# Patient Record
Sex: Female | Born: 1940 | Race: White | Hispanic: No | Marital: Married | Smoking: Never smoker
Health system: Southern US, Community
[De-identification: ages and names within clinical notes are randomized; demographics above are authoritative.]

## PROBLEM LIST (undated history)

## (undated) DIAGNOSIS — E785 Hyperlipidemia, unspecified: Secondary | ICD-10-CM

## (undated) DIAGNOSIS — I10 Essential (primary) hypertension: Secondary | ICD-10-CM

## (undated) DIAGNOSIS — C801 Malignant (primary) neoplasm, unspecified: Secondary | ICD-10-CM

## (undated) DIAGNOSIS — M549 Dorsalgia, unspecified: Secondary | ICD-10-CM

## (undated) DIAGNOSIS — I639 Cerebral infarction, unspecified: Secondary | ICD-10-CM

## (undated) DIAGNOSIS — G4700 Insomnia, unspecified: Secondary | ICD-10-CM

## (undated) DIAGNOSIS — K649 Unspecified hemorrhoids: Secondary | ICD-10-CM

## (undated) DIAGNOSIS — E119 Type 2 diabetes mellitus without complications: Secondary | ICD-10-CM

## (undated) DIAGNOSIS — Z9181 History of falling: Secondary | ICD-10-CM

## (undated) HISTORY — DX: Malignant (primary) neoplasm, unspecified: C80.1

## (undated) HISTORY — DX: Insomnia, unspecified: G47.00

## (undated) HISTORY — DX: Dorsalgia, unspecified: M54.9

## (undated) HISTORY — PX: BLADDER SUSPENSION: SHX72

## (undated) HISTORY — PX: ABDOMINAL HYSTERECTOMY: SHX81

## (undated) HISTORY — DX: Unspecified hemorrhoids: K64.9

## (undated) HISTORY — DX: Hyperlipidemia, unspecified: E78.5

## (undated) HISTORY — DX: History of falling: Z91.81

---

## 1995-05-20 ENCOUNTER — Encounter: Payer: Self-pay | Admitting: Family Medicine

## 1997-07-21 ENCOUNTER — Ambulatory Visit: Admission: RE | Admit: 1997-07-21 | Discharge: 1997-07-21 | Payer: Self-pay | Admitting: Family Medicine

## 2001-11-12 ENCOUNTER — Encounter: Payer: Self-pay | Admitting: Emergency Medicine

## 2001-11-12 ENCOUNTER — Emergency Department (HOSPITAL_COMMUNITY): Admission: EM | Admit: 2001-11-12 | Discharge: 2001-11-13 | Payer: Self-pay | Admitting: Emergency Medicine

## 2002-01-02 ENCOUNTER — Emergency Department (HOSPITAL_COMMUNITY): Admission: EM | Admit: 2002-01-02 | Discharge: 2002-01-03 | Payer: Self-pay | Admitting: Emergency Medicine

## 2002-01-03 ENCOUNTER — Encounter: Payer: Self-pay | Admitting: Emergency Medicine

## 2002-01-04 ENCOUNTER — Emergency Department (HOSPITAL_COMMUNITY): Admission: EM | Admit: 2002-01-04 | Discharge: 2002-01-04 | Payer: Self-pay | Admitting: Emergency Medicine

## 2004-01-24 ENCOUNTER — Ambulatory Visit: Payer: Self-pay | Admitting: Family Medicine

## 2004-02-15 ENCOUNTER — Ambulatory Visit: Payer: Self-pay | Admitting: Family Medicine

## 2004-04-22 ENCOUNTER — Emergency Department (HOSPITAL_COMMUNITY): Admission: EM | Admit: 2004-04-22 | Discharge: 2004-04-22 | Payer: Self-pay | Admitting: Family Medicine

## 2004-04-25 ENCOUNTER — Ambulatory Visit: Payer: Self-pay | Admitting: Internal Medicine

## 2004-06-14 ENCOUNTER — Ambulatory Visit: Payer: Self-pay | Admitting: Family Medicine

## 2004-07-13 ENCOUNTER — Ambulatory Visit: Payer: Self-pay | Admitting: Family Medicine

## 2004-10-11 ENCOUNTER — Ambulatory Visit: Payer: Self-pay | Admitting: Family Medicine

## 2005-11-05 ENCOUNTER — Ambulatory Visit: Payer: Self-pay | Admitting: Family Medicine

## 2005-11-05 LAB — CONVERTED CEMR LAB: Hgb A1c MFr Bld: 9.5 %

## 2006-03-18 ENCOUNTER — Ambulatory Visit: Payer: Self-pay | Admitting: Family Medicine

## 2006-06-17 ENCOUNTER — Ambulatory Visit: Payer: Self-pay | Admitting: Family Medicine

## 2006-06-17 DIAGNOSIS — T887XXA Unspecified adverse effect of drug or medicament, initial encounter: Secondary | ICD-10-CM | POA: Insufficient documentation

## 2006-06-17 DIAGNOSIS — E119 Type 2 diabetes mellitus without complications: Secondary | ICD-10-CM | POA: Insufficient documentation

## 2006-06-19 LAB — CONVERTED CEMR LAB: ALT: 19 units/L (ref 0–40)

## 2006-06-27 ENCOUNTER — Telehealth (INDEPENDENT_AMBULATORY_CARE_PROVIDER_SITE_OTHER): Payer: Self-pay | Admitting: *Deleted

## 2006-11-13 ENCOUNTER — Telehealth: Payer: Self-pay | Admitting: Internal Medicine

## 2006-11-17 ENCOUNTER — Encounter: Payer: Self-pay | Admitting: Family Medicine

## 2006-11-17 DIAGNOSIS — Z8679 Personal history of other diseases of the circulatory system: Secondary | ICD-10-CM | POA: Insufficient documentation

## 2006-11-17 DIAGNOSIS — I10 Essential (primary) hypertension: Secondary | ICD-10-CM | POA: Insufficient documentation

## 2006-11-17 DIAGNOSIS — E785 Hyperlipidemia, unspecified: Secondary | ICD-10-CM | POA: Insufficient documentation

## 2006-11-17 DIAGNOSIS — M549 Dorsalgia, unspecified: Secondary | ICD-10-CM | POA: Insufficient documentation

## 2006-11-25 ENCOUNTER — Ambulatory Visit: Payer: Self-pay | Admitting: Family Medicine

## 2006-11-26 LAB — CONVERTED CEMR LAB
ALT: 24 units/L (ref 0–35)
BUN: 13 mg/dL (ref 6–23)
Basophils Absolute: 0 10*3/uL (ref 0.0–0.1)
Calcium: 9.9 mg/dL (ref 8.4–10.5)
Eosinophils Absolute: 0.1 10*3/uL (ref 0.0–0.6)
Eosinophils Relative: 2.1 % (ref 0.0–5.0)
GFR calc Af Amer: 129 mL/min
GFR calc non Af Amer: 107 mL/min
Hgb A1c MFr Bld: 9.8 % — ABNORMAL HIGH (ref 4.6–6.0)
MCHC: 33.8 g/dL (ref 30.0–36.0)
MCV: 80.1 fL (ref 78.0–100.0)
Platelets: 169 10*3/uL (ref 150–400)
Potassium: 4.4 meq/L (ref 3.5–5.1)
RBC: 4.78 M/uL (ref 3.87–5.11)
Total CHOL/HDL Ratio: 5.2
Triglycerides: 148 mg/dL (ref 0–149)
WBC: 5.3 10*3/uL (ref 4.5–10.5)

## 2006-12-02 ENCOUNTER — Encounter: Admission: RE | Admit: 2006-12-02 | Discharge: 2006-12-02 | Payer: Self-pay | Admitting: Family Medicine

## 2006-12-08 ENCOUNTER — Encounter: Payer: Self-pay | Admitting: Family Medicine

## 2006-12-11 ENCOUNTER — Telehealth (INDEPENDENT_AMBULATORY_CARE_PROVIDER_SITE_OTHER): Payer: Self-pay | Admitting: *Deleted

## 2006-12-17 ENCOUNTER — Ambulatory Visit (HOSPITAL_COMMUNITY): Admission: RE | Admit: 2006-12-17 | Discharge: 2006-12-17 | Payer: Self-pay | Admitting: Family Medicine

## 2006-12-23 ENCOUNTER — Encounter: Admission: RE | Admit: 2006-12-23 | Discharge: 2006-12-23 | Payer: Self-pay | Admitting: Family Medicine

## 2006-12-26 ENCOUNTER — Encounter (INDEPENDENT_AMBULATORY_CARE_PROVIDER_SITE_OTHER): Payer: Self-pay | Admitting: *Deleted

## 2007-01-06 ENCOUNTER — Encounter: Payer: Self-pay | Admitting: Family Medicine

## 2007-01-27 ENCOUNTER — Encounter: Admission: RE | Admit: 2007-01-27 | Discharge: 2007-03-03 | Payer: Self-pay | Admitting: Family Medicine

## 2007-01-27 ENCOUNTER — Encounter: Payer: Self-pay | Admitting: Family Medicine

## 2007-02-19 DIAGNOSIS — I639 Cerebral infarction, unspecified: Secondary | ICD-10-CM

## 2007-02-19 HISTORY — PX: HIP ARTHROPLASTY: SHX981

## 2007-02-19 HISTORY — DX: Cerebral infarction, unspecified: I63.9

## 2007-03-03 ENCOUNTER — Encounter: Payer: Self-pay | Admitting: Family Medicine

## 2007-05-18 ENCOUNTER — Encounter: Payer: Self-pay | Admitting: Family Medicine

## 2007-05-28 ENCOUNTER — Telehealth: Payer: Self-pay | Admitting: Family Medicine

## 2007-06-08 ENCOUNTER — Encounter: Payer: Self-pay | Admitting: Family Medicine

## 2007-06-09 ENCOUNTER — Encounter: Admission: RE | Admit: 2007-06-09 | Discharge: 2007-06-09 | Payer: Self-pay | Admitting: Family Medicine

## 2007-06-23 ENCOUNTER — Encounter: Payer: Self-pay | Admitting: Family Medicine

## 2007-07-07 ENCOUNTER — Telehealth: Payer: Self-pay | Admitting: Family Medicine

## 2007-10-29 ENCOUNTER — Inpatient Hospital Stay (HOSPITAL_COMMUNITY): Admission: EM | Admit: 2007-10-29 | Discharge: 2007-11-02 | Payer: Self-pay | Admitting: Emergency Medicine

## 2007-10-30 ENCOUNTER — Telehealth: Payer: Self-pay | Admitting: Family Medicine

## 2007-11-02 ENCOUNTER — Encounter: Payer: Self-pay | Admitting: Family Medicine

## 2007-11-16 ENCOUNTER — Encounter: Payer: Self-pay | Admitting: Family Medicine

## 2007-12-16 ENCOUNTER — Encounter: Payer: Self-pay | Admitting: Family Medicine

## 2008-01-07 ENCOUNTER — Encounter: Admission: RE | Admit: 2008-01-07 | Discharge: 2008-02-18 | Payer: Self-pay | Admitting: Family Medicine

## 2008-02-23 ENCOUNTER — Encounter: Admission: RE | Admit: 2008-02-23 | Discharge: 2008-03-03 | Payer: Self-pay | Admitting: Family Medicine

## 2008-05-06 ENCOUNTER — Ambulatory Visit: Payer: Self-pay | Admitting: Family Medicine

## 2008-05-06 DIAGNOSIS — S72009A Fracture of unspecified part of neck of unspecified femur, initial encounter for closed fracture: Secondary | ICD-10-CM | POA: Insufficient documentation

## 2008-05-11 LAB — CONVERTED CEMR LAB
ALT: 14 units/L (ref 0–35)
AST: 14 units/L (ref 0–37)
Basophils Relative: 0 % (ref 0–1)
CO2: 23 meq/L (ref 19–32)
Cholesterol: 207 mg/dL — ABNORMAL HIGH (ref 0–200)
Creatinine, Ser: 0.61 mg/dL (ref 0.40–1.20)
Eosinophils Absolute: 0.1 10*3/uL (ref 0.0–0.7)
Glucose, Bld: 176 mg/dL — ABNORMAL HIGH (ref 70–99)
HDL: 34 mg/dL — ABNORMAL LOW (ref >39)
Hemoglobin: 12.4 g/dL (ref 12.0–15.0)
Hgb A1c MFr Bld: 8 % — ABNORMAL HIGH (ref 4.6–6.1)
MCHC: 31.3 g/dL (ref 30.0–36.0)
MCV: 79.5 fL (ref 78.0–100.0)
Monocytes Absolute: 0.5 10*3/uL (ref 0.1–1.0)
Monocytes Relative: 12 % (ref 3–12)
Potassium: 3.9 meq/L (ref 3.5–5.3)
RBC: 4.98 M/uL (ref 3.87–5.11)
Total CHOL/HDL Ratio: 6.1
VLDL: 33 mg/dL (ref 0–40)

## 2008-05-25 ENCOUNTER — Encounter: Payer: Self-pay | Admitting: Family Medicine

## 2008-05-26 ENCOUNTER — Telehealth: Payer: Self-pay | Admitting: Family Medicine

## 2008-06-16 ENCOUNTER — Ambulatory Visit: Payer: Self-pay | Admitting: Internal Medicine

## 2008-06-16 ENCOUNTER — Observation Stay (HOSPITAL_COMMUNITY): Admission: EM | Admit: 2008-06-16 | Discharge: 2008-06-17 | Payer: Self-pay | Admitting: Emergency Medicine

## 2008-06-16 ENCOUNTER — Encounter (INDEPENDENT_AMBULATORY_CARE_PROVIDER_SITE_OTHER): Payer: Self-pay | Admitting: Internal Medicine

## 2008-06-16 ENCOUNTER — Encounter: Payer: Self-pay | Admitting: Family Medicine

## 2008-06-16 ENCOUNTER — Ambulatory Visit: Payer: Self-pay | Admitting: Vascular Surgery

## 2008-06-22 ENCOUNTER — Telehealth (INDEPENDENT_AMBULATORY_CARE_PROVIDER_SITE_OTHER): Payer: Self-pay | Admitting: *Deleted

## 2008-06-22 ENCOUNTER — Telehealth: Payer: Self-pay | Admitting: Family Medicine

## 2008-06-23 ENCOUNTER — Encounter: Payer: Self-pay | Admitting: Family Medicine

## 2008-06-24 ENCOUNTER — Ambulatory Visit: Payer: Self-pay | Admitting: Family Medicine

## 2008-06-24 ENCOUNTER — Telehealth: Payer: Self-pay | Admitting: Family Medicine

## 2008-06-24 DIAGNOSIS — F411 Generalized anxiety disorder: Secondary | ICD-10-CM | POA: Insufficient documentation

## 2008-07-19 ENCOUNTER — Encounter: Payer: Self-pay | Admitting: Family Medicine

## 2008-08-02 ENCOUNTER — Ambulatory Visit: Payer: Self-pay | Admitting: Family Medicine

## 2008-09-13 ENCOUNTER — Ambulatory Visit: Payer: Self-pay | Admitting: Family Medicine

## 2008-09-15 LAB — CONVERTED CEMR LAB
BUN: 11 mg/dL (ref 6–23)
Chloride: 104 meq/L (ref 96–112)
Cholesterol: 128 mg/dL (ref 0–200)
Glucose, Bld: 114 mg/dL — ABNORMAL HIGH (ref 70–99)
Hgb A1c MFr Bld: 7.8 % — ABNORMAL HIGH (ref 4.6–6.1)
Potassium: 4.4 meq/L (ref 3.5–5.3)
Triglycerides: 87 mg/dL (ref <150)

## 2008-09-29 ENCOUNTER — Encounter: Payer: Self-pay | Admitting: Family Medicine

## 2008-10-10 ENCOUNTER — Encounter (INDEPENDENT_AMBULATORY_CARE_PROVIDER_SITE_OTHER): Payer: Self-pay | Admitting: *Deleted

## 2008-11-07 ENCOUNTER — Encounter: Payer: Self-pay | Admitting: Family Medicine

## 2008-11-15 ENCOUNTER — Ambulatory Visit: Payer: Self-pay | Admitting: Family Medicine

## 2009-01-26 ENCOUNTER — Ambulatory Visit: Payer: Self-pay | Admitting: Family Medicine

## 2009-01-27 LAB — CONVERTED CEMR LAB
Albumin: 3.7 g/dL (ref 3.5–5.2)
BUN: 11 mg/dL (ref 6–23)
CO2: 31 meq/L (ref 19–32)
Calcium: 9.2 mg/dL (ref 8.4–10.5)
Chloride: 101 meq/L (ref 96–112)
Cholesterol: 150 mg/dL (ref 0–200)
GFR calc non Af Amer: 106.07 mL/min (ref >60)
HDL: 37 mg/dL — ABNORMAL LOW (ref >39.00)
Triglycerides: 129 mg/dL (ref 0.0–149.0)
VLDL: 25.8 mg/dL (ref 0.0–40.0)

## 2009-02-01 ENCOUNTER — Ambulatory Visit: Payer: Self-pay | Admitting: Family Medicine

## 2009-04-10 ENCOUNTER — Telehealth: Payer: Self-pay | Admitting: Family Medicine

## 2009-04-11 ENCOUNTER — Telehealth: Payer: Self-pay | Admitting: Family Medicine

## 2009-05-03 ENCOUNTER — Ambulatory Visit: Payer: Self-pay | Admitting: Family Medicine

## 2009-05-04 LAB — CONVERTED CEMR LAB
Albumin: 3.7 g/dL (ref 3.5–5.2)
Calcium: 9 mg/dL (ref 8.4–10.5)
Chloride: 105 meq/L (ref 96–112)
Phosphorus: 4.3 mg/dL (ref 2.3–4.6)
Potassium: 4.2 meq/L (ref 3.5–5.1)

## 2009-05-16 ENCOUNTER — Ambulatory Visit: Payer: Self-pay | Admitting: Family Medicine

## 2009-06-21 ENCOUNTER — Telehealth: Payer: Self-pay | Admitting: Family Medicine

## 2009-06-26 ENCOUNTER — Telehealth: Payer: Self-pay | Admitting: Family Medicine

## 2009-07-06 ENCOUNTER — Telehealth: Payer: Self-pay | Admitting: Family Medicine

## 2009-08-15 ENCOUNTER — Ambulatory Visit: Payer: Self-pay | Admitting: Family Medicine

## 2009-08-15 LAB — CONVERTED CEMR LAB
BUN: 14 mg/dL (ref 6–23)
CO2: 32 meq/L (ref 19–32)
Chloride: 105 meq/L (ref 96–112)
Cholesterol: 166 mg/dL (ref 0–200)
GFR calc non Af Amer: 130.7 mL/min (ref >60)
Hgb A1c MFr Bld: 8.2 % — ABNORMAL HIGH (ref 4.6–6.5)
LDL Cholesterol: 105 mg/dL — ABNORMAL HIGH (ref 0–99)
Triglycerides: 132 mg/dL (ref 0.0–149.0)

## 2009-09-11 ENCOUNTER — Ambulatory Visit: Payer: Self-pay | Admitting: Family Medicine

## 2009-10-12 ENCOUNTER — Ambulatory Visit: Payer: Self-pay | Admitting: Family Medicine

## 2009-11-01 ENCOUNTER — Ambulatory Visit: Payer: Self-pay | Admitting: Family Medicine

## 2009-11-01 DIAGNOSIS — R4589 Other symptoms and signs involving emotional state: Secondary | ICD-10-CM

## 2009-11-16 ENCOUNTER — Encounter: Payer: Self-pay | Admitting: Family Medicine

## 2009-11-20 ENCOUNTER — Encounter: Payer: Self-pay | Admitting: Family Medicine

## 2010-03-11 ENCOUNTER — Encounter: Payer: Self-pay | Admitting: Family Medicine

## 2010-03-22 NOTE — Assessment & Plan Note (Signed)
Summary: BP CHECK & COLLECT GLUCOSE RECORD FRM PATIENT / LFW  Nurse Visit   Vital Signs:  Patient profile:   70 year old female Temp:     98.4 degrees F oral Pulse rate:   64 / minute Pulse rhythm:   regular BP sitting:   150 / 82  (right arm) Cuff size:   regular  Vitals Entered By: Linde Gillis CMA Duncan Dull) (October 12, 2009 2:47 PM)  CC: Blood pressure check  Patient is on Cozaar 100mg .  She says she has no complaints of SOB, dizziness, lightheadedness, etc.  She says that she is under a lot of stress at home, her daughter and two grandchildren moved in with her two years ago and it is stressful on her financially.  She brought in a blood glucose log as well which I will put in your IN box.  She says that she needs a urinalysis done via her insurance company.  She says that they cover certain test and would like this done, advised patient that Dr. Milinda Antis would have to order urinalysis before we could do that.  Her phone number is [phone] and she would like to hear back from Dr. Royden Purl nurse tomorrow.  Linde Gillis CMA Duncan Dull)  October 12, 2009 2:52 PM      Allergies: 1)  ! * Fish Oil 2)  ! * Glucotrol 3)  Sulfa 4)  Ibuprofen 5)  Ace Inhibitors 6)  * Avandia 7)  Asa  Orders Added: 1)  Est. Patient Level I [82956] Prescriptions: NORVASC 5 MG TABS (AMLODIPINE BESYLATE) 1 by mouth once daily  #30 x 11   Entered and Authorized by:   Judith Part MD   Signed by:   Lewanda Rife LPN on 21/30/8657   Method used:   Telephoned to ...       [id number]       [address]       [place], [place]  [address]       Ph: [phone] or [phone]       Fax: [phone]   RxID:   [id number]    bp is high - no improvement from visit-- I want to go ahead and add med to bring it down will try norvasc 5 mg (px written on EMR for call in )  then f/u 2 weeks later --will check bp and review sugars and also check whatever urine test her ins wants (? ua or  urine microalbumin?) looks like the majority of high sugars are after she eats if any side eff or problems please let me know   Patient notified as instructed by telephone. Medication phoned [facility] as instructed. Pt scheduled appt with Dr Milinda Antis 11/01/09 at 3:15pm.Rena Isley LPN  October 17, 2009 8:24 AM

## 2010-03-22 NOTE — Assessment & Plan Note (Signed)
Summary: FOLLOW UP ON BP/RI   Vital Signs:  Patient profile:   70 year old female Height:      60 inches Weight:      166.50 pounds BMI:     32.63 Temp:     98.4 degrees F oral Pulse rate:   68 / minute Pulse rhythm:   regular BP sitting:   146 / 84  (left arm) Cuff size:   regular  Vitals Entered By: Lewanda Rife LPN (November 01, 2009 3:13 PM) CC: f/u BP   History of Present Illness: here for f/u of HTN and DM  is feeling good overall   bp stays consistently up - today 146/82, at nurse visit 150/82 and high at last visit   is very stressed-- somewhat  daughter is going through a divorce and they are living with her  things are going to get better     cozaar 100 currently (non tol ace )  needs urine test for insurance  sugars in am good 120s-130 s for the most part  pms higher 140s-180s and much more labile  is doing well with diet -- is very close to diabetic diet -- and eating small amounts  no sweets and watching carbs  exercising 3 days per week   was going to check on coverage of byetta and lantus   is intol of avandia and sulfonurea   diet - is still good/ sticking to DM plan  has not been able to afford eye exam   is still loosing weight - and proud of that - and is exercising at least several times per week      Allergies: 1)  ! * Fish Oil 2)  ! 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 MI or stroke Mother: deceased- DM, dementia Siblings: 5 brothers, 1 with HTN. 5 sisters, 1 has had MI  Social History: Last updated: 11/15/2008 Marital Status: Married Children: 5 Occupation: home non smoker no alcohol regular exercise- including water aerobics   Risk Factors: Smoking Status: quit (11-27-06)  Review of Systems General:  Complains of fatigue; denies loss of appetite and malaise. Eyes:  Denies blurring and eye irritation. CV:  Denies chest pain or discomfort, lightheadness, palpitations, and swelling of feet. Resp:  Denies cough and shortness of breath. GI:  Denies abdominal pain, indigestion, and nausea. GU:  Denies urinary frequency. MS:  Denies muscle aches and cramps. Derm:  Denies lesion(s), poor wound healing, and rash. Neuro:  Denies numbness and tingling. Psych:  Complains of anxiety and depression. Endo:  Denies cold intolerance, excessive thirst, excessive urination, and heat intolerance. Heme:  Denies abnormal bruising and bleeding.  Physical Exam  General:  overweight but generally well appearing  Head:  normocephalic, atraumatic, and no abnormalities observed.   Eyes:  vision grossly intact, pupils equal, pupils round, and pupils reactive to light.  no conjunctival pallor, injection or icterus  Mouth:  pharynx pink and moist.   Neck:  supple with full rom and no masses or thyromegally, no JVD or carotid bruit  Chest Wall:  No deformities, masses, or tenderness noted. Lungs:  Normal respiratory effort, chest expands symmetrically. Lungs are clear to auscultation, no crackles or wheezes. Heart:  Normal rate and regular rhythm. S1 and S2 normal without gallop, murmur, click, rub or other extra sounds. Abdomen:  Bowel sounds positive,abdomen soft and non-tender without masses, organomegaly or hernias noted. no renal bruits  Msk:  No deformity or scoliosis noted of thoracic or lumbar spine.  no  acute joint changes  Skin:  Intact without suspicious lesions or rashes Cervical Nodes:  No lymphadenopathy noted Inguinal Nodes:  No significant adenopathy Psych:  normal affect, talkative and pleasant  is stressed but talkative   Diabetes Management Exam:    Foot Exam (with socks and/or shoes not present):       Sensory-Pinprick/Light touch:          Left medial foot (L-4): normal          Left dorsal foot (L-5): normal          Left lateral foot (S-1): normal          Right medial foot (L-4): normal          Right dorsal foot (L-5): normal          Right lateral foot (S-1): normal       Sensory-Monofilament:          Left foot: normal          Right foot: normal       Inspection:          Left foot: normal          Right foot: normal       Nails:          Left foot: normal          Right foot: normal   Impression & Recommendations:  Problem # 1:  HYPERTENSION (ICD-401.9) Assessment Deteriorated  this is still not optimal  will add spironolactone with caution to watch labs (f/u in a month )  disc increase in urination with this  hope it may help sugar a bit too asked pt to inc exercise to 5 d per week Her updated medication list for this problem includes:    Cozaar 100 Mg Tabs (Losartan potassium) ..... One by mouth once daily    Norvasc 5 Mg Tabs (Amlodipine besylate) .Marland Kitchen... 1 by mouth once daily    Spironolactone 25 Mg Tabs (Spironolactone) .Marland Kitchen... 1 by mouth once daily in am  BP today: 146/84 Prior BP: 150/82 (10/12/2009)  Labs Reviewed: K+: 4.2 (08/15/2009) Creat: : 0.5 (08/15/2009)   Chol: 166 (08/15/2009)   HDL: 34.40 (08/15/2009)   LDL: 105 (08/15/2009)   TG: 132.0 (08/15/2009)  Orders: Prescription Created Electronically [phone])  Problem # 2:  DIABETES MELLITUS, TYPE II (ICD-250.00) Assessment: Unchanged pm sugars are high  rev diet - is ok  need to inc exercise to 5 d per week  will consider addn of byetta or lantus --pt will check with ins  f/u 1 mo    keep checking sugar two times a day  poss aldactone will also dec sugar a bit  Her updated medication list for this problem includes:    Cozaar 100 Mg Tabs (Losartan potassium) ..... One by mouth once daily    Metformin Hcl 1000 Mg Tabs (Metformin hcl) .Marland KitchenMarland KitchenMarland KitchenMarland Kitchen  1 by mouth in am , 1/2 by mouth at lunch, and 1 by mouth in pm    Aspirin 81 Mg Tabs (Aspirin) .Marland Kitchen... Take 1 tablet by mouth once a day  Problem # 3:  HYPERLIPIDEMIA (ICD-272.4) Assessment: Unchanged  urged low sat fat diet  lab at f/u on statin (the one she can afford) Her updated medication list for this problem includes:    Pravachol 40 Mg Tabs (Pravastatin sodium) .Marland Kitchen... 1 by mouth once daily  Labs Reviewed: SGOT: 17 (08/15/2009)   SGPT: 12 (08/15/2009)   HDL:34.40 (08/15/2009), 37.00 (01/26/2009)  LDL:105 (08/15/2009), 87 (16/11/9602)  Chol:166 (08/15/2009), 150 (01/26/2009)  Trig:132.0 (08/15/2009), 129.0 (01/26/2009)  Problem # 4:  STRESS REACTION, ACUTE, WITH EMOTIONAL DISTURBANCE (ICD-308.0) Assessment: New no doubt this raises sugar too  good coping skills  overall handling well - and stress should remit (family problems) soon  Complete Medication List: 1)  Cozaar 100 Mg Tabs (Losartan potassium) .... One by mouth once daily 2)  Metformin Hcl 1000 Mg Tabs (Metformin hcl) .Marland Kitchen.. 1 by mouth in am , 1/2 by mouth at lunch, and 1 by mouth in pm 3)  Plavix 75 Mg Tabs (Clopidogrel bisulfate) .Marland Kitchen.. 1 by mouth once daily 4)  Aspirin 81 Mg Tabs (Aspirin) .... Take 1 tablet by mouth once a day 5)  Alprazolam 0.5 Mg Tabs (Alprazolam) .Marland Kitchen.. 1 by mouth up to once daily as needed severe anxiety/panic attack 6)  Pravachol 40 Mg Tabs (Pravastatin sodium) .Marland Kitchen.. 1 by mouth once daily 7)  Multivitamins Tabs (Multiple vitamin) .... Take 1 tablet by mouth once a day 8)  Norvasc 5 Mg Tabs (Amlodipine besylate) .Marland Kitchen.. 1 by mouth once daily 9)  Spironolactone 25 Mg Tabs (Spironolactone) .Marland Kitchen.. 1 by mouth once daily in am  Patient Instructions: 1)   start spironolactone/ (aldactone) in the am every day 2)  this will bring down blood pressure and make you urinate more  3)  this also may help sugar a little bit 4)  keep checking sugar two times a day ( am fasting , and then take 2  hours after lunch or dinner)  5)  call your insurance to check on price of byetta and lantus insulin and then call me back to tell me which one is affordible  6)  in the meantime send me some sugar readings in 7-14 days  7)  follow up with me in 1 month to check blood pressure on the new medicine  8)  increase exercise to 5 days per week if you can  Prescriptions: SPIRONOLACTONE 25 MG TABS (SPIRONOLACTONE) 1 by mouth once daily in am  #30 x 11   Entered and Authorized by:   Judith Part MD   Signed by:   Judith Part MD on 11/01/2009   Method used:   [facility]ronically to        [facility] [address]       [address]       [place], [place]  [address]       Ph: [phone] or [phone]       Fax: [phone]   RxID:   [id number]   Current Allergies (reviewed today): ! * FISH OIL ! * GLUCOTROL SULFA IBUPROFEN ACE INHIBITORS * AVANDIA ASA

## 2010-03-22 NOTE — Progress Notes (Signed)
Summary: refill requests for cozaar, metformin  Phone Note Refill Request Message from:  Fax from Pharmacy  Refills Requested: Medication #1:  COZAAR 100 MG  TABS one by mouth once daily  Medication #2:  METFORMIN HCL 1000 MG  TABS 1 by mouth in am Faxed form from [facility] is on your shelf.  Initial call taken by: Lowella Petties CMA,  Jul 06, 2009 9:40 AM  Follow-up for Phone Call        did I just do these on 5/9?  Follow-up by: Judith Part MD,  Jul 06, 2009 10:36 AM  Additional Follow-up for Phone Call Additional follow up Details #1::        i refaxed on 07/03/09. I called [facility] at [phone] and spoke with Victorino Dike. She said pt's insurance only covered 30 days at a time. She  would adjust rx and take care of for the pt.Lewanda Rife LPN  Jul 06, 2009 11:56 AM

## 2010-03-22 NOTE — Progress Notes (Signed)
Summary: metformin, cozaar  Phone Note Refill Request Message from:  Fax from Pharmacy  Refills Requested: Medication #1:  METFORMIN HCL 1000 MG  TABS 1 by mouth in am  Medication #2:  COZAAR 100 MG  TABS one by mouth once daily Faxed forms from [facility] are on your shelf.  Initial call taken by: Lowella Petties CMA,  Jun 26, 2009 8:45 AM  Follow-up for Phone Call        form done and in nurse in box  Follow-up by: Judith Part MD,  Jun 26, 2009 9:03 AM  Additional Follow-up for Phone Call Additional follow up Details #1::        Completed forms faxed to [phone] as instructed. At Rena's desk if needed later.Lewanda Rife LPN  Jun 27, 863 10:48 AM     New/Updated Medications: COZAAR 100 MG  TABS (LOSARTAN POTASSIUM) one by mouth once daily METFORMIN HCL 1000 MG  TABS (METFORMIN HCL) 1 by mouth in am , 1/2 by mouth at lunch, and 1 by mouth in pm Prescriptions: COZAAR 100 MG  TABS (LOSARTAN POTASSIUM) one by mouth once daily  #90 x 3   Entered and Authorized by:   Judith Part MD   Signed by:   Lewanda Rife LPN on 78/46/9629   Method used:   Historical   RxID:   [id number] METFORMIN HCL 1000 MG  TABS (METFORMIN HCL) 1 by mouth in am , 1/2 by mouth at lunch, and 1 by mouth in pm  #3 months x 3   Entered and Authorized by:   Judith Part MD   Signed by:   Lewanda Rife LPN on 36/64/4034   Method used:   Historical   RxID:   [id number]   Appended Document: metformin, cozaar refaxed again today to [facility].

## 2010-03-22 NOTE — Assessment & Plan Note (Signed)
Summary: 3 MONTH FOLLOW UP/RBH R/S FROM 3/22   Vital Signs:  Patient profile:   70 year old female Height:      60 inches Weight:      178.50 pounds BMI:     34.99 Temp:     98.3 degrees F oral Pulse rate:   72 / minute Pulse rhythm:   regular BP sitting:   130 / 76  (left arm) Cuff size:   regular  Vitals Entered By: Lewanda Rife LPN (May 16, 2009 8:27 AM)  Serial Vital Signs/Assessments:  Time      Position  BP       Pulse  Resp  Temp     By                     130/70                         Judith Part MD  CC: three month f/u   History of Present Illness: here for f/u of DM and HTN and lipids   is feeling good - no problems other than constipation   wt is down 2 lb bmi 34  lipids stable with last LDL 87- almost to goal/ statin and diet   DM AIC is 8.2 about the same did add back 1/2 metformin at lunch glucotrol causes hypoglycemia at times- stopped  sugar am-- fasting usually are good - 89-160  sugar pm -- 2 h pp - usually lower 120-125  higher in ams at this point   has been eating honey nut cheerios -- did not know that they had sugar when she bought them is watching sugar intake and salt  sticks with wheat breads -- no white products  exercise- walks 1 mi per day -- on video , plans to get outside when able   diet-gets too hungry if she cuts portions   in past edema with avandia   she resists starting more med at this point  will cut portions and also stop the sugar cereal       Allergies: 1)  ! * Fish Oil 2)  ! 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 MI or stroke Mother: deceased- DM, dementia Siblings: 5 brothers, 1 with HTN. 5 sisters, 1 has had MI  Social History: Last updated: 11/15/2008 Marital Status: Married Children: 5 Occupation: home non smoker no alcohol regular exercise- including water aerobics   Risk Factors: Smoking Status: quit (12/09/06)  Review of Systems General:  Complains of fatigue; denies chills, fever, loss of appetite, and malaise. Eyes:  Denies blurring and eye irritation. CV:  Denies chest pain or discomfort, near fainting, palpitations, and shortness of breath with exertion. Resp:  Denies cough and wheezing. GI:  Denies abdominal pain, bloody stools, change in bowel habits, indigestion, and nausea. GU:  Denies dysuria and urinary frequency. MS:  Denies joint redness, joint swelling, muscle aches, and cramps. Derm:  Denies itching, lesion(s), poor wound healing, and rash. Neuro:  Denies numbness and tingling. Psych:  mood is ok . Endo:  Denies cold intolerance, excessive thirst, excessive urination, and heat intolerance. Heme:  Denies abnormal bruising and bleeding.  Physical Exam  General:  overweight but generally well appearing  Head:  normocephalic, atraumatic, and no abnormalities observed.   Eyes:  vision grossly intact, pupils equal, pupils round, and pupils reactive to light.   Mouth:  pharynx pink and moist.   Neck:  supple with full rom and no masses or thyromegally, no JVD or carotid bruit  Lungs:  Normal respiratory effort, chest expands symmetrically. Lungs are clear to auscultation, no crackles or wheezes. Heart:  Normal rate and regular rhythm. S1 and S2 normal without gallop, murmur, click, rub or other extra sounds. Abdomen:  no renal bruits   Msk:  No deformity or scoliosis noted of thoracic or lumbar spine.  no acute joint changes  Pulses:  R and L carotid,radial,femoral,dorsalis pedis and posterior tibial pulses are full and equal bilaterally Extremities:  No clubbing, cyanosis, edema, or deformity noted with normal full range of motion of all joints.   Neurologic:  sensation intact to light touch, gait normal, and DTRs symmetrical and normal.   Skin:  ingrown nail 1st on L foot- no inflammation- cut appropriately Cervical Nodes:  No lymphadenopathy noted Psych:  normal affect, talkative and pleasant   Diabetes Management Exam:    Foot Exam (with socks and/or shoes not present):       Sensory-Pinprick/Light touch:          Left medial foot (L-4): normal          Left dorsal foot (L-5): normal          Left lateral foot (S-1): normal          Right medial foot (L-4): normal          Right dorsal foot (L-5): normal          Right lateral foot (S-1): normal       Sensory-Monofilament:          Left foot: normal          Right foot: normal       Inspection:          Left foot: normal          Right foot: normal       Nails:          Left foot: ingrown          Right foot: normal   Impression & Recommendations:  Problem # 1:  HYPERTENSION (ICD-401.9) Assessment Unchanged  bp imp on second check today continue cozaar and exercise  good work with sodium avoidance Her updated medication list for this problem includes:    Cozaar 100 Mg Tabs (Losartan potassium) ..... One by mouth once daily  BP today: 130/76 Prior BP: 122/78 (02/01/2009)  Labs Reviewed: K+: 4.2 (05/03/2009) Creat: : 0.6 (05/03/2009)   Chol: 150 (01/26/2009)   HDL: 37.00 (01/26/2009)   LDL: 87 (01/26/2009)   TG: 129.0 (01/26/2009)  Problem # 2:  DIABETES MELLITUS, TYPE II (ICD-250.00) Assessment: Unchanged  DM is not well controlled  some wt loss - more by pt's scale non tol glucotrol  on metformin needs to cut portions and cut out sugar  cereal and monitor sugar more freq  pt refuses addn med now- I  feel we may need to try byetta or lantus in the future  she will work on diet/wt loss and get new scale check glucose two times a day  f/u after lab 3 mo  The following medications were removed from the medication list:    Glucotrol Xl 5 Mg Tb24 (Glipizide) .Marland Kitchen... 1 by mouth once  every morning Her updated medication list for this problem includes:    Cozaar 100 Mg Tabs (Losartan potassium) ..... One by mouth once daily    Metformin Hcl 1000 Mg Tabs (Metformin hcl) .Marland Kitchen... 1 by mouth in am , 1/2 by mouth at lunch, and 1 by mouth in pm    Aspirin 81 Mg Tabs (Aspirin) .Marland Kitchen... Take 1 tablet by mouth once a day  Labs Reviewed: Creat: 0.6 (05/03/2009)     Last Eye Exam: diabetic retinopathy (05/25/2008) Reviewed HgBA1c results: 8.2 (05/03/2009)  8.1 (01/26/2009)  Problem # 3:  HYPERLIPIDEMIA (ICD-272.4) Assessment: Unchanged  overall has been fairly controlled - almost to goal disc low sat fat diet  adv to continue statin  lab and f/u 3 mo  Her updated medication list for this problem includes:    Pravachol 40 Mg Tabs (Pravastatin sodium) .Marland Kitchen... 1 by mouth once daily  Labs Reviewed: SGOT: 16 (01/26/2009)   SGPT: 15 (01/26/2009)   HDL:37.00 (01/26/2009), 33 (09/13/2008)  LDL:87 (01/26/2009), 78 (09/13/2008)  Chol:150 (01/26/2009), 128 (09/13/2008)  Trig:129.0 (01/26/2009), 87 (09/13/2008)  Complete Medication List: 1)  Cozaar 100 Mg Tabs (Losartan potassium) .... One by mouth once daily 2)  Metformin Hcl 1000 Mg Tabs (Metformin hcl) .Marland Kitchen.. 1 by mouth in am , 1/2 by mouth at lunch, and 1 by mouth in pm 3)  Mvi  .Marland Kitchen.. 1 by mouth qd 4)  Plavix 75 Mg Tabs (Clopidogrel bisulfate) .Marland Kitchen.. 1 by mouth once daily 5)  Aspirin 81 Mg Tabs (Aspirin) .... Take 1 tablet by mouth once a day 6)  Alprazolam 0.5 Mg Tabs (Alprazolam) .Marland Kitchen.. 1 by mouth up to once daily as needed severe anxiety/panic attack 7)  Pravachol 40 Mg Tabs (Pravastatin sodium)  .Marland Kitchen.. 1 by mouth once daily 8)  Multivitamins Tabs (Multiple vitamin) .... Take 1 tablet by mouth once a day  Patient Instructions: 1)  keep reviewing books for diabetic diet 2)  cut portions by 1/4  3)  keep up exercise -and increase by 10-15 min per day when ready  4)  stop the sugar cereal 5)  only have bedtime snack if sugar in am is 80s or below 6)  check sugar every am fasting  7)  check sugar 2 hours after mid day or evening meal 8)  keep a log and bring it to your next visit  9)  no change in medicines 10)  check fasting labs in 3 months lipid/ast/alt Donnetta Hail /renal 250.0 and then follow up   Current Allergies (reviewed today): ! * FISH OIL ! * GLUCOTROL SULFA IBUPROFEN ACE INHIBITORS * AVANDIA ASA

## 2010-03-22 NOTE — Progress Notes (Signed)
Summary: simvistatin   Phone Note Call from Patient Call back at Home Phone [phone]   Caller: Patient Call For: Judith Part MD Summary of Call: Patient has been taking her Simvistatin for a while now and she says that her hair is starting to fall out and would like something different to replace it. [facility] [place]  Initial call taken by: Melody Comas,  April 10, 2009 1:37 PM  Follow-up for Phone Call        have her check with her insurance to see which "statin" cholesterol meds are preferred so I know what she can afford - thanks   Additional Follow-up for Phone Call Additional follow up Details #1::        Patient notified as instructed by telephone. Lewanda Rife LPN  April 10, 2009 5:06 PM

## 2010-03-22 NOTE — Miscellaneous (Signed)
Summary: CONTROLLED SUBSTANCES CONTRACT  CONTROLLED SUBSTANCES CONTRACT   Imported By: Wilder Glade 09/13/2009 16:40:06  _____________________________________________________________________  External Attachment:    Type:   Image     Comment:   External Document

## 2010-03-22 NOTE — Progress Notes (Signed)
Summary: regarding cholesterol meds  Phone Note Call from Patient Call back at Home Phone [phone]   Caller: Patient Call For: Judith Part MD Summary of Call: Pt had been taking simvastatin but stopped it because of hair loss.  She spoke with her insurance company and they gave her the names of their preferred meds- pravastatin, lovastatin, gemfibrozil.  She would like a written 90 day script of whichever one that she can send to mail order.  Please advise. Initial call taken by: Lowella Petties CMA,  April 11, 2009 12:49 PM  Follow-up for Phone Call        lets try pravastatin printed in put in nurse in box for pickup let me know if not imp Follow-up by: Judith Part MD,  April 11, 2009 1:48 PM  Additional Follow-up for Phone Call Additional follow up Details #1::        Patient notified as instructed when she came by to get rx.  Pt picked up rx at front desk.Lewanda Rife LPN  April 11, 2009 2:45 PM     New/Updated Medications: PRAVACHOL 40 MG TABS (PRAVASTATIN SODIUM) 1 by mouth once daily Prescriptions: PRAVACHOL 40 MG TABS (PRAVASTATIN SODIUM) 1 by mouth once daily  #90 x 3   Entered and Authorized by:   Judith Part MD   Signed by:   Judith Part MD on 04/11/2009   Method used:   Print then Give to Patient   RxID:   [id number]

## 2010-03-22 NOTE — Assessment & Plan Note (Signed)
Summary: FOLLOW UP / LFW   Vital Signs:  Patient profile:   70 year old female Height:      60 inches Weight:      171.50 pounds BMI:     33.61 Temp:     98.1 degrees F oral Pulse rate:   68 / minute Pulse rhythm:   regular BP sitting:   154 / 84  (left arm) Cuff size:   regular  Vitals Entered By: Lewanda Rife LPN (September 11, 2009 2:10 PM) CC: follow-up visit   History of Present Illness: here for f/u of lipids and DM and HTN   has been doing good  went for a week vacation and family reunion    lipids trig 132/ HDL 34 and LDL 105 (this LDL is up from 87) ? from vacation eating may have been eating more cholesterol than she thought   AIC is 8.2- just the same as last time her readings are good -- ams 120s-130s , and not checking 2 hours after a meal  occ checks at night -- occ 150  opthy-- has not made appt for that -- will try to soon/ has not had the money on metformin  intol glucotrol - due to hypoglycemia  intol avandia due to swelling   bp is up today 154/84 rushing to get here -- and thinks that is why it is high did take her med   wt is down 7 more lbs     Allergies: 1)  ! * Fish Oil 2)  ! 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 MI or stroke Mother: deceased- DM, dementia Siblings: 5 brothers, 1 with HTN. 5 sisters, 1 has had MI  Social  History: Last updated: 11/15/2008 Marital Status: Married Children: 5 Occupation: home non smoker no alcohol regular exercise- including water aerobics   Risk Factors: Smoking Status: quit (12/01/06)  Review of Systems General:  Denies fatigue, fever, loss of appetite, and malaise. Eyes:  Denies blurring and eye irritation. CV:  Denies chest pain or discomfort, palpitations, shortness of breath with exertion, and swelling of feet. Resp:  Denies cough, shortness of breath, and wheezing. GI:  Denies indigestion, nausea, and vomiting. GU:  Denies urinary frequency. MS:  Denies muscle aches and cramps. Derm:  Denies itching, lesion(s), poor wound healing, and rash. Neuro:  Denies headaches, numbness, and tingling. Psych:  mood is ook . Endo:  Denies cold intolerance, excessive thirst, excessive urination, and heat intolerance. Heme:  Denies abnormal bruising and bleeding.  Physical Exam  General:  overweight but generally well appearing  Head:  normocephalic, atraumatic, and no abnormalities observed.   Eyes:  vision grossly intact, pupils equal, pupils round, and pupils reactive to light.   Neck:  supple with full rom and no masses or thyromegally, no JVD or carotid bruit  Lungs:  Normal respiratory effort, chest expands symmetrically. Lungs are clear to auscultation, no crackles or wheezes.  Heart:  Normal rate and regular rhythm. S1 and S2 normal without gallop, murmur, click, rub or other extra sounds. Abdomen:  Bowel sounds positive,abdomen soft and non-tender without masses, organomegaly or hernias noted. no renal bruits  Msk:  No deformity or scoliosis noted of thoracic or lumbar spine.  no acute joint changes  Pulses:  R and L carotid,radial,femoral,dorsalis pedis and posterior tibial pulses are full and equal bilaterally Extremities:  No clubbing, cyanosis, edema, or deformity noted with normal full range of motion of all joints.   Neurologic:  sensation intact to light  touch, gait normal, and DTRs symmetrical and normal.   Skin:  Intact without suspicious lesions or rashes Cervical Nodes:  No lymphadenopathy noted Inguinal Nodes:  No significant adenopathy Psych:  normal affect, talkative and pleasant    Diabetes Management Exam:    Foot Exam (with socks and/or shoes not present):       Sensory-Pinprick/Light touch:          Left medial foot (L-4): normal          Left dorsal foot (L-5): normal          Left lateral foot (S-1): normal          Right medial foot (L-4): normal          Right dorsal foot (L-5): normal          Right lateral foot (S-1): normal       Sensory-Monofilament:          Left foot: normal          Right foot: normal       Inspection:          Left foot: normal          Right foot: normal       Nails:          Left foot: normal          Right foot: normal   Impression & Recommendations:  Problem # 1:  ANXIETY, MILD (ICD-300.00) Assessment Unchanged refilled alprazolam for rare panic attacks- uses sparingly and warned of sedation Her updated medication list for this problem includes:    Alprazolam 0.5 Mg Tabs (Alprazolam) .Marland Kitchen... 1 by mouth up to once daily as needed severe anxiety/panic attack  Problem # 2:  HYPERTENSION (ICD-401.9) Assessment: Deteriorated bp up today - unusual  pt thinks is from stressful day and rushing to get here  will sched nurse visit in 1 mo to re check that - and then make plan Her updated medication list for this problem includes:    Cozaar 100 Mg Tabs (Losartan potassium) ..... One by mouth once daily  Problem # 3:  HYPERLIPIDEMIA (ICD-272.4) Assessment: Deteriorated  chol up a bit  rev low chol diet in detail  given handout from [facility]  plan lab and f/u after nurse visit upcoming  Her updated medication list for this problem includes:    Pravachol 40 Mg Tabs (Pravastatin sodium) .Marland Kitchen... 1 by mouth once daily  Labs Reviewed: SGOT: 17 (08/15/2009)   SGPT: 12 (08/15/2009)   HDL:34.40  (08/15/2009), 37.00 (01/26/2009)  LDL:105 (08/15/2009), 87 (91/47/8295)  Chol:166 (08/15/2009), 150 (01/26/2009)  Trig:132.0 (08/15/2009), 129.0 (01/26/2009)  Problem # 4:  DIABETES MELLITUS, TYPE II (ICD-250.00) Assessment: Unchanged  this is not imp good fasting sugars- but suspect PP must be high adv pt to check glucose on good diet two times a day for 1 mo and give to nurse in 1 mo  for my review disc starting byetta or lantus -- pt will check on price of these unfortunately intol of sulfonurea and avandia  pt again will get eye exam when she can afford the copay Her updated medication list for this problem includes:    Cozaar 100 Mg Tabs (Losartan potassium) ..... One by mouth once daily    Metformin Hcl 1000 Mg Tabs (Metformin hcl) .Marland Kitchen... 1 by mouth in am , 1/2 by mouth at lunch, and 1 by mouth in pm    Aspirin 81 Mg Tabs (Aspirin) .Marland Kitchen... Take 1 tablet by mouth once a day  Labs Reviewed: Creat: 0.5 (08/15/2009)     Last Eye Exam: diabetic retinopathy (05/25/2008) Reviewed HgBA1c results: 8.2 (08/15/2009)  8.2 (05/03/2009)  Complete Medication List: 1)  Cozaar 100 Mg Tabs (Losartan potassium) .... One by mouth once daily 2)  Metformin Hcl 1000 Mg Tabs (Metformin hcl) .Marland Kitchen.. 1 by mouth in am , 1/2 by mouth at lunch, and 1 by mouth in pm 3)  Plavix 75 Mg Tabs (Clopidogrel bisulfate) .Marland Kitchen.. 1 by mouth once daily 4)  Aspirin 81 Mg Tabs (Aspirin) .... Take 1 tablet by mouth once a day 5)  Alprazolam 0.5 Mg Tabs (Alprazolam) .Marland Kitchen.. 1 by mouth up to once daily as needed severe anxiety/panic attack 6)  Pravachol 40 Mg Tabs (Pravastatin sodium) .Marland Kitchen.. 1 by mouth once daily 7)  Multivitamins Tabs (Multiple vitamin) .... Take 1 tablet by mouth once a day  Patient Instructions: 1)  you can raise your HDL (good cholesterol) by increasing exercise and eating omega 3 fatty acid supplement like fish oil or flax seed oil over the counter 2)  you can lower LDL (bad cholesterol) by limiting saturated fats  in diet like red meat, fried foods, egg yolks, fatty breakfast meats, high fat dairy products and shellfish  3)  don't forget to schedule your eye exam when you can afford it  4)  check your sugar two times a day -- in am fasting and then 2 hours after a meal (lunch or dinner) 5)  schedule nurse visit in 1 month -- and at that time give her your glucose record so I can review it and recommend what to do- and what kind of follow up to plan  6)  in the meantime check with your insurance about cost of 2 different medicines -- Byetta and lantus insulin  Prescriptions: ALPRAZOLAM 0.5 MG TABS (ALPRAZOLAM) 1 by mouth up to once daily as needed severe anxiety/panic attack  #15 x 1   Entered and Authorized by:   Judith Part MD   Signed by:   Judith Part MD on 09/11/2009   Method used:   Print then Give to Patient   RxID:   [id number]   Current Allergies (reviewed today): ! * FISH OIL ! * GLUCOTROL SULFA IBUPROFEN ACE INHIBITORS * AVANDIA ASA

## 2010-03-22 NOTE — Miscellaneous (Signed)
Summary: Glucose Log Brought by Patient  Glucose Log Brought by Patient   Imported By: Lanelle Bal 10/25/2009 12:25:07  _____________________________________________________________________  External Attachment:    Type:   Image     Comment:   External Document

## 2010-03-22 NOTE — Progress Notes (Signed)
Summary: refill request for pravastatin, plavix  Phone Note Refill Request Message from:  Fax from Pharmacy  Refills Requested: Medication #1:  PRAVACHOL 40 MG TABS 1 by mouth once daily  Medication #2:  PLAVIX 75 MG TABS 1 by mouth once daily Faxed requests from [facility] is on your shelf.  Initial call taken by: Lowella Petties CMA,  Jun 21, 2009 2:32 PM  Follow-up for Phone Call        form done and in nurse in box  Follow-up by: Judith Part MD,  Jun 21, 2009 3:42 PM  Additional Follow-up for Phone Call Additional follow up Details #1::        comopleted forms faxed to [phone].Lewanda Rife LPN  Jun 21, 4780 5:09 PM     New/Updated Medications: PLAVIX 75 MG TABS (CLOPIDOGREL BISULFATE) 1 by mouth once daily Prescriptions: PRAVACHOL 40 MG TABS (PRAVASTATIN SODIUM) 1 by mouth once daily  #90 x 3   Entered and Authorized by:   Judith Part MD   Signed by:   Lewanda Rife LPN on 95/62/1308   Method used:   Historical   RxID:   [id number] PLAVIX 75 MG TABS (CLOPIDOGREL BISULFATE) 1 by mouth once daily  #90 x 3   Entered and Authorized by:   Judith Part MD   Signed by:   Lewanda Rife LPN on 24/40/1027   Method used:   Historical   RxID:   [id number] PRAVACHOL 40 MG TABS (PRAVASTATIN SODIUM) 1 by mouth once daily  #30 x 11   Entered and Authorized by:   Judith Part MD   Signed by:   Lowella Petties CMA on 06/21/2009   Method used:   Historical   RxID:   [id number]

## 2010-03-22 NOTE — Letter (Signed)
Summary: Results Follow up Letter  [place] [facility]  [address] [place], [place] [address]   Phone: [phone]  Fax: [phone]    11/20/2009 MRN: [id number]    Rachel Benton [address] [place], [place]  [address]    Dear Ms. Vogl,  The following are the results of your recent test(s):  Test         Result    Pap Smear:        Normal _____  Not Normal _____ Comments: ______________________________________________________ Cholesterol: LDL(Bad cholesterol):         Your goal is less than:         HDL (Good cholesterol):       Your goal is more than: Comments:  ______________________________________________________ Mammogram:        Normal __X___  Not Normal _____ Comments:Repeat in one year.   ___________________________________________________________________ Hemoccult:        Normal _____  Not normal _______ Comments:    _____________________________________________________________________ Other Tests:    We routinely do not discuss normal results over the telephone.  If you desire a copy of the results, or you have any questions about this information we can discuss them at your next office visit.   Sincerely,    Idamae Schuller Tower,MD  MT/ri

## 2010-05-17 ENCOUNTER — Other Ambulatory Visit: Payer: Self-pay | Admitting: Family Medicine

## 2010-05-18 NOTE — Telephone Encounter (Signed)
Pt needs to call our office for appt.

## 2010-05-25 ENCOUNTER — Other Ambulatory Visit: Payer: Self-pay | Admitting: Family Medicine

## 2010-05-28 ENCOUNTER — Other Ambulatory Visit: Payer: Self-pay | Admitting: Family Medicine

## 2010-05-29 NOTE — Telephone Encounter (Signed)
Pt needs to call for appt. 

## 2010-05-30 LAB — CBC
HCT: 35.2 % — ABNORMAL LOW (ref 36.0–46.0)
Hemoglobin: 11.6 g/dL — ABNORMAL LOW (ref 12.0–15.0)
MCHC: 32.7 g/dL (ref 30.0–36.0)
MCV: 79.5 fL (ref 78.0–100.0)
MCV: 80.1 fL (ref 78.0–100.0)
Platelets: 146 10*3/uL — ABNORMAL LOW (ref 150–400)
Platelets: 215 10*3/uL (ref 150–400)
RBC: 4.42 MIL/uL (ref 3.87–5.11)
RBC: 5.07 MIL/uL (ref 3.87–5.11)
RDW: 16.1 % — ABNORMAL HIGH (ref 11.5–15.5)
WBC: 4.6 10*3/uL (ref 4.0–10.5)
WBC: 6.2 10*3/uL (ref 4.0–10.5)

## 2010-05-30 LAB — COMPREHENSIVE METABOLIC PANEL
ALT: 22 U/L (ref 0–35)
AST: 21 U/L (ref 0–37)
Calcium: 9.2 mg/dL (ref 8.4–10.5)
Creatinine, Ser: 0.56 mg/dL (ref 0.4–1.2)
GFR calc Af Amer: >60 mL/min (ref >60)
Sodium: 137 mEq/L (ref 135–145)
Total Protein: 6.6 g/dL (ref 6.0–8.3)

## 2010-05-30 LAB — CK TOTAL AND CKMB (NOT AT ARMC)
CK, MB: 4.8 ng/mL — ABNORMAL HIGH (ref 0.3–4.0)
Relative Index: INVALID (ref 0.0–2.5)
Total CK: 112 U/L (ref 7–177)
Total CK: 97 U/L (ref 7–177)

## 2010-05-30 LAB — HEPARIN LEVEL (UNFRACTIONATED)
Heparin Unfractionated: 0.24 IU/mL — ABNORMAL LOW (ref 0.30–0.70)
Heparin Unfractionated: 0.47 IU/mL (ref 0.30–0.70)
Heparin Unfractionated: <0.1 IU/mL — ABNORMAL LOW (ref 0.30–0.70)

## 2010-05-30 LAB — DIFFERENTIAL
Lymphocytes Relative: 22 % (ref 12–46)
Lymphs Abs: 1.4 10*3/uL (ref 0.7–4.0)
Monocytes Relative: 9 % (ref 3–12)
Neutro Abs: 4 10*3/uL (ref 1.7–7.7)
Neutrophils Relative %: 65 % (ref 43–77)

## 2010-05-30 LAB — GLUCOSE, CAPILLARY
Glucose-Capillary: 209 mg/dL — ABNORMAL HIGH (ref 70–99)
Glucose-Capillary: 211 mg/dL — ABNORMAL HIGH (ref 70–99)
Glucose-Capillary: 220 mg/dL — ABNORMAL HIGH (ref 70–99)
Glucose-Capillary: 221 mg/dL — ABNORMAL HIGH (ref 70–99)
Glucose-Capillary: 237 mg/dL — ABNORMAL HIGH (ref 70–99)
Glucose-Capillary: 250 mg/dL — ABNORMAL HIGH (ref 70–99)

## 2010-05-30 LAB — RAPID URINE DRUG SCREEN, HOSP PERFORMED
Barbiturates: NOT DETECTED
Benzodiazepines: NOT DETECTED
Cocaine: NOT DETECTED
Opiates: NOT DETECTED

## 2010-05-30 LAB — CARDIAC PANEL(CRET KIN+CKTOT+MB+TROPI)
CK, MB: 2.9 ng/mL (ref 0.3–4.0)
CK, MB: 3.7 ng/mL (ref 0.3–4.0)
Relative Index: INVALID (ref 0.0–2.5)
Total CK: 64 U/L (ref 7–177)
Troponin I: 0.1 ng/mL — ABNORMAL HIGH (ref 0.00–0.06)
Troponin I: 0.11 ng/mL — ABNORMAL HIGH (ref 0.00–0.06)

## 2010-05-30 LAB — URINALYSIS, ROUTINE W REFLEX MICROSCOPIC
Hgb urine dipstick: NEGATIVE
Ketones, ur: NEGATIVE mg/dL
Protein, ur: NEGATIVE mg/dL
Urobilinogen, UA: 0.2 mg/dL (ref 0.0–1.0)

## 2010-05-30 LAB — HOMOCYSTEINE: Homocysteine: 22.9 umol/L — ABNORMAL HIGH (ref 4.0–15.4)

## 2010-05-30 LAB — POCT I-STAT, CHEM 8
Creatinine, Ser: 0.7 mg/dL (ref 0.4–1.2)
Hemoglobin: 14.6 g/dL (ref 12.0–15.0)
Sodium: 138 mEq/L (ref 135–145)
TCO2: 29 mmol/L (ref 0–100)

## 2010-05-30 LAB — URINE CULTURE

## 2010-05-30 LAB — PROTIME-INR
Prothrombin Time: 13.8 seconds (ref 11.6–15.2)
Prothrombin Time: 14.8 seconds (ref 11.6–15.2)

## 2010-05-30 LAB — ETHANOL: Alcohol, Ethyl (B): <5 mg/dL (ref 0–10)

## 2010-05-30 LAB — LIPID PANEL
Cholesterol: 142 mg/dL (ref 0–200)
HDL: 31 mg/dL — ABNORMAL LOW (ref >39)
Total CHOL/HDL Ratio: 4.6 RATIO

## 2010-05-30 LAB — TROPONIN I: Troponin I: 0.17 ng/mL — ABNORMAL HIGH (ref 0.00–0.06)

## 2010-05-30 LAB — APTT
aPTT: 44 seconds — ABNORMAL HIGH (ref 24–37)
aPTT: 50 seconds — ABNORMAL HIGH (ref 24–37)

## 2010-07-03 NOTE — Discharge Summary (Signed)
Rachel Benton, Rachel Benton                 ACCOUNT NO.:  [id number]   MEDICAL RECORD NO.:  [id number]          PATIENT TYPE:  INP   LOCATION:  1401                         FACILITY:  [facility]   PHYSICIAN:  Rosalyn Gess. Norins, MD  DATE OF BIRTH:  [birth year]   DATE OF ADMISSION:  06/15/2008  DATE OF DISCHARGE:  06/17/2008                               DISCHARGE SUMMARY   ADMITTING DIAGNOSES:  1. Transient ischemic attack versus stroke.  2. Diabetes.  3. Hypertension.  4. Hyperlipidemia.   DISCHARGE DIAGNOSES:  1. Transient ischemic attack versus stroke.  2. Diabetes.  3. Hypertension.  4. Hyperlipidemia.   CONSULTANTS:  None.   PROCEDURES:  1. CT of the brain without contrast which showed no acute abnormality.  2. Chest x-ray at admission which showed cardiomegaly but no acute      infiltrates or other abnormalities.  3. MRI of the brain which revealed a sub-centimeter right thalamic      stroke.  4. MRA which showed diffuse atherosclerotic vascular disease but no      occlusive disease.   HISTORY OF THE PRESENT ILLNESS:  The patient is a 70 year old woman with  diabetes, hypertension, hyperlipidemia who at 9 p.m. on the night of  admission developed left facial tingling and numbness followed by left  facial droop, left arm and then left leg weakness.  Her symptoms lasted  for about 2 hours and resolved.  She presented to the emergency  department because of her symptoms and was subsequently admitted.  Please see the admission H and P for past medical history, family  history and social history.   HOSPITAL COURSE:  The patient did have diagnostic studies as noted  above.  She also had a 2D echo, results pending at the time of discharge  dictation.  The patient was seen by PT and OT who felt the patient did  relatively well but would benefit from outpatient physical therapy for  further fall prevention.  No followup OT was recommended.  Through the  hospital course the patient  remained stable.  She had no recurrence of  her symptoms.  She had been fully anticoagulated on heparin.  At this  point with her evaluation being complete, it is felt that she is stable  and ready for discharge home on anticoagulation therapy using Plavix and  aspirin.  Home health will be arranged for PT for fall prevention and  training.   The patient had mildly elevated troponins at the time of admission at  0.14 and 0.17.  She was seen in consultation by [facility] and  [facility] who opined that they felt her rise in and enzymes was related  more to her stroke and not truly a cardiac event.  They did suggest that  the patient be continued on beta blocker, the nitroglycerin as needed.  Echo was ordered.  Carotid Doppler was ordered.  The carotid Dopplers  did reveal 68% occlusion or possibly vessel tortuosity on the right with  no evidence of significant ICA stenosis on the left.  They further  recommended that the patient be  considered for outpatient risk  stratification with either nuclear stress study or cath  The patient had  no chest pain or chest discomfort during her hospital stay and did  remain stable.   Diabetes.  The patient remained stable.  Blood sugars were controlled.  Hemoglobin A1c of 8.2%, indicating poor control.   The patient's evaluation being completed with stroke being identified,  with her being stable for 24+ hours, with her being cleared by PT and  OT, she is at this time ready for discharge home.   DISCHARGE EXAMINATION:  Temperature 97.6, blood pressure 129/69, heart  rate 61, respirations 18, O2 sat is 98%.  GENERAL APPEARANCE:  This is  an obese Caucasian woman sitting in a chair in no acute distress.  HEENT EXAM:  Was unremarkable.  CHEST:  Clear.  CARDIOVASCULAR:  2+ radial pulse.  Her precordium was quiet.  She had a  regular rate and rhythm without murmurs.  ABDOMEN:  Massively obese, soft, no guarding or rebound was noted.  NEUROLOGIC  EXAM:  Patient is awake, alert, she is oriented to person,  place, time and context.  Her speech is clear.  She is able to repeat  rapid riddles with no stumbling.  Cranial nerves II-XII revealed the  patient to have normal facial symmetry and muscle movement.  Extraocular  muscles were intact.  Pupils were equal.  Funduscopic exam deferred.  Motor strength:  The patient is able to move of all her extremities to  command.  She has good grip strength.  She was not stood or walked, but  this was done by physical therapy and she was felt to be stable.  No  further examination conducted.   FINAL LABORATORY:  CBC on the 30th with a white count of 4600 with  hemoglobin 11.6 g, platelet count 146,000.  The patient did have  multiple cardiac panels that were all negative.  Hemoglobin A1c as noted  was 8.2%.  Lipid profile with a cholesterol of 142, triglycerides of 58,  LDL was 99, HDL was 31.  Last comprehensive metabolic panel from June 16, 2008 with a sodium of 137, potassium 3.9, chloride 101, CO2 31, BUN  of 8, creatinine of 0.56, glucose was 256.   DISPOSITION:  The patient will be discharged home with her family.  She  will resume her home medications including glipizide ER 5 mg daily,  metformin 1000 mg t.i.d., Zocor 40 mg daily, multivitamin 1 tablet  daily, losartan 100 mg daily.  The patient will be started on Plavix 75  mg daily and aspirin 81 mg daily as new medications.  Of note,  cardiology has suggested starting the patient on Norvasc, but will defer  to either the patient's primary care doctor, Dr. Milinda Antis, or to cardiology  when they see the patient in followup.   The patient will be scheduled to see Dr. Milinda Antis in approximately 1 week  to 10 days.  The patient will need to call [facility] to schedule a follow-up appointment with her primary  cardiologist.   CONDITION AT TIME OF DISCHARGE DICTATION:  Stable and improved.      Rosalyn Gess Norins, MD   Electronically Signed     MEN/MEDQ  D:  06/17/2008  T:  06/17/2008  Job:  [id number]   cc:   Marne A. Tower, MD  [address] [place], [place] [address]   [facility]

## 2010-07-03 NOTE — Op Note (Signed)
Rachel Benton, TRUSS NO.:  [id number]   MEDICAL RECORD NO.:  [id number]          PATIENT TYPE:  INP   LOCATION:  1621                         FACILITY:  [facility]   PHYSICIAN:  Eulas Post, MD    DATE OF BIRTH:  [birth year]   DATE OF PROCEDURE:  10/29/2007  DATE OF DISCHARGE:                               OPERATIVE REPORT   PREOPERATIVE DIAGNOSIS:  Left intertrochanteric hip fracture.   POSTOPERATIVE DIAGNOSIS:  Left intertrochanteric hip fracture.   OPERATIVE PROCEDURE:  Left hip intramedullary nailing.   ANESTHESIA:  General.   ESTIMATED BLOOD LOSS:  Is 50 mL.   OPERATIVE IMPLANTS:  Synthes 11 x 130 degree titanium cannulated  trochanteric fixation nail, size 340 mm left with a size 105 mm helical  blade.   PREOPERATIVE INDICATIONS:  [name] is a 70 year old woman who  has diabetes and obesity and a mechanical fall and has an  intertrochanteric hip fracture.  She elected to undergo the above named  procedures.  The risks, benefits and alternatives were discussed with  her preoperatively including but not limited to the risks of infection,  bleeding, nerve injury, malunion, nonunion, hardware prominence,  hardware failure, periprosthetic fracture, the need for revision  surgery, lungs, cardiopulmonary complications, blood clots among others  and she is willing to proceed.   OPERATIVE PROCEDURE:  The patient was brought to the operating room and  placed in supine position.  General anesthesia was administered.  One  gram intravenous Ancef was given.  The left lower extremity was reduced  and held in traction.  All prominences and skin was padded.  C-arm was  utilized.  The left leg was prepped and draped in the usual sterile  fashion.  Incision was made proximally to the greater trochanter.  The  fracture had satisfactory reduction and a guide pin was introduced.  The  proximal femur was opened with the reamer and then we placed our  appropriate  length nail after measuring.  Confirmation on C-arm was made  on AP and lateral views.  We then placed our helical blade.  We had some  difficulty getting into the center/center position, as we were placing  on the AP view in order to get to the center of the head we were too  close to the inferior portion of the neck and risked blowout of the  neck.  Therefore we accepted slight superior position on the AP view  with a center/center view on the lateral.  Multiple attempts were made  in order to try and optimize position and ultimately we compromised with  an optimal position in the head without risking blowout of the neck by  the helical blade.  Once all hardware was in position we took our final  x-rays and irrigated the wound copiously and closed the deep tissue with  0 Vicryl  followed by 3-0 followed by Monocryl for the skin and Steri-Strips.  Sterile gauze was applied.  The patient was awakened and returned to the  PACU in stable satisfactory condition.  There were no complications.  The  patient tolerated the procedure well.      Eulas Post, MD  Electronically Signed     JPL/MEDQ  D:  10/29/2007  T:  10/30/2007  Job:  [id number]

## 2010-07-03 NOTE — H&P (Signed)
NAMESHERMAN, LIPUMA                 ACCOUNT NO.:  [id number]   MEDICAL RECORD NO.:  [id number]          PATIENT TYPE:  INP   LOCATION:  1401                         FACILITY:  [facility]   PHYSICIAN:  Michiel Cowboy, MDDATE OF BIRTH:  [birth year]   DATE OF ADMISSION:  06/15/2008  DATE OF DISCHARGE:                              HISTORY & PHYSICAL   PRIMARY CARE PHYSICIAN:  Marne A. Tower, M.D.   CHIEF COMPLAINT:  Left facial and lower extremity as well as upper  extremity weakness.   HISTORY OF PRESENT ILLNESS:  The patient is a 70 year old female with  history of diabetes, hypertension, hyperlipidemia who at 9 p.m.  developed left facial tingling and numbness, a left facial droop, left  arm and then left leg weakness.  This has lasted for two hours and  resolved.  The patient presented to the emergency department __________  for her symptoms.  The patient denies any initial chest pain.  No  shortness of breath, no fevers, no chills, no nausea, no vomiting, no  constipation and no diarrhea.   REVIEW OF SYSTEMS:  Completed and unremarkable except for above-  mentioned symptoms.  Her blood sugars are usually well controlled but  today have been in the upper 200s.  She had been taking her medications.  I do not see any aspirin on her list.  Review of systems negative except  for HPI.   PAST MEDICAL HISTORY:  1. Diabetes.  2. Hypertension.  3. Hyperlipidemia.  4. History of right hip fracture in 2009.  5. There is a question of whether she had a TIA in 2003, but she is      not sure about it.  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   FAMILY HISTORY:  Noncontributory.   SOCIAL HISTORY:  The patient is does not smoke, drink or abuse drugs.  Lives at home with daughter, husband and grandchildren.   ALLERGIES:  SULFA.   MEDICATIONS:  1. Glipizide ER 4 mg daily.  2. Metformin 500 mg three  times a day.  3. Simvastatin 40 mg daily.  4. Losartan 100 mg a day.   PHYSICAL EXAMINATION:  VITAL SIGNS:  Temperature 98.5, blood pressure  173/69, pulse 84, respirations 13, oxygen saturation 97% on room air.  GENERAL:  The patient appears to be in no acute distress.  HEENT:  Head atraumatic.  Mucous membranes moist.  LUNGS:  Clear to auscultation bilaterally.  HEART:  Regular rate and rhythm.  No murmur, rub, or gallops.  ABDOMEN:  Soft, nontender, nondistended.  Slightly obese.  LOWER EXTREMITIES:  Without clubbing, cyanosis or edema.  NEUROLOGIC:  Strength 5/5 in all four extremities.  Cranial nerves II-  XII intact.  Neurology appears intact at this point.  SKIN: Clean and dry, intact.   LABORATORY DATA:  WBC 6.2, hemoglobin 14.6, sodium 130, potassium 3.8,  creatinine 0.7, glucose 211.  Urinalysis negative.  CT scan of her head  showed no acute findings, but there  is old evidence of old right  cerebellar infarct as well as old lacunar infarct.  Chest x-ray showed  cardiomegaly with no acute changes.  EKG shows normal sinus rhythm, with  premature ventricular complexes; otherwise unremarkable, no ischemia or  infarction.   ASSESSMENT/PLAN:  This is a 70 year old female now with possibly  transient ischemic attack.  1. Transient ischemic attack.  The patient has quite extensive risk      factors.  Will admit for further work-up.  Will get an MRI/MRA,      carotid Dopplers, 2-D echo.  May need urology consult.  Consider      starting her on Aggrenox right now or at least starting her on      aspirin.  2. Diabetes.  Will hold metformin and glipizide.  Will do sliding      scale for now as well as Lantus while in house.  This could be      switched back to her home medications when she gets out of here.  3. Hypertension.  Continue Losartan.  4. Prophylaxis.  Protonix, SCDs.  5. Will check fasting lipid panel and hemoglobin and homocystine level      for further risk  stratification.      Michiel Cowboy, MD  Electronically Signed     AVD/MEDQ  D:  06/16/2008  T:  06/16/2008  Job:  [id number]

## 2010-07-03 NOTE — H&P (Signed)
Rachel Benton, CARRANZA NO.:  [id number]   MEDICAL RECORD NO.:  [id number]          PATIENT TYPE:  INP   LOCATION:  1621                         FACILITY:  [facility]   PHYSICIAN:  Eulas Post, MD    DATE OF BIRTH:  1940/08/20   DATE OF ADMISSION:  10/28/2007  DATE OF DISCHARGE:                              HISTORY & PHYSICAL   CHIEF COMPLAINT:  Left hip pain.   HISTORY:  Mrs. Lorella Gomez is a 70 year old woman who had a mechanical  fall down steps and had acute onset left-sided hip pain.  The pain was  located around the groin and the buttock area.  She rates it as moderate  to severe.  She denies other injuries.  She was unable to bear weight.  She was brought to the emergency room by ambulance.  Pain medications  make it better and activity makes it worse.   PAST MEDICAL HISTORY:  Hypertension, diabetes, and hypercholesterolemia.   FAMILY HISTORY:  Father died of a heart attack at age 68.   SOCIAL HISTORY:  Nonsmoker and lives with husband and daughter.   REVIEW OF SYSTEMS:  A 14-point review of systems was performed and was  otherwise negative with the exception of musculoskeletal as above.   PHYSICAL EXAM:  GENERAL:  She is lying in bed in no acute distress and  comfortable.  Alert and oriented.  NECK:  Full range of motion with no radiculopathy.  LYMPHATIC:  No cervical or axillary lymphadenopathy.  OROPHARYNX:  Clear.  EYES:  Extraocular movements intact.  GASTROINTESTINAL:  Abdomen is soft with no hepatosplenomegaly.  CARDIAC:  She has a regular rate and rhythm but slightly tachycardic.  Her lower extremities do not have significant edema.  RESPIRATORY:  She has no increase use of accessory musculature and no  evidence for cyanosis.  PSYCHIATRIC:  Her mood and affect are appropriate.  Her judgment and  insight are intact.  MUSCULOSKELETAL:  Her left lower extremity is shortened and externally  rotated.  She has pain to palpation over the left hip.   EHL and FHL are  firing bilaterally.  NEUROLOGIC:  Sensation is intact throughout both of her lower  extremities.   LABORATORY DATA:  Hemoglobin 12, potassium 3.4, creatinine 0.6 and INR  1.  Chest x-ray does not demonstrate any acute abnormalities.  X-rays of  her pelvis demonstrate an intertrochanteric left hip fracture.   IMPRESSION:  Left intertrochanteric hip fracture with diabetes and  hypercholesterolemia.   PLAN:  Admit, n.p.o., place in 5 pounds of traction, plan for  intramedullary nailing.  Risks, benefits, and alternatives were  discussed at length with her and she is willing to proceed.  We have  also discussed blood transfusions and she is willing if needed.  We will  plan to proceed to the operating room later today.      Eulas Post, MD  Electronically Signed     JPL/MEDQ  D:  10/29/2007  T:  10/29/2007  Job:  [id number]

## 2010-07-03 NOTE — Discharge Summary (Signed)
NAMEZARAY, GATCHEL NO.:  [id number]   MEDICAL RECORD NO.:  [id number]          PATIENT TYPE:  INP   LOCATION:  1621                         FACILITY:  [facility]   PHYSICIAN:  Eulas Post, MD    DATE OF BIRTH:  Jul 02, 1940   DATE OF ADMISSION:  10/28/2007  DATE OF DISCHARGE:  11/02/2007                               DISCHARGE SUMMARY   ADMISSION DIAGNOSES:  1. Left hip intertrochanteric hip fracture.  2. Diabetes mellitus.  3. Hypertension.  4. Hypercholesterolemia.   DISCHARGE DIAGNOSES:  1. Left hip intertrochanteric hip fracture.  2. Diabetes mellitus.  3. Hypertension.  4. Hypercholesterolemia.  5. Hyponatremia.   PRIMARY PROCEDURE:  Left hip trochanteric femoral nailing performed on  October 29, 2007.   DISCHARGE MEDICATIONS:  1. Coumadin for a duration of four weeks from October 29, 2007.  2. Glipizide ER 5 mg p.o. daily.  3. Metformin 1000 mg p.o. q.a.m. and q.p.m. with 500 mg p.o. at noon      and 100 mg p.o. at noon.  4. Simvastatin 40 mg p.o. daily.  5. Cozaar 100 mg p.o. daily.  6. Coumadin 5 mg p.o. daily for a goal INR of 2-3.  7. Vicodin 5/500 1-2 tablets p.o. q.6h. p.r.n. pain.  8. Colace 100 mg p.o. b.i.d.  9. Senokot 2 tablets p.o. nightly p.r.n. constipation.   DISCHARGE INSTRUCTIONS:  She can be weightbearing as tolerated and  should have a dressing change every other day as needed until it is dry.  Once the wounds are dry, she does not need a dressing any longer.  She  should be okay to shower by November 08, 2007.   HOSPITAL COURSE:  Virgia Kelner is a 70 year old woman who presented with  a mechanical fall and had a left intertrochanteric hip fracture.  She  elected to undergo left hip trochanteric femoral nailing.  She tolerated  the procedure well and postoperatively did not have any complications.  Her hemoglobin and hematocrit remained stable.  She did have some mild  postoperative SIADH and hyponatremia, which  resolved.  She never had any  symptoms or illicit vitals changes.  Her wounds were clean, dry, and  intact at the time of discharge.  Her calves were  soft.  She was put on Coumadin for DVT prophylaxis as well as sequential  compression devices.  She was given Ancef for antimicrobial prophylaxis.  She was making good improvement with physical therapy __________ therapy  for ambulation and __________.  Patient benefited maximally from  hospital stay without complications.      Eulas Post, MD  Electronically Signed     JPL/MEDQ  D:  11/02/2007  T:  11/02/2007  Job:  [id number]

## 2010-07-06 NOTE — Procedures (Signed)
PURPOSE OF EVALUATION:  Independent medical evaluation requested by [facility].   CLAIM NUMBER:  [id number].   DATE OF INJURY:  November 12, 2001.   DATE OF BIRTH:  06-10-1940.   MCN NUMBER:  [id number].   SOCIAL SECURITY NUMBER:  [id number].   CARRIER:  [employer].   HISTORY OF PRESENT ILLNESS:  Rachel Benton is a 70 year old adult female  referred to this office by [facility] for an independent  medical evaluation.   The patient reports that she fell at work when she was working for a [employer] company named [employer]. She reports that fall occurred on firm  surface such as concrete, and this occurred November 12, 2001. She reports  that she initially went to [facility], a local primary care office,  and saw some unknown doctor at that time. She reports that she went home but  then had increased symptoms and was taken by her family to the local  emergency room.   On November 12, 2001, the patient was apparently seen at [facility] emergency room complaining of head and neck pain. She reports that  she had slipped on water and fell and hit her head, back, and shoulders on  the floor. Diagnosis was head contusion and neck sprain. Head CT was  negative for acute intracranial abnormality, and cervical film showed  degenerative disk disease at C5-6 and C6-7.   On January 02, 2002, the patient was again seen at [facility]  emergency room complaining of chest pain and headache pain with nausea. A  nonacute EKG was obtained. Nonspecific headache pain and nonspecific chest  pain were the diagnosis along with hyperglycemia. She was given morphine  sulfate injection at that time.   On January 03, 2002, the patient had a head CT without contrast which  reportedly showed a small old right cerebellar infarct with no acute  abnormalities. Chest x-ray also showed no acute abnormality.   On January 04, 2002, medical records indicate the patient was seen at  [facility] emergency room for generalized body pain. The patient  is not remembering that emergency room visit.   On January 05, 2002, she was diagnosed with a urinary tract infection and  prescribed ciprofloxacin.   On February 17, 2002, the patient underwent her first physical therapy  evaluation. She reports that she attended therapy for about five to six  visits with questionable benefit.   Subsequent to this, a MRI scan was done, but I do not have the specific  date. Reportedly that scan was negative.   March 02, 2002, the patient was seen by a local orthopedist, Dr. Sherlean Foot. He  also reported that the MRI scan of her unknown body part was negative. He  recommended follow up in one month's time.   April 26, 2002, the patient was seen by Dr. Murray Hodgkins. At that time, she was  diagnosed with bilateral SI joint arthropathy. She underwent SI injections  for the first time by Dr. Murray Hodgkins May 18, 2002. She reports that she gained  good relief afterwards.   On June 10, 2002, Dr. Murray Hodgkins saw the patient in follow up, and she was 50%  better overall. He questioned whether she was at maximal medical  improvement. He allowed her to work sedentary to light work, up to 15  pounds, with alternate sitting, standing, and walking for comfort. No  prescription medication for headaches was given at that  time.   On Jun 24, 2002, the patient was started on Neurontin.   On Jul 08, 2002, the patient was sent for a functional capacity evaluation.  She was also recommended to see an ear, nose, and throat specialist for  complaints of ringing in her ears.   On Jul 15, 2002, the patient underwent a functional capacity evaluation  which showed she was able to work at sedentary physical demand level. She  scored 1/5 on a positive [employer] behavior. She passed 90% of the validity  criteria.   On July 23, 2002, the patient saw Dr. Murray Hodgkins in  follow up. He reviewed the  functional capacity evaluation with her. He felt she had reached maximal  medical improvement and rated her at a 1% permanent partial impairment for  her pain. He noted that the ear, nose, and throat consult was initially  denied by worker's compensation. She continued on Neurontin and Ultram at  that point.   On March 04, 2003, the patient saw Dr. Murray Hodgkins in follow up. At that time,  she wished to have repeat SI joint injections done.   On March 15, 2003, the patient saw Dr. Ezzard Standing, an ear, nose, and throat  specialist. His diagnosis was post-traumatic headaches and tinnitus. He  suggested a neuro consult.   On March 17, 2003, the patient underwent her second round of bilateral SI  joint injections with Dr. Murray Hodgkins.   On April 04, 2003, the patient saw Dr. Asa Lente, a local  neurologist. He diagnosed chronic post-traumatic headache disorder. He noted  that she was using Tylenol. He suggested referral to Dr. Leonides Cave, a local  neuropsychologist. He also recommended the patient start Topamax 25 mg  q.h.s. and increase to 100 mg q.h.s. as needed.   On Jun 28, 2003, the patient underwent her third bilateral SI joint  injection with Dr. Murray Hodgkins. She reports that she generally gets 10 to 12  weeks of relief after those injections.   On Jul 06, 2003, the patient had her last appointment with Dr. Murray Hodgkins.  Followup with Dr. Murray Hodgkins was on an as needed basis at that point.   Presently, the patient reports few problems after the last injection of her  SI joint. She reports that before the injection she generally has pain of  10/10, but after the injection, her pain is at maximum 4/10. She reports  that prior to the injection she has significant trouble sleeping, either  lying on her right or left side, along with low back and bilateral leg pain. She denies any bowel or bladder incontinence. She reports that she does take  Topamax 25 mg q.h.s. for her  headache pain, and that does give her relief.  She reports that she has not needed to go up on that medication as she  prefers to take as little medication as possible. She still takes Ultram on  a mostly daily basis for her low back pain.   PAST MEDICAL HISTORY:  1. Noninsulin-dependent diabetes mellitus.  2. Prior tubal ligation.  3. Prior hysterectomy.  4. Prior injury at [employer] _______________ when she feel     and hit her left knee and twisted her right hip.  5. History of low back pain per chart, dating to at least May 26, 1995 with     a diagnosis of sacroiliitis after a fall on the stairs December 1996 (the     patient denies any history dating back to the 90s involving back  pain).  6. Obesity.  7. Hypercholesterolemia.  8. Hypertension.   ALLERGIES:  SULFA.   FAMILY HISTORY:  The patient's father is deceased secondary to a MI and  stroke. Her mother is also deceased secondary to a stroke and diabetes  mellitus.   SOCIAL HISTORY:  The patient denies alcohol or tobacco use. She reports that  she is married with five grown children. She last worked the day of her last  fall November 12, 2001. She reports that she would go back to work if  able.   MEDICATIONS:  1. Avandamet 2 mg/1,000 mg q.d.  2. Cozaar 50 mg q.d.  3. Zocor 40 mg q.d.  4. Tramadol 50 mg q.d. p.r.n.  5. Topamax 25 mg q.h.s. p.r.n.   PHYSICAL EXAMINATION:  GENERAL:  Well appearing, well-nourished, adult  female in no acute discomfort int he office today. Blood pressure was  measured at 118/81 with a pulse of 73, respiratory rate 14, and O2  saturation 97% on room air. The patient has normal ability to walk with only  minimal limp of her left leg. She is unable to toe walk or heel walk in the  office today. She does not use any assistive device for ambulation.   Bilateral upper extremity exam showed normal range of motion throughout.  Bulk and tone were normal. Reflexes were 2+ and  symmetrical. Strength was 5-  /5 throughout the bilateral upper extremities with otherwise normal exam.  She was not tender to palpation of the bilateral upper extremities.   Bilateral lower extremity exam showed normal bulk and tone throughout. Right  hip flexion was 4+/5 and left 4/5. Knee extension was 4+/5 bilateral as was  ankle dorsi flexion bilaterally. Sensation was intact to light touch  throughout the bilateral lower extremities. Bulk and tone were normal.  Reflexes were 2+ at the bilateral knees and ankles.   In a supine position, straight leg raise was negative to 45 degrees. Range  of motion was normal bilaterally. In the prone position, she was tender over  the bilateral SI joints to palpation. There was minimal tenderness to the  musculature of the lumbar region other than the SI joints as noted.   IMPRESSION: 1. 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   The remainder of the report will be addressed by the proposed questions from  [facility]. The first question is regard to disability status.  I rate her as a mild impairment in regard to this fall dating from September  of 2003.   In regard to when the injured worker will reach permanent and stationary  status. I feel that she probably is at maximal medical improvement at this  point. I feel that Dr. Murray Hodgkins probably has felt that for several months now.  She does receive periodic injections with him for the SI joint, and those  according to the patient give her relief. It is unknown if those will be  required for indefinite period of time, but they certainly have given her  relief at least the last three times they have been done, and that relief  lasts for several weeks.   The next question is in regard to diagnosis and asked that it be supported  by objective findings. I really have no objective findings that are provided  to me at this point other than her physical  examination. The MRI studies  that I have told were done were not provided to me, nor were the specific  results of those provided. I have been specifically been presented with a  head CT result, but those are less of a problem at this time as her  headaches are mainly controlled by the Topamax medication that she is  taking.   In regard to injury and subsequent treatment along with medical  documentation supporting a causative relationship between the accident  and/or the injury sustained. I have noted that the medical records do  indicate fall and a diagnosis of sacroiliitis dating back to the late 1990s.  The subsequent studies have not show any new findings as best I can  determine with the limited information that have been provided. It seems  reasonably that the fall dating from November 12, 2001 may have aggravated  preexisting sacroiliitis, but I really have minimal information regarding  that and minimal information regarding the specific diagnostic studies done  to date.   Remainder of the questions are actually repeating some of the prior  questions in terms of maximal medical improvement. As stated above, I feel  the patient has reached maximal medical improvement although I think ongoing  treatment with the Ultram on a periodic basis and possibly the SI joint  injections may be necessary for her in the future.   Regarding work ability, she has been show to be able to work at a sedentary  physical demand level according to the functional capacity evaluation in May  of 2004. I certainly would concur with that at this time. Apparently, prior  conversations between the patient and Dr. Murray Hodgkins regarding that functional  capacity evaluation have not been fruitful as the patient reports that she  does not feel that the employer can modify her job. In any event, I feel that she is capable of sedentary physical demand level given that functional  capacity evaluation done  approximately one year ago.   In terms of rating, I would rate her at a 3% impairment rating (three  percent) for her SI joint pain using the American Medical Association Guide  for the evaluation of permanent impairment.   Should there be any further questions not addressed in this note, please  contact me.                                                             Rachel Benton, M.D.   DC/MedQ  D:  07/07/2003 11:24:06  T:  07/07/2003 12:52:59  Job #:  [id number]   cc:   [facility]  [address] [place], [place] [address]

## 2010-08-02 ENCOUNTER — Other Ambulatory Visit: Payer: Self-pay | Admitting: Family Medicine

## 2010-08-02 NOTE — Telephone Encounter (Signed)
Pt needs to call for appt. 

## 2010-08-23 ENCOUNTER — Other Ambulatory Visit: Payer: Self-pay | Admitting: Family Medicine

## 2010-08-31 ENCOUNTER — Other Ambulatory Visit: Payer: Self-pay | Admitting: Family Medicine

## 2010-08-31 NOTE — Telephone Encounter (Signed)
[facility] [address] [place] electronically request refill for Plavix 75mg . #30. Pt needs to call for appt.

## 2010-09-12 ENCOUNTER — Other Ambulatory Visit: Payer: Self-pay | Admitting: Family Medicine

## 2010-09-12 NOTE — Telephone Encounter (Signed)
Decline the metformin and tell them to call [facility]  When we find out how to take her off my list as a pt- please do so

## 2010-09-12 NOTE — Telephone Encounter (Signed)
[facility] in [place] electronically request refill for Metformin 1000mg . I tried to call pt at her only contact # and also the ER # Sharmon Leyden and both #s were disconnected. I called Chestine Spore which is a Energy manager and Jill Side gave me an updated phone # for pt [phone]. I called pt and asked if she would like to make appt to come in and see Dr Milinda Antis since she has not been seen since 11/01/09 as was to follow up in one month. Pt said she was not seeing Dr Milinda Antis any more and had been going to see Dr Reyes Ivan at [place]. I asked pt to let [facility] pharmacy know they need to contact the dr at [facility] for refills and pt said she would let [facility] know. Is it OK to refuse this Metformin refill.Please advise.

## 2010-10-04 ENCOUNTER — Other Ambulatory Visit: Payer: Self-pay | Admitting: Family Medicine

## 2010-10-04 NOTE — Telephone Encounter (Signed)
LMOVM at home number for patient to phone in for an appt with Dr. Milinda Antis.

## 2010-10-24 ENCOUNTER — Other Ambulatory Visit: Payer: Self-pay | Admitting: Family Medicine

## 2010-10-25 NOTE — Telephone Encounter (Signed)
[facility] electronically request refill for Plavix 75 mg. I called pt at [phone] and pt said she is no longer coming to Dr Milinda Antis. Pt said she is seeing Dr Clent Ridges at [place]. Refused refill.

## 2010-11-21 LAB — BASIC METABOLIC PANEL
BUN: 4 — ABNORMAL LOW
BUN: 5 — ABNORMAL LOW
CO2: 27
CO2: 29
Calcium: 7.9 — ABNORMAL LOW
Calcium: 8.1 — ABNORMAL LOW
Chloride: 101
Chloride: 102
Creatinine, Ser: 0.48
Creatinine, Ser: 0.53
GFR calc Af Amer: >60
GFR calc Af Amer: >60
GFR calc Af Amer: >60
GFR calc non Af Amer: >60
GFR calc non Af Amer: >60
Glucose, Bld: 215 — ABNORMAL HIGH
Potassium: 4.2
Sodium: 132 — ABNORMAL LOW

## 2010-11-21 LAB — GLUCOSE, CAPILLARY
Glucose-Capillary: 147 — ABNORMAL HIGH
Glucose-Capillary: 196 — ABNORMAL HIGH
Glucose-Capillary: 202 — ABNORMAL HIGH
Glucose-Capillary: 203 — ABNORMAL HIGH
Glucose-Capillary: 211 — ABNORMAL HIGH
Glucose-Capillary: 270 — ABNORMAL HIGH
Glucose-Capillary: 298 — ABNORMAL HIGH

## 2010-11-21 LAB — COMPREHENSIVE METABOLIC PANEL
Albumin: 3.3 — ABNORMAL LOW
Alkaline Phosphatase: 73
BUN: 8
CO2: 27
Chloride: 103
Creatinine, Ser: 0.63
GFR calc non Af Amer: >60
Glucose, Bld: 243 — ABNORMAL HIGH
Potassium: 3.4 — ABNORMAL LOW
Total Bilirubin: 0.6

## 2010-11-21 LAB — PROTIME-INR
INR: 1.4
INR: 1.9 — ABNORMAL HIGH
INR: 2.3 — ABNORMAL HIGH
Prothrombin Time: 13.7
Prothrombin Time: 18.1 — ABNORMAL HIGH
Prothrombin Time: 22.7 — ABNORMAL HIGH
Prothrombin Time: 26.9 — ABNORMAL HIGH

## 2010-11-21 LAB — CBC
HCT: 33.1 — ABNORMAL LOW
HCT: 37
Hemoglobin: 12.1
MCHC: 32.5
MCHC: 33.1
MCV: 80.3
MCV: 82
MCV: 82.2
Platelets: 161
Platelets: ADEQUATE
RBC: 3.88
RBC: 4.04
RBC: 4.61
RDW: 15.4
WBC: 6.1
WBC: 7.6
WBC: 7.7

## 2010-11-21 LAB — DIFFERENTIAL
Basophils Absolute: 0
Basophils Relative: 0
Lymphocytes Relative: 18
Monocytes Absolute: 0.5
Neutro Abs: 5.7

## 2010-11-21 LAB — APTT: aPTT: 30

## 2010-11-29 ENCOUNTER — Encounter: Payer: Self-pay | Admitting: Family Medicine

## 2011-03-27 ENCOUNTER — Other Ambulatory Visit: Payer: Self-pay | Admitting: Family Medicine

## 2011-12-31 ENCOUNTER — Other Ambulatory Visit: Payer: Self-pay | Admitting: Internal Medicine

## 2011-12-31 DIAGNOSIS — Z1231 Encounter for screening mammogram for malignant neoplasm of breast: Secondary | ICD-10-CM

## 2012-02-07 ENCOUNTER — Ambulatory Visit
Admission: RE | Admit: 2012-02-07 | Discharge: 2012-02-07 | Disposition: A | Discharge status: Home / Self Care | Payer: Medicare Other | Source: Ambulatory Visit | Attending: Internal Medicine | Admitting: Internal Medicine

## 2012-02-07 DIAGNOSIS — Z1231 Encounter for screening mammogram for malignant neoplasm of breast: Secondary | ICD-10-CM

## 2012-06-22 ENCOUNTER — Other Ambulatory Visit: Payer: Self-pay | Admitting: Gastroenterology

## 2012-07-05 DIAGNOSIS — Z9181 History of falling: Secondary | ICD-10-CM

## 2012-07-05 HISTORY — DX: History of falling: Z91.81

## 2012-07-08 ENCOUNTER — Encounter (HOSPITAL_COMMUNITY): Payer: Self-pay | Admitting: Emergency Medicine

## 2012-07-08 ENCOUNTER — Emergency Department (HOSPITAL_COMMUNITY)
Admission: EM | Admit: 2012-07-08 | Discharge: 2012-07-09 | Disposition: A | Discharge status: Home / Self Care | Payer: Medicare Other | Attending: Emergency Medicine | Admitting: Emergency Medicine

## 2012-07-08 ENCOUNTER — Emergency Department (HOSPITAL_COMMUNITY): Payer: Medicare Other

## 2012-07-08 DIAGNOSIS — E119 Type 2 diabetes mellitus without complications: Secondary | ICD-10-CM | POA: Insufficient documentation

## 2012-07-08 DIAGNOSIS — I10 Essential (primary) hypertension: Secondary | ICD-10-CM | POA: Insufficient documentation

## 2012-07-08 DIAGNOSIS — K625 Hemorrhage of anus and rectum: Secondary | ICD-10-CM | POA: Insufficient documentation

## 2012-07-08 DIAGNOSIS — Y929 Unspecified place or not applicable: Secondary | ICD-10-CM | POA: Insufficient documentation

## 2012-07-08 DIAGNOSIS — Z79899 Other long term (current) drug therapy: Secondary | ICD-10-CM | POA: Insufficient documentation

## 2012-07-08 DIAGNOSIS — Y9389 Activity, other specified: Secondary | ICD-10-CM | POA: Insufficient documentation

## 2012-07-08 DIAGNOSIS — C801 Malignant (primary) neoplasm, unspecified: Secondary | ICD-10-CM | POA: Insufficient documentation

## 2012-07-08 DIAGNOSIS — Z8673 Personal history of transient ischemic attack (TIA), and cerebral infarction without residual deficits: Secondary | ICD-10-CM | POA: Insufficient documentation

## 2012-07-08 DIAGNOSIS — IMO0002 Reserved for concepts with insufficient information to code with codable children: Secondary | ICD-10-CM | POA: Insufficient documentation

## 2012-07-08 DIAGNOSIS — Z7982 Long term (current) use of aspirin: Secondary | ICD-10-CM | POA: Insufficient documentation

## 2012-07-08 DIAGNOSIS — W010XXA Fall on same level from slipping, tripping and stumbling without subsequent striking against object, initial encounter: Secondary | ICD-10-CM | POA: Insufficient documentation

## 2012-07-08 DIAGNOSIS — R198 Other specified symptoms and signs involving the digestive system and abdomen: Secondary | ICD-10-CM | POA: Insufficient documentation

## 2012-07-08 DIAGNOSIS — C20 Malignant neoplasm of rectum: Secondary | ICD-10-CM

## 2012-07-08 DIAGNOSIS — Z7902 Long term (current) use of antithrombotics/antiplatelets: Secondary | ICD-10-CM | POA: Insufficient documentation

## 2012-07-08 DIAGNOSIS — C785 Secondary malignant neoplasm of large intestine and rectum: Secondary | ICD-10-CM | POA: Insufficient documentation

## 2012-07-08 DIAGNOSIS — K6289 Other specified diseases of anus and rectum: Secondary | ICD-10-CM

## 2012-07-08 HISTORY — DX: Cerebral infarction, unspecified: I63.9

## 2012-07-08 HISTORY — DX: Essential (primary) hypertension: I10

## 2012-07-08 HISTORY — DX: Type 2 diabetes mellitus without complications: E11.9

## 2012-07-08 LAB — CBC WITH DIFFERENTIAL/PLATELET
Basophils Absolute: 0 10*3/uL (ref 0.0–0.1)
Eosinophils Relative: 0 % (ref 0–5)
HCT: 36.2 % (ref 36.0–46.0)
Hemoglobin: 11.8 g/dL — ABNORMAL LOW (ref 12.0–15.0)
Lymphocytes Relative: 15 % (ref 12–46)
Lymphs Abs: 1.5 10*3/uL (ref 0.7–4.0)
MCV: 65.6 fL — ABNORMAL LOW (ref 78.0–100.0)
Monocytes Absolute: 1 10*3/uL (ref 0.1–1.0)
Neutro Abs: 7.7 10*3/uL (ref 1.7–7.7)
RBC: 5.52 MIL/uL — ABNORMAL HIGH (ref 3.87–5.11)
RDW: 20.2 % — ABNORMAL HIGH (ref 11.5–15.5)
WBC: 10.2 10*3/uL (ref 4.0–10.5)

## 2012-07-08 LAB — GLUCOSE, CAPILLARY

## 2012-07-08 LAB — COMPREHENSIVE METABOLIC PANEL
Albumin: 3.2 g/dL — ABNORMAL LOW (ref 3.5–5.2)
Alkaline Phosphatase: 108 U/L (ref 39–117)
BUN: 14 mg/dL (ref 6–23)
Calcium: 9.1 mg/dL (ref 8.4–10.5)
Potassium: 3.8 mEq/L (ref 3.5–5.1)
Sodium: 132 mEq/L — ABNORMAL LOW (ref 135–145)
Total Protein: 6.7 g/dL (ref 6.0–8.3)

## 2012-07-08 LAB — URINALYSIS, ROUTINE W REFLEX MICROSCOPIC
Glucose, UA: 250 mg/dL — AB
Protein, ur: NEGATIVE mg/dL
Specific Gravity, Urine: 1.023 (ref 1.005–1.030)
pH: 5.5 (ref 5.0–8.0)

## 2012-07-08 LAB — URINE MICROSCOPIC-ADD ON

## 2012-07-08 LAB — PROTIME-INR: Prothrombin Time: 13.5 seconds (ref 11.6–15.2)

## 2012-07-08 MED ORDER — SODIUM CHLORIDE 0.9 % IV BOLUS (SEPSIS)
1000.0000 mL | Freq: Once | INTRAVENOUS | Status: AC
  Administered 2012-07-08: 1000 mL via INTRAVENOUS

## 2012-07-08 MED ORDER — IOHEXOL 300 MG/ML  SOLN
100.0000 mL | Freq: Once | INTRAMUSCULAR | Status: AC | PRN
  Administered 2012-07-08: 100 mL via INTRAVENOUS

## 2012-07-08 MED ORDER — HYDROCODONE-ACETAMINOPHEN 5-325 MG PO TABS: 1.0000 | ORAL_TABLET | ORAL | Status: DC | PRN

## 2012-07-08 MED ORDER — IOHEXOL 300 MG/ML  SOLN
25.0000 mL | INTRAMUSCULAR | Status: AC
  Administered 2012-07-08 (×2): 25 mL via ORAL

## 2012-07-08 MED ORDER — HYDROCODONE-ACETAMINOPHEN 5-325 MG PO TABS: 1.0000 | ORAL_TABLET | Freq: Once | ORAL | Status: DC

## 2012-07-08 NOTE — ED Notes (Signed)
Rectum checked by MD with the Anoscope

## 2012-07-08 NOTE — ED Notes (Signed)
Pt sts fell on Sunday and c/o mid back pain; pt denies LOC but sts hit head on left side and takes plavix; pt sts hemorrhoids painful x several weeks

## 2012-07-08 NOTE — ED Notes (Signed)
Pt is alert and no lab work ordered.  Pt is diabetic and feels like she needs her sugar checked.

## 2012-07-08 NOTE — ED Provider Notes (Signed)
History     CSN: [id number]  Arrival date & time 07/08/12  1307   First MD Initiated Contact with Patient 07/08/12 1845      Chief Complaint  Patient presents with  . Fall  . Hemorrhoids    (Consider location/radiation/quality/duration/timing/severity/associated sxs/prior treatment) HPI Comments: 72 y.o. female w/ htn, diabetes, who presents to the ER w/ the cc of lower back pain, and painful hemorrhoids. She states that she feel Sunday, and since then has had back pain. Mechanical fall, she did not have LOC, and does not have current HA (she states she 'bumped her left knee and then slipped and fell). She states when she feel she feels she "twisted something in her back". She states she has been taking "new pain pills" for the past 2 weeks, and these have caused her to be constipated. She states she went to PCP earlier today, who was concerned and told her to come to the ER. Is not on stool softeners with her pain medicine.   Patient is a 72 y.o. female presenting with general illness. The history is provided by the patient.  Illness Severity:  Moderate Duration:  2 weeks Timing:  Constant Progression:  Worsening Chronicity:  New Associated symptoms: no abdominal pain, no chest pain, no cough, no diarrhea, no fatigue, no fever, no headaches, no rash, no vomiting and no wheezing     Past Medical History  Diagnosis Date  . Stroke   . Hypertension   . Diabetes mellitus without complication     History reviewed. No pertinent past surgical history.  History reviewed. No pertinent family history.  History  Substance Use Topics  . Smoking status: Never Smoker   . Smokeless tobacco: Not on file  . Alcohol Use: No    OB History   Grav Para Term Preterm Abortions TAB SAB Ect Mult Living                  Review of Systems  Constitutional: Negative for fever, chills and fatigue.  HENT: Negative for facial swelling, drooling, neck pain and dental problem.   Eyes: Negative  for pain, discharge and itching.  Respiratory: Negative for cough, choking, wheezing and stridor.   Cardiovascular: Negative for chest pain.  Gastrointestinal: Negative for vomiting, abdominal pain and diarrhea.  Endocrine: Negative for cold intolerance and heat intolerance.  Genitourinary: Negative for vaginal discharge, difficulty urinating and vaginal pain.  Musculoskeletal: Positive for back pain.  Skin: Negative for pallor and rash.  Neurological: Negative for dizziness, light-headedness and headaches.  Psychiatric/Behavioral: Negative for behavioral problems and agitation.    Allergies  Ace inhibitors; Aspirin; Fish oil; Glipizide; Ibuprofen; Rosiglitazone maleate; and Sulfonamide derivatives  Home Medications   Current Outpatient Rx  Name  Route  Sig  Dispense  Refill  . aspirin EC 81 MG tablet   Oral   Take 81 mg by mouth daily.         . clopidogrel (PLAVIX) 75 MG tablet   Oral   Take 75 mg by mouth daily.         . cyclobenzaprine (FLEXERIL) 5 MG tablet   Oral   Take 5 mg by mouth 3 (three) times daily as needed for muscle spasms.         Marland Kitchen losartan (COZAAR) 100 MG tablet   Oral   Take 100 mg by mouth daily.         . metFORMIN (GLUCOPHAGE) 1000 MG tablet      TAKE ONE  TABLET IN THE MORNING, ONE-HALF TABLET AT LUNCH AND ONE TABLET IN THE EVENING (NEED APPOINTMENT)   30 tablet   0     Patient needs appt for further refills. Please ask ...   . pravastatin (PRAVACHOL) 40 MG tablet   Oral   Take 40 mg by mouth daily.           BP 148/81  Pulse 82  Temp(Src) 97.9 F (36.6 C) (Oral)  Resp 18  SpO2 98%  Physical Exam  Constitutional: She is oriented to person, place, and time. She appears well-developed. No distress.  HENT:  Head: Normocephalic and atraumatic.  Eyes: Pupils are equal, round, and reactive to light. Right eye exhibits no discharge. Left eye exhibits no discharge.  Neck: Neck supple. No tracheal deviation present.   Cardiovascular: Normal rate.  Exam reveals no gallop and no friction rub.   Pulmonary/Chest: No stridor. No respiratory distress. She has no wheezes.  Abdominal: Soft. She exhibits no distension. There is no tenderness. There is no rebound.  Genitourinary:  Pt with bright red blood and mass palpated on left side. Anoscopy is done and a irregularly and necrotic appearing mass is appreciated on left side of rectum.   Musculoskeletal: She exhibits no edema and no tenderness.  Pt with bilateral lumbar paraspinal ttp. She does not have C/T/L spine ttp.   Neurological: She is alert and oriented to person, place, and time. No cranial nerve deficit. Coordination normal.  Can ambulate in the room briskly. Can do heel to toe without issues.   Skin: Skin is warm. She is not diaphoretic.    ED Course  Procedures (including critical care time)  Labs Reviewed  GLUCOSE, CAPILLARY - Abnormal; Notable for the following:    Glucose-Capillary 292 (*)    All other components within normal limits  OCCULT BLOOD X 1 CARD TO LAB, STOOL - Abnormal; Notable for the following:    Fecal Occult Bld POSITIVE (*)    All other components within normal limits  URINALYSIS, ROUTINE W REFLEX MICROSCOPIC - Abnormal; Notable for the following:    APPearance CLOUDY (*)    Glucose, UA 250 (*)    Hgb urine dipstick MODERATE (*)    Bilirubin Urine SMALL (*)    Leukocytes, UA SMALL (*)    All other components within normal limits  CBC WITH DIFFERENTIAL - Abnormal; Notable for the following:    RBC 5.52 (*)    Hemoglobin 11.8 (*)    MCV 65.6 (*)    MCH 21.4 (*)    RDW 20.2 (*)    All other components within normal limits  COMPREHENSIVE METABOLIC PANEL - Abnormal; Notable for the following:    Sodium 132 (*)    Chloride 93 (*)    Glucose, Bld 262 (*)    Creatinine, Ser 0.48 (*)    Albumin 3.2 (*)    All other components within normal limits  URINE MICROSCOPIC-ADD ON - Abnormal; Notable for the following:     Bacteria, UA MANY (*)    Casts HYALINE CASTS (*)    All other components within normal limits  URINE CULTURE  PROTIME-INR   Ct Chest W Contrast  07/08/2012   *RADIOLOGY REPORT*  Clinical Data:  Newly diagnosed rectal carcinoma.  Staging.  CT CHEST, ABDOMEN AND PELVIS WITH CONTRAST  Technique:  Multidetector CT imaging of the chest, abdomen and pelvis was performed following the standard protocol during bolus administration of intravenous contrast.  Contrast: OMNIPAQUE IOHEXOL 300  MG/ML  SOLN  Comparison:   None.  CT CHEST  Findings:  No suspicious pulmonary nodules or masses are identified.  No evidence of pulmonary infiltrate or central endobronchial lesion.  No evidence of pleural or pericardial effusion.  No hilar or mediastinal masses are identified.  No adenopathy seen elsewhere within the thorax.  No evidence of chest wall mass or suspicious bone lesions.  IMPRESSION: Negative.  No evidence of metastatic disease or other significant abnormality.  CT ABDOMEN AND PELVIS  Findings:  Irregular concentric mass is seen involving the rectum. Soft tissue stranding as well as lymphadenopathy is seen in the perirectal space and extending superiorly along the sigmoid mesocolon.  Largest perirectal lymph node measures 14 mm on image 90.  This is consistent with metastatic disease.  Prior hysterectomy noted.  Adnexal regions are unremarkable in appearance.  No evidence of retroperitoneal lymphadenopathy.  No liver masses are identified.  Gallbladder is unremarkable.  The pancreas, spleen, adrenal glands, and kidneys are normal appearance.  No evidence of hydronephrosis.  No evidence of inflammatory process or abnormal fluid collections.  Surgical hardware seen in the left hip.  No suspicious bone lesions identified.  IMPRESSION:  1.  Concentric irregular soft tissue mass involving the rectum, consistent with rectal carcinoma. 2.  Perirectal and sigmoid mesocolon lymphadenopathy, consistent with metastatic  disease. 3.  No evidence of distant metastatic disease.   Original Report Authenticated By: Myles Rosenthal, M.D.   Ct Abdomen Pelvis W Contrast  07/08/2012   *RADIOLOGY REPORT*  Clinical Data:  Newly diagnosed rectal carcinoma.  Staging.  CT CHEST, ABDOMEN AND PELVIS WITH CONTRAST  Technique:  Multidetector CT imaging of the chest, abdomen and pelvis was performed following the standard protocol during bolus administration of intravenous contrast.  Contrast: OMNIPAQUE IOHEXOL 300 MG/ML  SOLN  Comparison:   None.  CT CHEST  Findings:  No suspicious pulmonary nodules or masses are identified.  No evidence of pulmonary infiltrate or central endobronchial lesion.  No evidence of pleural or pericardial effusion.  No hilar or mediastinal masses are identified.  No adenopathy seen elsewhere within the thorax.  No evidence of chest wall mass or suspicious bone lesions.  IMPRESSION: Negative.  No evidence of metastatic disease or other significant abnormality.  CT ABDOMEN AND PELVIS  Findings:  Irregular concentric mass is seen involving the rectum. Soft tissue stranding as well as lymphadenopathy is seen in the perirectal space and extending superiorly along the sigmoid mesocolon.  Largest perirectal lymph node measures 14 mm on image 90.  This is consistent with metastatic disease.  Prior hysterectomy noted.  Adnexal regions are unremarkable in appearance.  No evidence of retroperitoneal lymphadenopathy.  No liver masses are identified.  Gallbladder is unremarkable.  The pancreas, spleen, adrenal glands, and kidneys are normal appearance.  No evidence of hydronephrosis.  No evidence of inflammatory process or abnormal fluid collections.  Surgical hardware seen in the left hip.  No suspicious bone lesions identified.  IMPRESSION:  1.  Concentric irregular soft tissue mass involving the rectum, consistent with rectal carcinoma. 2.  Perirectal and sigmoid mesocolon lymphadenopathy, consistent with metastatic disease. 3.   No evidence of distant metastatic disease.   Original Report Authenticated By: Myles Rosenthal, M.D.      MDM  Concern for cancer based on physical exam -- anoscopy showed necrotic mass in rectum. Will get labs, will get CT chest/abd/pelvis for eval of mets and staging.   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   Pt is tolerating PO. She is not in extremis. Pt has great f/u care -- she can see her PCP tomorrow for re-evaluation, and have told her that her PCP can further manage the various specialists that she will need to be evaluated by. Family is appreciative of the care received and understands the followup plan and states they can make this happen.   Pt also found to have signs of UTI -- she is not having dysuria and no issues urinating per her. Pt states she does not want treatment for UTI as she has no symptoms currently. States abx don't work well for her stomach. States if she gets dysuria, or frequent urination, or any urinary symptoms she will call PCP. In setting of asymptomatic signs of possible UTI on U/A and w/ pt with good f/u agree will comply with patient's request.    1. Rectal mass   2. Rectal bleeding   3. Rectal cancer            Bernadene Person, MD 07/09/12 6962

## 2012-07-08 NOTE — ED Provider Notes (Signed)
72 year-old female was sent to the ED because mass was felt by another provider when doing rectal exam and bright red blood was noted. She had been seen for back pain following a fall. On exam, she has a hard and irregular mass involving the left side of the rectum and posterior aspect of the rectum. There is bright red blood present. Anoscopy was done by Dr. Eula Listen under my direct supervision which shows a rectal mass. She will get CT scan and screening labs as she most likely has a rectal carcinoma.  CT confirms rectal carcinoma with metastases. She is discharged with appropriate referrals for further outpatient workup and consideration for surgery and oncology referrals.  I saw and evaluated the patient, reviewed the resident's note and I agree with the findings and plan.   Dione Booze, MD 07/09/12 [phone]

## 2012-07-09 MED ORDER — HYDROCODONE-ACETAMINOPHEN 5-325 MG PO TABS
2.0000 | ORAL_TABLET | Freq: Once | ORAL | Status: AC
  Administered 2012-07-09: 2 via ORAL
  Filled 2012-07-09: qty 2

## 2012-07-10 LAB — URINE CULTURE: Culture: NO GROWTH

## 2012-07-21 ENCOUNTER — Telehealth: Payer: Self-pay | Admitting: Oncology

## 2012-07-21 DIAGNOSIS — C801 Malignant (primary) neoplasm, unspecified: Secondary | ICD-10-CM

## 2012-07-21 HISTORY — DX: Malignant (primary) neoplasm, unspecified: C80.1

## 2012-07-21 NOTE — Telephone Encounter (Signed)
Pt scheduled to see Dr. Truett Perna 06/10 @ 1:30.  Welcome packet mailed.

## 2012-07-21 NOTE — ED Provider Notes (Signed)
Received call from GI physician, who states that family is upset because they were told in the ER that pt had  "death sentence". GI physician states they have resected pt's mass, and overall prognosis looks good, and now that overall prognosis looks good, family is very upset because they were told pt would "die within a few days".   I recall patient very clearly, and she was never told she would "die within a few days".  I spent approximately half an hour with family when they were in the ER discussing the overall diagnosis with them. They were teary eyed on exam, and they asked multiple times, "How long does she have", and I did not tell them a number, or even a range. I told them specialists would need to see patient to determine this further. Pt's daughter even remarked to me, "Well I guess you can't tell us anything" at one point -- I told her I could not tell them details because there were several other tests and specialists that pt needed to see. I told family she should make sure she has family members for support as specialists would need to be seen, and pt would need help coordinating appointments.   Unfortunately, It appears family took the diagnosis that we found to mean pt was going to "die soon". This was never said to patient as it was told to them that further tests and studies would need to be done to further determine prognosis.   I spent extra time after my shift to discuss the diagnosis with family and the f/u plans -- it is unfortunate that family felt they were given a "death sentence", as the GI physician described to me, as telling family members something of this nature would obviously not be appropriate. If anything, family conveyed to me that they hoped I could give them better idea of prognosis, which I repeatedly told them I was unable to do.   Bernadene Person, MD 07/21/12 1303

## 2012-07-22 ENCOUNTER — Ambulatory Visit (INDEPENDENT_AMBULATORY_CARE_PROVIDER_SITE_OTHER): Payer: Medicare Other | Admitting: General Surgery

## 2012-07-22 ENCOUNTER — Encounter (INDEPENDENT_AMBULATORY_CARE_PROVIDER_SITE_OTHER): Payer: Self-pay | Admitting: General Surgery

## 2012-07-22 VITALS — BP 138/88 | HR 62 | Temp 97.6°F | Resp 16 | Ht <= 58 in | Wt 146.6 lb

## 2012-07-22 DIAGNOSIS — C20 Malignant neoplasm of rectum: Secondary | ICD-10-CM

## 2012-07-22 DIAGNOSIS — C775 Secondary and unspecified malignant neoplasm of intrapelvic lymph nodes: Secondary | ICD-10-CM

## 2012-07-22 NOTE — Progress Notes (Signed)
Patient ID: Rachel Benton, female   DOB: 1940-07-08, 72 y.o.   MRN: [id number]  Chief Complaint  Patient presents with  . New Evaluation    eval lg rectal tumor    HPI Rachel Benton is a 72 y.o. female.   HPI  She is referred by Dr. 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 About 2-1/2-3 months ago she noted decreased caliber of her stools. She then began having some episodes of constipation and diarrhea. She started having rectal pain and was found to have a rectal mass on examination. She went to Dr. Loreta Ave and a colonoscopy was performed which demonstrated a low rectal mass extending 10 cm proximally. Biopsies were performed. It was grossly malignant.  A CT scan of the chest, abdomen, and pelvis was ordered. This demonstrated no evidence of metastatic disease or other significant abnormality in the chest.  There is noted to be an irregular soft tissue mass involving the rectum as well as perirectal and sigmoid mesocolon lymphadenopathy consistent with metastatic disease. No lesions in the liver. She has been sitting on a cushion because of the pain.   Past Medical History  Diagnosis Date  . Stroke   . Hypertension   . Diabetes mellitus without complication   . Cancer   . Hyperlipidemia     Past Surgical History  Procedure Laterality Date  . Hip arthroplasty    . Abdominal hysterectomy      Family History  Problem Relation Age of Onset  . Cancer Sister     lung  . Cancer Daughter     cervical    Social History History  Substance Use Topics  . Smoking status: Never Smoker   . Smokeless tobacco: Never Used  . Alcohol Use: No    Allergies  Allergen Reactions  . Ace Inhibitors     REACTION: ? reaction  . Aspirin     REACTION: ? reaction  . Fish Oil     REACTION: GI  . Glipizide     REACTION: hypoglycemia  . Ibuprofen     REACTION: ? reaction  . Rosiglitazone Maleate     REACTION:  edema  . Sulfonamide Derivatives     REACTION: ? reaction    Current Outpatient Prescriptions  Medication Sig Dispense Refill  . ALPRAZolam (XANAX) 0.25 MG tablet Take 0.25 mg by mouth at bedtime as needed for sleep.      Marland Kitchen losartan (COZAAR) 100 MG tablet Take 100 mg by mouth daily.      . metFORMIN (GLUCOPHAGE) 1000 MG tablet TAKE ONE TABLET IN THE MORNING, ONE-HALF TABLET AT LUNCH AND ONE TABLET IN THE EVENING (NEED APPOINTMENT)  30 tablet  0  . pravastatin (PRAVACHOL) 40 MG tablet Take 40 mg by mouth daily.      Marland Kitchen HYDROcodone-acetaminophen (NORCO/VICODIN) 5-325 MG per tablet Take 1 tablet by mouth every 4 (four) hours as needed for pain.  25 tablet  0   No current facility-administered medications for this visit.    Review of Systems Review of Systems  Constitutional: Positive for unexpected weight change (She has lost 15 pounds and has a decreased appetite.).  Respiratory: Negative for cough and shortness of breath.   Cardiovascular: Negative for chest pain.  Gastrointestinal: Positive for diarrhea and rectal pain. Negative for abdominal pain, blood in stool and anal bleeding.  Genitourinary: Positive for difficulty urinating. Negative for dysuria and  hematuria.  Skin: Positive for rash.  Neurological: Positive for weakness.  Hematological: Negative for adenopathy.  Psychiatric/Behavioral: Negative.     Blood pressure 138/88, pulse 62, temperature 97.6 F (36.4 C), temperature source Temporal, resp. rate 16, height 4\' 9"  (1.448 m), weight 146 lb 9.6 oz (66.497 kg).  Physical Exam Physical Exam  Constitutional: No distress.  Elderly, weak-appearing overweight female.  HENT:  Head: Normocephalic and atraumatic.  Eyes: No scleral icterus.  Neck: Neck supple.  Cardiovascular: Normal rate and regular rhythm.   Pulmonary/Chest: Effort normal and breath sounds normal.  Abdominal: Soft. She exhibits no distension and no mass. There is no tenderness.  Genitourinary:  Perianal  rash is present with an external hemorrhoid present. A digital rectal exam I can palpate a firm fixed lesion 3.5 cm from the anal verge.  Musculoskeletal:  No inguinal adenopathy.  Lymphadenopathy:    She has no cervical adenopathy.  Neurological: She is alert.  Skin: Skin is warm and dry.  Psychiatric: She has a normal mood and affect. Her behavior is normal.    Data Reviewed CT scan, colonoscopy, nose from Dr. Loreta Ave, laboratory values from May 2014.  Assessment    Large rectal mass beginning 3.5 cm from anal verge and extending 10 cm proximally with surrounding adenopathy consistent with rectal cancer. Biopsies are pending. She's having a fair amount of pain from this. She was discussed briefly at the multidisciplinary gastrointestinal cancer conference.    Plan    She has been referred to medical and radiation oncology for neoadjuvant chemotherapy and radiation therapy in hopes of shrinking of the tumor and being able to perform sphincter sparing surgery. We'll refill her hydrocodone. Stool softener twice a day. Laxative as needed.        Virgin Zellers J 07/22/2012, 12:42 PM

## 2012-07-22 NOTE — Patient Instructions (Signed)
Take pain medicine as needed. Take over-the-counter stool softener twice a day. Use MiraLAX as needed.  The plan would be to try to shrink the tumor with chemotherapy and radiation therapy prior to doing any surgery.

## 2012-07-23 ENCOUNTER — Encounter: Payer: Self-pay | Admitting: Radiation Oncology

## 2012-07-24 ENCOUNTER — Telehealth: Payer: Self-pay | Admitting: Oncology

## 2012-07-24 ENCOUNTER — Encounter: Payer: Self-pay | Admitting: *Deleted

## 2012-07-24 ENCOUNTER — Encounter: Payer: Self-pay | Admitting: Radiation Oncology

## 2012-07-24 DIAGNOSIS — C801 Malignant (primary) neoplasm, unspecified: Secondary | ICD-10-CM | POA: Insufficient documentation

## 2012-07-24 NOTE — Progress Notes (Signed)
GI Location of Tumor / Histology: rectum, 3.5 cm from anal verge  Patient presented 2-3 weeks ago with symptoms of: rectal bleeding, lower back pain, small/slender bm's  Biopsies of rectum (if applicable) revealed: 07/21/12  invasive adenocarcinoma, poorly differentiated w/signet ring cells, in a background of fragments of tubulovillous adenoma  Past/Anticipated interventions by surgeon, if any: sphincter sparing surgery s/p neoadjuvant chemo/radiation  Past/Anticipated interventions by medical oncology, if any: neoadjuvant chemo planned, appt w/Dr Truett Perna 07/28/12  Weight changes, if any: 20 lbs w/loss of appetite in past 2 mos  Bowel/Bladder complaints, if any: difficulty urinating or initiating urination if she postpones, hx of diarrhea alternating w/constipation, rectal bleeding, external hemorrhoid, fecal incontinence and urgency.   Nausea / Vomiting, if any: none  Pain issues, if any:  Sitting on cushion due to rectal soreness and rash, Hydrocodone 1 tab every 4 hrs, 2 tabs nightly. Pt is applying OTC cream w/40% zinc oxide for rectal discomfort.    SAFETY ISSUES:  Prior radiation? no  Pacemaker/ICD? no  Possible current pregnancy? na  Is the patient on methotrexate? no  Current Complaints / other details:  Married, retired from [employer], worked in Animal nutritionist in various jobs, 5 children, 10 grandchildren, 6 great grandchildren.  Daughter w/pt today and is living w/pt. Currently her BM's are soft, not liquid.

## 2012-07-24 NOTE — Telephone Encounter (Signed)
C/D 07/24/12 for appt. 07/28/12

## 2012-07-27 ENCOUNTER — Encounter: Payer: Self-pay | Admitting: Radiation Oncology

## 2012-07-27 ENCOUNTER — Ambulatory Visit: Payer: Medicare Other

## 2012-07-27 ENCOUNTER — Ambulatory Visit: Payer: Medicare Other | Admitting: Radiation Oncology

## 2012-07-27 ENCOUNTER — Ambulatory Visit
Admission: RE | Admit: 2012-07-27 | Discharge: 2012-07-27 | Disposition: A | Discharge status: Home / Self Care | Payer: Medicare Other | Source: Ambulatory Visit | Attending: Radiation Oncology | Admitting: Radiation Oncology

## 2012-07-27 ENCOUNTER — Telehealth: Payer: Self-pay | Admitting: *Deleted

## 2012-07-27 VITALS — BP 139/84 | HR 116 | Temp 98.7°F | Resp 20 | Wt 146.5 lb

## 2012-07-27 DIAGNOSIS — Z79899 Other long term (current) drug therapy: Secondary | ICD-10-CM | POA: Insufficient documentation

## 2012-07-27 DIAGNOSIS — E119 Type 2 diabetes mellitus without complications: Secondary | ICD-10-CM | POA: Insufficient documentation

## 2012-07-27 DIAGNOSIS — C775 Secondary and unspecified malignant neoplasm of intrapelvic lymph nodes: Secondary | ICD-10-CM

## 2012-07-27 DIAGNOSIS — E785 Hyperlipidemia, unspecified: Secondary | ICD-10-CM | POA: Insufficient documentation

## 2012-07-27 DIAGNOSIS — C20 Malignant neoplasm of rectum: Secondary | ICD-10-CM | POA: Insufficient documentation

## 2012-07-27 DIAGNOSIS — I10 Essential (primary) hypertension: Secondary | ICD-10-CM | POA: Insufficient documentation

## 2012-07-27 DIAGNOSIS — Z8673 Personal history of transient ischemic attack (TIA), and cerebral infarction without residual deficits: Secondary | ICD-10-CM | POA: Insufficient documentation

## 2012-07-27 NOTE — Progress Notes (Signed)
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  HISTORY OF PRESENT ILLNESS::Rachel Benton is a 72 y.o. female who is seen out of the courtesy of Dr. Mancel Bale for an opinion concerning radiation therapy as part of management of patient's recently diagnosed rectal cancer. Approximately 2-1/2-3 months ago she noted decreased caliber of her stools. She then began having some episodes of constipation and diarrhea. She started having rectal pain and was found to have a rectal mass on examination. She went to Dr. Loreta Ave and a colonoscopy was performed which demonstrated a low rectal mass extending 10 cm proximally. Biopsies were performed. It was grossly malignant. A CT scan of the chest, abdomen, and pelvis was ordered. This demonstrated no evidence of metastatic disease or other significant abnormality in the chest. There was noted to be an irregular soft tissue mass involving the rectum as well as perirectal and sigmoid mesocolon consistent with metastatic disease. No lesions in the liver. She has been sitting on a cushion because of the pain. The largest perirectal lymph node measuring 1.4 cm.  The patient was seen by Dr. Abbey Chatters who has recommended consideration for preoperative treatment with attempts at sphincter preservation.. The patient is now seen in radiation oncology concerning this issue.   PREVIOUS RADIATION THERAPY: No  PAST MEDICAL HISTORY:  has a past medical history of Hypertension; Diabetes mellitus without complication; Hyperlipidemia; Back pain; Insomnia; Hyperlipidemia; Stroke (2009); Hemorrhoids; History of recent fall (07/05/12); and Cancer (07/21/12).    PAST  SURGICAL HISTORY: Past Surgical History  Procedure Laterality Date  . Hip arthroplasty Left 2009    fall injury  . Abdominal hysterectomy      TAH, > 37 yrs ago  . Bladder suspension      many yrs ago    FAMILY HISTORY: family history includes Cancer in her daughter and sister and Diabetes in her mother.  SOCIAL HISTORY:  reports that she has never smoked. She has never used smokeless tobacco. She reports that she does not drink alcohol or use illicit drugs.  ALLERGIES: Ace inhibitors; Aspirin; Fish oil; Glipizide; Ibuprofen; Rosiglitazone maleate; and Sulfonamide derivatives  MEDICATIONS:  Current Outpatient Prescriptions  Medication Sig Dispense Refill  . Aloe Vera LIQD Apply topically.      . ALPRAZolam (XANAX) 0.25 MG tablet Take 0.25 mg by mouth at bedtime as needed for sleep.      . feeding supplement (PRO-STAT SUGAR FREE 64) LIQD Take 30 mLs by mouth 3 (three) times daily with meals.      Marland Kitchen HYDROcodone-acetaminophen (NORCO/VICODIN) 5-325 MG per tablet Take 1 tablet by mouth every 4 (four) hours as needed for pain.  25 tablet  0  . losartan (COZAAR) 100 MG tablet Take 100 mg by mouth daily.      . metFORMIN (GLUCOPHAGE) 1000 MG tablet TAKE ONE TABLET IN THE MORNING, ONE-HALF TABLET AT LUNCH AND ONE TABLET IN THE EVENING (NEED APPOINTMENT)  30 tablet  0  . pravastatin (PRAVACHOL) 40 MG tablet Take 40 mg by mouth daily.       No current facility-administered medications for this encounter.    REVIEW OF SYSTEMS:  A 15 point review of systems is documented in the electronic  medical record. This was obtained by the nursing staff. However, I reviewed this with the patient to discuss relevant findings and make appropriate changes.  Symptoms as above the history of present illness. She does have limited ambulation in light of a prior hip fracture.  she denies any vaginal bleeding. She does have some hesitation with urination. She denies any cough or breathing problems.   PHYSICAL EXAM:   weight is 146 lb 8 oz (66.452 kg). Her oral temperature is 98.7 F (37.1 C). Her blood pressure is 139/84 and her pulse is 116. Her respiration is 20.   BP 139/84  Pulse 116  Temp(Src) 98.7 F (37.1 C) (Oral)  Resp 20  Wt 146 lb 8 oz (66.452 kg)  BMI 31.69 kg/m2  General Appearance:    Alert, cooperative, no distress, appears stated age, accompanied by her daughter on evaluation today   Head:    Normocephalic, without obvious abnormality, atraumatic  Eyes:    PERRL, conjunctiva/corneas clear, EOM's intact       Nose:   Nares normal, septum midline, mucosa normal, no drainage    or sinus tenderness  Throat:   Lips, mucosa, and tongue normal; dentures in place, gums normal  Neck:   Supple, symmetrical, trachea midline, no adenopathy;    thyroid:  no enlargement/tenderness/nodules    Back:     Symmetric, no curvature, ROM normal, no CVA tenderness  Lungs:     Clear to auscultation bilaterally, respirations unlabored  Chest Wall:    No tenderness or deformity   Heart:    Regular rate and rhythm     Abdomen:     Soft, non-tender, bowel sounds active all four quadrants,    no masses, no organomegaly     Rectal:       sphincter tone noted to be normal, approximately 3-4 cm from the anus is a hard partially circumferential mass which extends from anterior location to the right side of the  patient. This extends up as far as digital exam will allow.   Extremities:   Extremities normal, atraumatic, some edema in the left ankle and foot area from prior fracture   Pulses:   2+ and symmetric all extremities  Skin:   Skin color, texture, turgor normal, no rashes or lesions  Lymph nodes:   Cervical, supraclavicular, and axillary nodes normal  Neurologic:   normal strength, sensation and reflexes    throughout    LABORATORY DATA:  Lab Results  Component Value Date   WBC 10.2 07/08/2012   HGB 11.8* 07/08/2012   HCT 36.2 07/08/2012   MCV 65.6* 07/08/2012   PLT 287 07/08/2012   Lab Results    Component Value Date   NA 132* 07/08/2012   K 3.8 07/08/2012   CL 93* 07/08/2012   CO2 27 07/08/2012   Lab Results  Component Value Date   ALT 9 07/08/2012   AST 11 07/08/2012   ALKPHOS 108 07/08/2012   BILITOT 0.3 07/08/2012     RADIOGRAPHY:  Ct Abdomen Pelvis W Contrast  07/08/2012   *RADIOLOGY REPORT*  Clinical Data:  Newly diagnosed rectal carcinoma.  Staging.  CT CHEST, ABDOMEN AND PELVIS WITH CONTRAST  Technique:  Multidetector CT imaging of the chest, abdomen and pelvis was performed following the standard protocol during bolus administration of intravenous contrast.  Contrast: OMNIPAQUE IOHEXOL 300 MG/ML  SOLN  Comparison:   None.  CT CHEST  Findings:  No suspicious pulmonary nodules or masses are identified.  No  evidence of pulmonary infiltrate or central endobronchial lesion.  No evidence of pleural or pericardial effusion.  No hilar or mediastinal masses are identified.  No adenopathy seen elsewhere within the thorax.  No evidence of chest wall mass or suspicious bone lesions.  IMPRESSION: Negative.  No evidence of metastatic disease or other significant abnormality.  CT ABDOMEN AND PELVIS  Findings:  Irregular concentric mass is seen involving the rectum. Soft tissue stranding as well as lymphadenopathy is seen in the perirectal space and extending superiorly along the sigmoid mesocolon.  Largest perirectal lymph node measures 14 mm on image 90.  This is consistent with metastatic disease.  Prior hysterectomy noted.  Adnexal regions are unremarkable in appearance.  No evidence of retroperitoneal lymphadenopathy.  No liver masses are identified.  Gallbladder is unremarkable.  The pancreas, spleen, adrenal glands, and kidneys are normal appearance.  No evidence of hydronephrosis.  No evidence of inflammatory process or abnormal fluid collections.  Surgical hardware seen in the left hip.  No suspicious bone lesions identified.  IMPRESSION:  1.  Concentric irregular soft tissue mass involving  the rectum, consistent with rectal carcinoma. 2.  Perirectal and sigmoid mesocolon lymphadenopathy, consistent with metastatic disease. 3.  No evidence of distant metastatic disease.   Original Report Authenticated By: Myles Rosenthal, M.D.      IMPRESSION: Locally advanced adenocarcinoma of the rectum. The patient would be a good candidate for a preoperative course of radiation along with radiosensitizing chemotherapy. I discussed the overall treatment course side effects and potential toxicities of radiation therapy in this situation with the patient and her daughter. The patient appears to understand and wishes to proceed with planned course of treatment with attempts at sphincter preservation.  PLAN: Simulation and planning June 11 at 11 AM.   medical oncology consultation on June 10  I spent 60 minutes minutes face to face with the patient and more than 50% of that time was spent in counseling and/or coordination of care.   ------------------------------------------------  -----------------------------------  Billie Lade, PhD, MD

## 2012-07-27 NOTE — Progress Notes (Signed)
Please see the Nurse Progress Note in the MD Initial Consult Encounter for this patient. 

## 2012-07-27 NOTE — Telephone Encounter (Signed)
CALLED PATIENT TO ASK ABOUT COMING IN EARLIER DUE TO A CANCELLATION, LVM FOR A RETURN CALL

## 2012-07-28 ENCOUNTER — Ambulatory Visit (HOSPITAL_BASED_OUTPATIENT_CLINIC_OR_DEPARTMENT_OTHER): Payer: Medicare Other | Admitting: Oncology

## 2012-07-28 ENCOUNTER — Telehealth: Payer: Self-pay | Admitting: Oncology

## 2012-07-28 ENCOUNTER — Ambulatory Visit: Payer: Medicare Other

## 2012-07-28 ENCOUNTER — Encounter: Payer: Self-pay | Admitting: Oncology

## 2012-07-28 ENCOUNTER — Other Ambulatory Visit: Payer: Self-pay | Admitting: *Deleted

## 2012-07-28 VITALS — BP 110/60 | HR 116 | Temp 98.1°F | Resp 19 | Ht <= 58 in | Wt 149.4 lb

## 2012-07-28 DIAGNOSIS — C20 Malignant neoplasm of rectum: Secondary | ICD-10-CM

## 2012-07-28 DIAGNOSIS — Z8673 Personal history of transient ischemic attack (TIA), and cerebral infarction without residual deficits: Secondary | ICD-10-CM

## 2012-07-28 DIAGNOSIS — I10 Essential (primary) hypertension: Secondary | ICD-10-CM

## 2012-07-28 DIAGNOSIS — C775 Secondary and unspecified malignant neoplasm of intrapelvic lymph nodes: Secondary | ICD-10-CM

## 2012-07-28 DIAGNOSIS — E119 Type 2 diabetes mellitus without complications: Secondary | ICD-10-CM

## 2012-07-28 DIAGNOSIS — G893 Neoplasm related pain (acute) (chronic): Secondary | ICD-10-CM

## 2012-07-28 NOTE — Progress Notes (Signed)
Met with patient and family.  Education provided on rectal cancer.  Referral made to dietician for education and weight loss.  Patient scored 8 on MOD scale, but declines need for SW and relates it to stress concerning her husband and his lack of knowledge about what is happening to her.  [name] present and very supportive.  They stated they would ask for SW assist if needed.  Notified Chris in pharmacy re: Xeloda and need for monitoring.

## 2012-07-28 NOTE — Progress Notes (Signed)
[facility] New Patient Consult   Referring MD: Lemma Tetro 72 y.o.  August 23, 1940    Reason for Referral: Rectal cancer     HPI: She noted a change in bowel habits with loose stool and thin bowel movements. She was evaluated at Dr. Laverle Hobby office on 07/08/2012 and noted to have a rectal mass. She was referred to the emergency room. A rectal mass was confirmed with bleeding. CTs of the chest, abdomen, and pelvis revealed no suspicious pulmonary nodules or masses.. An irregular mass was noted at the rectum with soft tissue stranding and lymphadenopathy in the perirectal space extending superiorly along the sigmoid mesocolon. The largest perirectal lymph node measured 14 m consistent with metastatic disease. No retroperitoneal lymphadenopathy. No liver masses.  She was referred to Dr. Loreta Ave and underwent a colonoscopy on 07/21/2012. A friable ulcerated mass was found in the rectum measuring 10 cm in length. The remainder of examination was unremarkable. The pathology revealed invasive adenocarcinoma, poorly differentiated with signet rings cells in a background of fragments of tubulovillous adenoma. No loss of mismatch repair proteins was noted on immunohistochemical stains. She was referred to Dr. Abbey Chatters the on physical exam a firm fixed mass was noted 3.5 cm from the anal verge. Her case was presented at the GI tumor conference on 07/22/2012. Neoadjuvant chemotherapy/radiation is recommended. She saw Dr. Roselind Messier and is scheduled to undergo radiation simulation on 07/29/2012.  Rectal pain is relieved with hydrocodone.   Past Medical History  Diagnosis Date  . Hypertension   . Diabetes mellitus without complication   . Hyperlipidemia   . Back pain   .  G5 P5    . Hyperlipidemia   . Stroke 2009  . Hemorrhoids   . History of recent fall 07/05/12  . Cancer 07/21/12    rectal, lymphadenopathy    Past Surgical History  Procedure Laterality Date  . Hip  arthroplasty Left 2009    fall injury  . Abdominal hysterectomy      TAH, > 37 yrs ago  . Bladder suspension      many yrs ago    Family History  Problem Relation Age of Onset  . Cancer Sister-smoker   58     lung  . Cancer Daughter     cervical  . Diabetes Mother    5 brothers and 5 sisters. One brother had "skin cancer ". No other family history of cancer. Current outpatient prescriptions:Aloe Vera LIQD, Apply topically., Disp: , Rfl: ;  ALPRAZolam (XANAX) 0.25 MG tablet, Take 0.25 mg by mouth at bedtime as needed for sleep., Disp: , Rfl: ;  docusate sodium (DULCOLAX) 100 MG capsule, Take 300 mg by mouth daily., Disp: , Rfl: ;  feeding supplement (PRO-STAT SUGAR FREE 64) LIQD, Take 30 mLs by mouth 3 (three) times daily with meals., Disp: , Rfl:  HYDROcodone-acetaminophen (NORCO/VICODIN) 5-325 MG per tablet, Take 1 tablet by mouth every 4 (four) hours as needed for pain., Disp: 25 tablet, Rfl: 0;  losartan (COZAAR) 100 MG tablet, Take 100 mg by mouth daily., Disp: , Rfl: ;  metFORMIN (GLUCOPHAGE) 1000 MG tablet, TAKE ONE TABLET IN THE MORNING, ONE-HALF TABLET AT LUNCH AND ONE TABLET IN THE EVENING (NEED APPOINTMENT), Disp: 30 tablet, Rfl: 0 pravastatin (PRAVACHOL) 40 MG tablet, Take 40 mg by mouth daily., Disp: , Rfl:   Allergies:  Allergies  Allergen Reactions  . Ace Inhibitors     REACTION: ? reaction  . Aspirin  REACTION: ? reaction  . Fish Oil     REACTION: GI  . Glipizide     REACTION: hypoglycemia  . Ibuprofen     REACTION: ? reaction  . Rosiglitazone Maleate     REACTION: edema  . Sulfonamide Derivatives     REACTION: ? reaction    Social History: She lives with her husband and Rudolph. She does not use tobacco or alcohol. No transfusion history. No risk factor for HIV or hepatitis.  ROS:   Positives include: Anorexia/10 pound weight loss, urinary hesitancy, loose stool, thin stools, rectal pain  A complete ROS was otherwise negative.  Physical  Exam:  Blood pressure 110/60, pulse 116, temperature 98.1 F (36.7 C), temperature source Oral, resp. rate 19, height 4\' 9"  (1.448 m), weight 149 lb 6.4 oz (67.767 kg).  HEENT: Upper and lower denture plate, oropharynx without visible mass, neck without mass Lungs: Clear bilaterally Cardiac: Regular rate and rhythm Abdomen: No hepatosplenomegaly, no mass, nontender  Vascular: No leg edema Lymph nodes: No cervical, supraclavicular, axillary, or inguinal nodes Neurologic: Alert and oriented, the motor exam appears intact in the upper and lower extremities Skin: No rash Musculoskeletal: No spine tenderness Rectal: Small external hemorrhoids, immediately proximal to the anal verge there is a mass extending proximally beyond the tip of the examination finger. The mass appears centered over the anterior rectum.   LAB:  CBC  Lab Results  Component Value Date   WBC 10.2 07/08/2012   HGB 11.8* 07/08/2012   HCT 36.2 07/08/2012   MCV 65.6* 07/08/2012   PLT 287 07/08/2012     CMP      Component Value Date/Time   NA 132* 07/08/2012 1939   K 3.8 07/08/2012 1939   CL 93* 07/08/2012 1939   CO2 27 07/08/2012 1939   GLUCOSE 262* 07/08/2012 1939   BUN 14 07/08/2012 1939   CREATININE 0.48* 07/08/2012 1939   CALCIUM 9.1 07/08/2012 1939   PROT 6.7 07/08/2012 1939   ALBUMIN 3.2* 07/08/2012 1939   AST 11 07/08/2012 1939   ALT 9 07/08/2012 1939   ALKPHOS 108 07/08/2012 1939   BILITOT 0.3 07/08/2012 1939   GFRNONAA >90 07/08/2012 1939   GFRAA >90 07/08/2012 1939     Radiology: As per history of present illness    Assessment/Plan:   1. Locally advanced (clinically lymph node positive) rectal cancer, CT on 07/08/2012 consistent with a rectal mass and perirectal/sigmoid mesocolon Lymphadenopathy. No evidence of distant metastatic disease.  2. Pain secondary to #1  3. Diabetes  4. Hypertension  5. History of a CVA   Disposition:   Ms. Herzberg has been diagnosed with adenocarcinoma of the rectum.  She appears to have locally advanced disease based on the clinical evaluation to date. Her case was presented at the GI tumor conference on 07/22/2012. Neoadjuvant chemotherapy and radiation was recommended. An endoscopic ultrasound and MRI were not recommended secondary to the CT evidence of metastatic adenopathy.  I recommend concurrent neoadjuvant chemotherapy/radiation. I recommend capecitabine chemotherapy. We reviewed the potential toxicities associated with capecitabine including the chance for mucositis, diarrhea, and hematologic toxicity. We discussed the skin rash, hyperpigmentation, and hand/foot syndrome associated with capecitabine. She will attend a chemotherapy teaching class.  She is scheduled to undergo radiation simulation on 07/29/2012. The plan is to begin concurrent capecitabine and radiation on 08/05/2012. Ms. Gavina will return for an office visit on 08/20/2012.  Approximately 60 minutes were spent with the patient and her family today. The majority of time  was spent in counseling/coordination of care.  Raffael Bugarin 07/28/2012, 7:09 PM

## 2012-07-28 NOTE — Progress Notes (Signed)
Checked in new pt with no financial concerns. °

## 2012-07-28 NOTE — Telephone Encounter (Signed)
gv and printed appt sched and avs for pt  °

## 2012-07-29 ENCOUNTER — Encounter: Payer: Self-pay | Admitting: Oncology

## 2012-07-29 ENCOUNTER — Ambulatory Visit
Admission: RE | Admit: 2012-07-29 | Discharge: 2012-07-29 | Disposition: A | Discharge status: Home / Self Care | Payer: Medicare Other | Source: Ambulatory Visit | Attending: Radiation Oncology | Admitting: Radiation Oncology

## 2012-07-29 VITALS — BP 146/94 | HR 100 | Resp 16

## 2012-07-29 DIAGNOSIS — Z510 Encounter for antineoplastic radiation therapy: Secondary | ICD-10-CM | POA: Insufficient documentation

## 2012-07-29 DIAGNOSIS — N390 Urinary tract infection, site not specified: Secondary | ICD-10-CM | POA: Insufficient documentation

## 2012-07-29 DIAGNOSIS — R609 Edema, unspecified: Secondary | ICD-10-CM | POA: Insufficient documentation

## 2012-07-29 DIAGNOSIS — B370 Candidal stomatitis: Secondary | ICD-10-CM | POA: Insufficient documentation

## 2012-07-29 DIAGNOSIS — C20 Malignant neoplasm of rectum: Secondary | ICD-10-CM

## 2012-07-29 DIAGNOSIS — Z79899 Other long term (current) drug therapy: Secondary | ICD-10-CM | POA: Insufficient documentation

## 2012-07-29 DIAGNOSIS — C775 Secondary and unspecified malignant neoplasm of intrapelvic lymph nodes: Secondary | ICD-10-CM | POA: Insufficient documentation

## 2012-07-29 NOTE — Progress Notes (Signed)
Faxed xeloda prescription to [facility] °

## 2012-07-30 ENCOUNTER — Encounter: Payer: Self-pay | Admitting: *Deleted

## 2012-07-30 ENCOUNTER — Other Ambulatory Visit: Payer: Medicare Other

## 2012-07-30 ENCOUNTER — Telehealth: Payer: Self-pay | Admitting: *Deleted

## 2012-07-30 DIAGNOSIS — C775 Secondary and unspecified malignant neoplasm of intrapelvic lymph nodes: Secondary | ICD-10-CM

## 2012-07-30 DIAGNOSIS — C20 Malignant neoplasm of rectum: Secondary | ICD-10-CM

## 2012-07-30 MED ORDER — OXYCODONE-ACETAMINOPHEN 5-325 MG PO TABS: 1.0000 | ORAL_TABLET | ORAL | Status: DC | PRN

## 2012-07-30 NOTE — Telephone Encounter (Signed)
Call from daughter, Cecilie Kicks, that patient has had increasing pain in rectal area, taking Vicodin q4hours without much relief. She takes Xanax 0.25mg  to help sleep, and even took a second tab last night, and was still awaking every hour. Daughter states patient is miserable, irritable, and "down right mean" Would like to see if there is something stronger for her pain. Spoke with Dr Truett Perna, we will increase her to Percocet 5/325mg . Called back with change in meds, daughter Philbert Riser answered, agreed with previous call from [place] that mother is definitely in pain and does not tell the doctors. Daughters [place] and Babette Relic will be coming today for chemo education on patient behalf, instructed them to pick up Rx with picture ID.

## 2012-07-30 NOTE — Progress Notes (Signed)
  Radiation Oncology         [phone] ________________________________  Name: Rachel Benton MRN: [id number]  Date: 07/29/2012  DOB: [birth year]  SIMULATION AND TREATMENT PLANNING NOTE  DIAGNOSIS:  Locally advanced adenocarcinoma of the rectum  NARRATIVE:  The patient was brought to the CT Simulation planning suite.  Identity was confirmed.  All relevant records and images related to the planned course of therapy were reviewed.  The patient freely provided informed written consent to proceed with treatment after reviewing the details related to the planned course of therapy. The consent form was witnessed and verified by the simulation staff.  Then, the patient was set-up in a stable reproducible  prone position on the "belly board" for radiation therapy.  CT images were obtained.  Surface markings were placed.  The CT images were loaded into the planning software.  Then the target and avoidance structures were contoured.  Treatment planning then occurred.  The radiation prescription was entered and confirmed.  Then, I designed and supervised the construction of a total of 3 medically necessary complex treatment devices.  I have requested : 3D Simulation  I have requested a DVH of the following structures: GTV, bladder, small bowel, femoral head/neck.  I have ordered:dose calc.  PLAN:  The patient will receive 45 Gy in 25 fractions, followed by a boost to the rectal mass to 50.4 gray  ________________________________   Special treatment procedure note  The patient will be receiving radiosensitizing Xeloda oral chemotherapy throughout her course of radiation therapy. Given the increased potential for toxicities as well as the necessity for close monitoring of the patient and bloodwork, this constitutes a special treatment procedure. -----------------------------------  Billie Lade, PhD, MD

## 2012-07-31 ENCOUNTER — Encounter: Payer: Self-pay | Admitting: *Deleted

## 2012-07-31 DIAGNOSIS — C20 Malignant neoplasm of rectum: Secondary | ICD-10-CM

## 2012-07-31 MED ORDER — CAPECITABINE 500 MG PO TABS: ORAL_TABLET | ORAL | Status: DC

## 2012-08-03 ENCOUNTER — Other Ambulatory Visit: Payer: Self-pay | Admitting: *Deleted

## 2012-08-03 ENCOUNTER — Telehealth: Payer: Self-pay | Admitting: *Deleted

## 2012-08-03 ENCOUNTER — Encounter: Payer: Self-pay | Admitting: Oncology

## 2012-08-03 MED ORDER — CYCLOBENZAPRINE HCL 5 MG PO TABS: 5.0000 mg | ORAL_TABLET | Freq: Two times a day (BID) | ORAL | Status: DC | PRN

## 2012-08-03 NOTE — Progress Notes (Signed)
Per PAN patient was approved for Xeloda  6/12/1-6/12/2013   7500.00. I will forward to billing and medical records.

## 2012-08-03 NOTE — Telephone Encounter (Signed)
Pt daughter, Marjie Skiff (on ROI form) asking for script for Flexeril 5mg .  States that when "pt fell few weeks ago, she has knot on her back and she has a lot of back pain"  Daughter states that pain med is not helping but she had a few Flexeril; and that has helped without taking any pain med.  Also wanted to know "if she can have script for Flexeril, is it OK to take with Xeloda and radiation tx?"  Note to MD.

## 2012-08-03 NOTE — Progress Notes (Signed)
RECEIVED A FAX FROM [facility] CONCERNING A CONFIRMATION OF PRESCRIPTION SHIPMENT FOR CAPECITABINE ON 07/31/12.

## 2012-08-03 NOTE — Telephone Encounter (Signed)
Called and spoke with daughter Dois Davenport; per Dr. Truett Perna script sent for Flexeril and instructed this should not be taken with Xanax.  Pt daughter verbalized understanding.

## 2012-08-04 ENCOUNTER — Telehealth: Payer: Self-pay | Admitting: *Deleted

## 2012-08-04 ENCOUNTER — Encounter: Payer: Self-pay | Admitting: *Deleted

## 2012-08-04 ENCOUNTER — Ambulatory Visit
Admission: RE | Admit: 2012-08-04 | Discharge: 2012-08-04 | Disposition: A | Discharge status: Home / Self Care | Payer: Medicare Other | Source: Ambulatory Visit | Attending: Radiation Oncology | Admitting: Radiation Oncology

## 2012-08-04 DIAGNOSIS — C775 Secondary and unspecified malignant neoplasm of intrapelvic lymph nodes: Secondary | ICD-10-CM

## 2012-08-04 DIAGNOSIS — C20 Malignant neoplasm of rectum: Secondary | ICD-10-CM

## 2012-08-04 MED ORDER — FLUCONAZOLE 100 MG PO TABS: 100.0000 mg | ORAL_TABLET | Freq: Every day | ORAL | Status: DC

## 2012-08-04 MED ORDER — ONDANSETRON HCL 4 MG PO TABS: 4.0000 mg | ORAL_TABLET | Freq: Three times a day (TID) | ORAL | Status: DC | PRN

## 2012-08-04 NOTE — Telephone Encounter (Signed)
Patient has thrush in mouth and throat hurts. Asking for specific directions on how to take the Xeloda pills. Asking about how often she can take the pain pill (oxycodone) and the flexeril 5 mg. Pain not controlled w/Percocet every 4 hours and Fexeril q 12 hours. Asking if frequency of these can be increased? Says patient is alert and not sedated by current meds. Also requests her to have prn med for nausea-daughter asking for phenergan. Suggested it may be helpful to have something not quite as sedating, such as Zofran. Will discuss with MD.

## 2012-08-04 NOTE — Progress Notes (Signed)
This afternoon I met with Ms. Rachel Benton because of the score she received on the measure of distress questionaire form. With Rachel Benton daughter available, I assessed for needs. Rachel Benton has outstanding support having a spouse, 4 daughters, 1 son, and a daughter-in-law. The only need identified was that of prescription assistance, but the daughter was able to identify a grant that has helped. Rachel Benton mood was pleasant and she was positive about her support at home. She could not identify any other needs, but understood social work was available to her upon request. She and her daughter thanked me for stopping by today.  Gretta Cool, LCSW Assistant Director Clinical Social Work [phone]

## 2012-08-04 NOTE — Telephone Encounter (Signed)
Per Dr. Truett Perna : Diflucan 100 mg daily x 5 days. May use Zofran 4 mg every 8 hours for nausea prn. Prefers not to add another sedating medication. May increase Flexeril to every 8 hours for now. Daughter notified. Scripts to [facility] since [facility] is closed.

## 2012-08-04 NOTE — Progress Notes (Signed)
  Radiation Oncology         [phone] ________________________________  Name: Rachel Benton MRN: [id number]  Date: 08/04/2012  DOB: 12-21-1940  Simulation Verification Note  Status: outpatient  NARRATIVE: The patient was brought to the treatment unit and placed in the planned treatment position. The clinical setup was verified. Then port films were obtained and uploaded to the radiation oncology medical record software.  The treatment beams were carefully compared against the planned radiation fields. The position location and shape of the radiation fields was reviewed. They targeted volume of tissue appears to be appropriately covered by the radiation beams. Organs at risk appear to be excluded as planned.  Based on my personal review, I approved the simulation verification. The patient's treatment will proceed as planned.  -----------------------------------  Billie Lade, PhD, MD

## 2012-08-05 ENCOUNTER — Ambulatory Visit: Payer: Medicare Other

## 2012-08-05 ENCOUNTER — Encounter: Payer: Self-pay | Admitting: Oncology

## 2012-08-05 ENCOUNTER — Ambulatory Visit
Admission: RE | Admit: 2012-08-05 | Discharge: 2012-08-05 | Disposition: A | Discharge status: Home / Self Care | Payer: Medicare Other | Source: Ambulatory Visit | Attending: Radiation Oncology | Admitting: Radiation Oncology

## 2012-08-05 NOTE — Progress Notes (Signed)
[facility], [id number], for cyclobenzaprine 5mg  pa, per rep insurance will not cover this drug with or without a prior auth.  Her insurance prefers she uses meloxicam, diclofenac, or tizanidine; informed nurse.

## 2012-08-06 ENCOUNTER — Encounter: Payer: Self-pay | Admitting: *Deleted

## 2012-08-06 ENCOUNTER — Ambulatory Visit
Admission: RE | Admit: 2012-08-06 | Discharge: 2012-08-06 | Disposition: A | Discharge status: Home / Self Care | Payer: Medicare Other | Source: Ambulatory Visit | Attending: Radiation Oncology | Admitting: Radiation Oncology

## 2012-08-06 VITALS — BP 108/74 | HR 120 | Temp 98.0°F | Resp 16

## 2012-08-06 DIAGNOSIS — C775 Secondary and unspecified malignant neoplasm of intrapelvic lymph nodes: Secondary | ICD-10-CM

## 2012-08-06 NOTE — Progress Notes (Signed)
Rachel Benton in today requesting a change in her antiemetic, Ondansetron, since she was informed by her pharmacy that there is a drug interaction ( cardiac arrythmia) between Ondansetron and Diflucan.  Dr. Roselind Messier informed and script changed to Compazine.  During wait for Dr. Roselind Messier, Rachel Benton began to sweat with obvious moisture in the upper chest region and back of neck. She reported that this is not unusual for her , but seems to be more frequent in the past 'few" days. Note elevation of pulse of 129 and 120 respectively.  O2 sat 91%. Her pulse remained regular and this was confirmed by Dr. Roselind Messier.   A cool cloth was placed on back the of her neck and she remained in the clinic for ~20 minutes.  Disposition to home with Daughter with instructions to call if she feels there is a decline in Rachel Benton' status.  She stated understanding.

## 2012-08-06 NOTE — Progress Notes (Signed)
MD noted messaged from managed care department regarding insurance will not cover flexeril and alternative choices given. Per Dr. Truett Perna: does not want to give substitute. D/C flexeril if patient does not want to take it. She has Vicodin for pain she can take.

## 2012-08-07 ENCOUNTER — Ambulatory Visit: Payer: Medicare Other

## 2012-08-07 ENCOUNTER — Telehealth: Payer: Self-pay | Admitting: *Deleted

## 2012-08-07 ENCOUNTER — Telehealth: Payer: Self-pay | Admitting: Dietician

## 2012-08-07 NOTE — Telephone Encounter (Signed)
[name] daughter called and stated that her sisters noted that her increased sweating started when she began taking Flexeril.  The side-effect profile for  Flexeril revealed that Increased sweating and a an elevated HR are considered as serious side-effects. Besides sweating on yesterday, Rachel Benton also had an elevated HR or 129 and 120.    Dr. Truett Perna and his nurse, Ulla Potash, were notified and Rachel Benton daughter, Rachel Benton was called and informed per Dr. Truett Perna to discontinue Flexeril and use her oxycodone to manage her back pain.  Rachel Benton is worried that this will not manage her pain effectively, but informed that this is the safest route for Rachel Benton, and that she can call over the weekend if needed.

## 2012-08-07 NOTE — Telephone Encounter (Signed)
Brief Outpatient Oncology Nutrition Note  Patient has been identified to be at risk on malnutrition screen.  Wt Readings from Last 10 Encounters:  07/28/12 149 lb 6.4 oz (67.767 kg)  07/27/12 146 lb 8 oz (66.452 kg)  07/22/12 146 lb 9.6 oz (66.497 kg)  11/01/09 166 lb 8 oz (75.524 kg)  09/11/09 171 lb 8 oz (77.792 kg)  05/16/09 178 lb 8 oz (80.967 kg)  02/01/09 180 lb (81.647 kg)  11/15/08 178 lb (80.74 kg)  08/02/08 186 lb (84.369 kg)  06/24/08 187 lb (84.823 kg)      Called regarding patient's weight loss.  Patient with rectar cancer.  Spoke with the patient's daughter.  Patient ate an egg and small amount of a biscuit this am.  Daughter making protein shake with prostat, almond milk, ovaltine, aloe vera which patient takes well.  Problems swallowing prior to cancer dx.  Daughters working to try to increase patient's nutritional intake.  Will mail patient handouts on swallowing info and tips to increase calories and protein with [facility] contact information.  Oran Rein, RD, LDN

## 2012-08-10 ENCOUNTER — Ambulatory Visit: Payer: Medicare Other

## 2012-08-10 ENCOUNTER — Encounter (INDEPENDENT_AMBULATORY_CARE_PROVIDER_SITE_OTHER): Payer: Self-pay

## 2012-08-10 ENCOUNTER — Ambulatory Visit: Admission: RE | Admit: 2012-08-10 | Payer: Medicare Other | Source: Ambulatory Visit

## 2012-08-11 ENCOUNTER — Other Ambulatory Visit: Payer: Self-pay | Admitting: Radiation Oncology

## 2012-08-11 ENCOUNTER — Ambulatory Visit: Payer: Medicare Other

## 2012-08-11 DIAGNOSIS — C775 Secondary and unspecified malignant neoplasm of intrapelvic lymph nodes: Secondary | ICD-10-CM

## 2012-08-11 DIAGNOSIS — C20 Malignant neoplasm of rectum: Secondary | ICD-10-CM

## 2012-08-11 MED ORDER — OXYCODONE-ACETAMINOPHEN 5-325 MG PO TABS: 2.0000 | ORAL_TABLET | Freq: Four times a day (QID) | ORAL | Status: DC | PRN

## 2012-08-12 ENCOUNTER — Telehealth: Payer: Self-pay | Admitting: *Deleted

## 2012-08-12 ENCOUNTER — Ambulatory Visit: Payer: Medicare Other

## 2012-08-12 NOTE — Telephone Encounter (Signed)
Returned call from pt's daughter,Tammy Renee Harder asking for instructions re: Xeloda dosing, schedule. Pt did not receive radiation tx yesterday due to lina down, but pt may be treated today. Per Juluis Mire, Pharm D, pt should take medication at same time every day and continue to take on weekends to maintain constant blood level. This information was relayed to daughter. Ms Renee Harder verbalized understanding. She was connected to radiation treatment area to discuss pt's treatment time for today.

## 2012-08-13 ENCOUNTER — Ambulatory Visit
Admission: RE | Admit: 2012-08-13 | Discharge: 2012-08-13 | Disposition: A | Discharge status: Home / Self Care | Payer: Medicare Other | Source: Ambulatory Visit | Attending: Radiation Oncology | Admitting: Radiation Oncology

## 2012-08-13 VITALS — Wt 145.4 lb

## 2012-08-13 DIAGNOSIS — C20 Malignant neoplasm of rectum: Secondary | ICD-10-CM

## 2012-08-13 DIAGNOSIS — C775 Secondary and unspecified malignant neoplasm of intrapelvic lymph nodes: Secondary | ICD-10-CM

## 2012-08-13 NOTE — Progress Notes (Signed)
Post sim ed completed w/pt and daughter. Gave pt "Radiation and You" booklet w/all pertinent information marked and discussed, [id number],  fatigue, nausea/vomiting, skin irritation/care, rectal irritation/management, urinary irritation/management, nutrition, pain. Pt will be scheduled w/nutritionist.  All questions answered; pt and daughter verbalized understanding. Teach back method used.

## 2012-08-14 ENCOUNTER — Encounter: Payer: Self-pay | Admitting: *Deleted

## 2012-08-14 ENCOUNTER — Encounter: Payer: Self-pay | Admitting: Radiation Oncology

## 2012-08-14 ENCOUNTER — Ambulatory Visit
Admission: RE | Admit: 2012-08-14 | Discharge: 2012-08-14 | Disposition: A | Discharge status: Home / Self Care | Payer: Medicare Other | Source: Ambulatory Visit | Attending: Radiation Oncology | Admitting: Radiation Oncology

## 2012-08-14 DIAGNOSIS — C775 Secondary and unspecified malignant neoplasm of intrapelvic lymph nodes: Secondary | ICD-10-CM

## 2012-08-14 DIAGNOSIS — C801 Malignant (primary) neoplasm, unspecified: Secondary | ICD-10-CM

## 2012-08-14 MED ORDER — OXYCODONE-ACETAMINOPHEN 5-325 MG PO TABS
1.0000 | ORAL_TABLET | Freq: Once | ORAL | Status: AC
  Administered 2012-08-14: 1 via ORAL
  Filled 2012-08-14: qty 1

## 2012-08-14 MED ORDER — OXYCODONE-ACETAMINOPHEN 5-325 MG PO TABS: 1.0000 | ORAL_TABLET | Freq: Once | ORAL | Status: DC

## 2012-08-14 NOTE — Progress Notes (Signed)
Weekly Management Note Current Dose: 7.2 Gy  Projected Dose: 50.4 Gy   Narrative:  The patient presents for routine under treatment assessment.  CBCT/MVCT images/Port film x-rays were reviewed.  The chart was checked. Pt taking 5 mg of oxycodone q 3 hours. Works for 2 hours then wears off. Terrible pain at night and 1 hour after taking xeloda. Leaking right now so pain is increased. No nausea.  Would like to restart muscle relaxers for flank pain due to fall.   Physical Findings:  Alert, in pain.   Vitals: There were no vitals filed for this visit. Weight:  Wt Readings from Last 3 Encounters:  08/13/12 145 lb 6.4 oz (65.953 kg)  07/28/12 149 lb 6.4 oz (67.767 kg)  07/27/12 146 lb 8 oz (66.452 kg)   Lab Results  Component Value Date   WBC 10.2 07/08/2012   HGB 11.8* 07/08/2012   HCT 36.2 07/08/2012   MCV 65.6* 07/08/2012   PLT 287 07/08/2012   Lab Results  Component Value Date   CREATININE 0.48* 07/08/2012   BUN 14 07/08/2012   NA 132* 07/08/2012   K 3.8 07/08/2012   CL 93* 07/08/2012   CO2 27 07/08/2012     Impression:  The patient is tolerating radiation.  Plan:  Continue treatment as planned. OK to take 2 oxycodone tonight. Discuss long acting pain meds on Monday with JK at PUT visit. Give percocet x 1 for ride home.

## 2012-08-14 NOTE — Progress Notes (Signed)
patient c/o pain  Rectum an 8 on 1-10 scale, depends, towels wash cloth given to patient, , written and verbal orders to give patient 1 percocet 5/325mg , patient medication verifed with Percell Boston, when went to scan med, said "Read only,", so had to put in as a verbal and sign by Md, patient and daughter left after taking percocet ,couldn't re-assess pain 3:27 PM

## 2012-08-15 ENCOUNTER — Ambulatory Visit
Admission: RE | Admit: 2012-08-15 | Discharge: 2012-08-15 | Disposition: A | Discharge status: Home / Self Care | Payer: Medicare Other | Source: Ambulatory Visit | Attending: Radiation Oncology | Admitting: Radiation Oncology

## 2012-08-17 ENCOUNTER — Encounter: Payer: Self-pay | Admitting: Radiation Oncology

## 2012-08-17 ENCOUNTER — Ambulatory Visit
Admission: RE | Admit: 2012-08-17 | Discharge: 2012-08-17 | Disposition: A | Discharge status: Home / Self Care | Payer: Medicare Other | Source: Ambulatory Visit | Attending: Radiation Oncology | Admitting: Radiation Oncology

## 2012-08-17 ENCOUNTER — Other Ambulatory Visit: Payer: Self-pay | Admitting: Radiation Oncology

## 2012-08-17 VITALS — BP 106/68 | HR 113 | Temp 97.6°F | Resp 20 | Wt 145.2 lb

## 2012-08-17 DIAGNOSIS — C20 Malignant neoplasm of rectum: Secondary | ICD-10-CM

## 2012-08-17 DIAGNOSIS — C775 Secondary and unspecified malignant neoplasm of intrapelvic lymph nodes: Secondary | ICD-10-CM

## 2012-08-17 MED ORDER — FENTANYL 25 MCG/HR TD PT72: 1.0000 | MEDICATED_PATCH | TRANSDERMAL | Status: DC

## 2012-08-17 MED ORDER — OXYCODONE-ACETAMINOPHEN 5-325 MG PO TABS: 1.0000 | ORAL_TABLET | Freq: Four times a day (QID) | ORAL | Status: DC | PRN

## 2012-08-17 NOTE — Progress Notes (Signed)
Pt states she had rough weekend w/pain in her rectum, not sleeping well due to pain. She is taking Oxycodone q 4 hrs, taking 2 tabs q 3 hours at night. She states pain is still not well controlled. Pt very fatigued, loss of appetite, states she cannot eat solid foods. She is drinking nutritional supplements, 3-5 cans daily. Daughter states diarrhea is firming up, becoming brown, not liquid any more. Pt not taking Imodium. Pt continues to have rash on rectal area from frequent stools. Daughter will discuss Duragesic patch w/Dr Roselind Messier today. She feels pain med does not stay in pt's system long enough to give good relief.

## 2012-08-17 NOTE — Progress Notes (Signed)
[facility]    Radiation Oncology [address] [place]     Maryln Gottron, M.D. [place], [place] [address]               Billie Lade, M.D., Ph.D. Phone: [phone]      Molli Hazard A. Kathrynn Running, M.D. Fax: [phone]      Radene Gunning, M.D., Ph.D.         Lurline Hare, M.D.         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  Current Dose: 10.8 Gy  Current Fraction: 6  Planned Dose: 50.4 Gy  Narrative: Reymundo Poll was seen today for weekly treatment management. The chart was checked and port films  were reviewed. She is tolerating the treatments well thus far. She however continues to have significant pain which awakens her at night. Her pain is not controlled well with oxycodone. The patient will be placed on Duragesic.  She is having more formed bowel movements. She denies any rectal bleeding.  Ace inhibitors; Aspirin; Fish oil; Glipizide; Ibuprofen; Rosiglitazone maleate; and Sulfonamide derivatives Current Outpatient Prescriptions  Medication Sig Dispense Refill  . Aloe Vera LIQD Apply topically.      . ALPRAZolam (XANAX) 0.25 MG tablet Take 0.25 mg by mouth at bedtime as needed for sleep.      . capecitabine (XELODA) 500 MG tablet Take #3 (1500mg ) po every am & #2 (1000mg ) po every pm = 2500 mg total daily dose Take on days of radiation therapy only M-F  140 tablet  0  . docusate sodium (DULCOLAX) 100 MG capsule Take 300 mg by mouth daily.      . feeding supplement (PRO-STAT SUGAR FREE 64) LIQD Take 30 mLs by mouth 3 (three) times daily with meals.      Marland Kitchen HYDROcodone-acetaminophen (NORCO/VICODIN) 5-325 MG per tablet Take 1 tablet by mouth every 4 (four) hours as needed for pain.  25 tablet  0   . losartan (COZAAR) 100 MG tablet Take 100 mg by mouth daily.      . metFORMIN (GLUCOPHAGE) 1000 MG tablet TAKE ONE TABLET IN THE MORNING, ONE-HALF TABLET AT LUNCH AND ONE TABLET IN THE EVENING (NEED APPOINTMENT)  30 tablet  0  . ondansetron (ZOFRAN) 4 MG tablet Take 1 tablet (4 mg total) by mouth every 8 (eight) hours as needed for nausea.  20 tablet  0  . oxyCODONE-acetaminophen (PERCOCET/ROXICET) 5-325 MG per tablet Take 2 tablets by mouth every 6 (six) hours as needed for pain.  60 tablet  0  . pravastatin (PRAVACHOL) 40 MG tablet Take 40 mg by mouth daily.      . fentaNYL (DURAGESIC - DOSED MCG/HR) 25 MCG/HR Place 1 patch (25 mcg total) onto the skin every 3 (three) days.  5 patch  0   No current facility-administered medications for this encounter.   Labs:  Lab Results  Component Value Date   WBC 10.2 07/08/2012   HGB 11.8* 07/08/2012   HCT 36.2 07/08/2012   MCV 65.6* 07/08/2012   PLT 287 07/08/2012   Lab Results  Component Value Date   CREATININE 0.48* 07/08/2012  BUN 14 07/08/2012   NA 132* 07/08/2012   K 3.8 07/08/2012   CL 93* 07/08/2012   CO2 27 07/08/2012   Lab Results  Component Value Date   ALT 9 07/08/2012   AST 11 07/08/2012   PHOS 4.5 08/15/2009   BILITOT 0.3 07/08/2012    Physical Examination:  weight is 145 lb 3.2 oz (65.862 kg). Her oral temperature is 97.6 F (36.4 C). Her blood pressure is 106/68 and her pulse is 113. Her respiration is 20.    Wt Readings from Last 3 Encounters:  08/17/12 145 lb 3.2 oz (65.862 kg)  08/13/12 145 lb 6.4 oz (65.953 kg)  07/28/12 149 lb 6.4 oz (67.767 kg)    The oral cavity is moist without secondary infection. Lungs - Normal respiratory effort, chest expands symmetrically. Lungs are clear to auscultation, no crackles or wheezes.  Heart has regular rhythm and rate  Abdomen is soft and non tender with normal bowel sounds  Assessment:  Patient tolerating treatments well  Plan: Continue treatment per original radiation  prescription

## 2012-08-18 ENCOUNTER — Telehealth: Payer: Self-pay | Admitting: *Deleted

## 2012-08-18 ENCOUNTER — Ambulatory Visit
Admission: RE | Admit: 2012-08-18 | Discharge: 2012-08-18 | Disposition: A | Discharge status: Home / Self Care | Payer: Medicare Other | Source: Ambulatory Visit | Attending: Radiation Oncology | Admitting: Radiation Oncology

## 2012-08-18 DIAGNOSIS — C775 Secondary and unspecified malignant neoplasm of intrapelvic lymph nodes: Secondary | ICD-10-CM

## 2012-08-18 DIAGNOSIS — C20 Malignant neoplasm of rectum: Secondary | ICD-10-CM

## 2012-08-18 LAB — BASIC METABOLIC PANEL (CC13)
CO2: 31 mEq/L — ABNORMAL HIGH (ref 22–29)
Calcium: 8.9 mg/dL (ref 8.4–10.4)
Creatinine: 0.6 mg/dL (ref 0.6–1.1)
Sodium: 134 mEq/L — ABNORMAL LOW (ref 136–145)

## 2012-08-18 LAB — CBC WITH DIFFERENTIAL/PLATELET
BASO%: 0.2 % (ref 0.0–2.0)
EOS%: 1.4 % (ref 0.0–7.0)
HCT: 35.8 % (ref 34.8–46.6)
LYMPH%: 12.2 % — ABNORMAL LOW (ref 14.0–49.7)
MCH: 22.1 pg — ABNORMAL LOW (ref 25.1–34.0)
MCHC: 32.2 g/dL (ref 31.5–36.0)
MONO#: 0.3 10*3/uL (ref 0.1–0.9)
NEUT%: 79.1 % — ABNORMAL HIGH (ref 38.4–76.8)
Platelets: 294 10*3/uL (ref 145–400)
RBC: 5.23 10*6/uL (ref 3.70–5.45)
WBC: 4.6 10*3/uL (ref 3.9–10.3)
lymph#: 0.6 10*3/uL — ABNORMAL LOW (ref 0.9–3.3)

## 2012-08-18 NOTE — Telephone Encounter (Signed)
Spoke w/pt's daughter re: pain management. She states pt is wearing Duragesic patch which was first placed yesterday afternoon, states "pain may be a bit better". She gave pt Oxycodone 5-325 mg, 1 tab prior to radiation tx today. She states pt is taking sitz baths w/epsom salts. She states her skin in rectal area "is sore". Advised she try A&D ointment but must be washed off prior to radiation treatments. Advised she notify this RN if pt's pain is not improved later this week. Daughter verbalized understanding.

## 2012-08-18 NOTE — Telephone Encounter (Signed)
CALLED PATIENT TO ASK TO COME IN EARLY FOR LABS TODAY AT 1 PM, SPOKE WITH HER DAUGHTER TAMMY AND SHE WILL HAVE HER HERE AT 1 PM TODAY

## 2012-08-19 ENCOUNTER — Telehealth: Payer: Self-pay | Admitting: *Deleted

## 2012-08-19 ENCOUNTER — Ambulatory Visit
Admission: RE | Admit: 2012-08-19 | Discharge: 2012-08-19 | Disposition: A | Discharge status: Home / Self Care | Payer: Medicare Other | Source: Ambulatory Visit | Attending: Radiation Oncology | Admitting: Radiation Oncology

## 2012-08-19 NOTE — Telephone Encounter (Signed)
Call from pt's daughter reporting pt seems to be getting weaker every day. Pt is currently taking Xeloda/ Radiation.Thinks she needs a wheelchair. Pt is scheduled for office visit 7/3. Will evaluate at that time. She also reports her sister (one of pt's caregivers) has been diagnosed with shingles. Has began taking antivirals. Recommended she limit contact with pt until rash dries up. She voiced understanding.

## 2012-08-20 ENCOUNTER — Ambulatory Visit (HOSPITAL_BASED_OUTPATIENT_CLINIC_OR_DEPARTMENT_OTHER): Payer: Medicare Other | Admitting: Oncology

## 2012-08-20 ENCOUNTER — Telehealth: Payer: Self-pay | Admitting: *Deleted

## 2012-08-20 ENCOUNTER — Other Ambulatory Visit: Payer: Self-pay | Admitting: *Deleted

## 2012-08-20 ENCOUNTER — Ambulatory Visit: Payer: Medicare Other | Admitting: Nutrition

## 2012-08-20 ENCOUNTER — Ambulatory Visit
Admission: RE | Admit: 2012-08-20 | Discharge: 2012-08-20 | Disposition: A | Discharge status: Home / Self Care | Payer: Medicare Other | Source: Ambulatory Visit | Attending: Radiation Oncology | Admitting: Radiation Oncology

## 2012-08-20 VITALS — BP 92/55 | HR 105 | Temp 97.6°F | Resp 17 | Ht <= 58 in | Wt 147.0 lb

## 2012-08-20 DIAGNOSIS — C20 Malignant neoplasm of rectum: Secondary | ICD-10-CM

## 2012-08-20 DIAGNOSIS — C775 Secondary and unspecified malignant neoplasm of intrapelvic lymph nodes: Secondary | ICD-10-CM

## 2012-08-20 NOTE — Telephone Encounter (Signed)
appts made and printed...td 

## 2012-08-20 NOTE — Progress Notes (Signed)
   [facility]    OFFICE PROGRESS NOTE   INTERVAL HISTORY:   She returns as scheduled. She is being treated with radiation and concurrent capecitabine. No significant hand/foot pain, nausea, or diarrhea. No mouth ulcers, but she may have "thrush ". She continues to have pain at the low back despite a Duragesic patch. She takes oxycodone several times per day.  Objective:  Vital signs in last 24 hours:  Blood pressure 92/55, pulse 105, temperature 97.6 F (36.4 C), temperature source Oral, resp. rate 17, height 4\' 9"  (1.448 m), weight 147 lb (66.679 kg), SpO2 95.00%.    HEENT: ? Mild right buccal thrush. No ulcers. Resp: Lungs clear bilaterally Cardio: Regular rate and rhythm GI: No hepatomegaly, nontender Vascular: Trace pitting edema at the ankles  Skin: Mild radiation hyperpigmentation at the perineum. No erythema or skin breakdown     Lab Results:  Lab Results  Component Value Date   WBC 4.6 08/18/2012   HGB 11.5* 08/18/2012   HCT 35.8 08/18/2012   MCV 68.5* 08/18/2012   PLT 294 08/18/2012      Medications: I have reviewed the patient's current medications.  Assessment/Plan: 1. Locally advanced (clinically lymph node positive) rectal cancer, CT on 07/08/2012 consistent with a rectal mass and perirectal/sigmoid mesocolon Lymphadenopathy. No evidence of distant metastatic disease.  2. Pain secondary to #1  3. Diabetes  4. Hypertension  5. History of a CVA  6. Microcytic anemia-? Iron deficiency, we will add a ferritin level to the labs from 08/18/2012   Disposition:  She appears to be tolerating the treatment well. She continues to have significant pain and generalized weakness. She requests a wheelchair for home. The plan is to continue concurrent radiation and capecitabine. Ms.Basey will return for an office visit on 09/08/2012. We will see her sooner as needed. The blood pressure has been running low. She will discontinue losartan for now.   Thornton Papas, MD  08/20/2012  11:41 AM

## 2012-08-20 NOTE — Progress Notes (Signed)
Patient is a 72 year old female diagnosed with Rectal Cancer.  Past Medical History includes: Diabetes, hyperlipidemia, anxiety, HTN, CVA, Hip fracture  Medications include Xanax, Xeloda, dulcolax, Prostat, diflucan, glucophage, zofran, pravachol.  Labs were reviewed.  Height:  57 inches. Weight: 147 pounds. Usual body weight:  160 pounds. BMI:  31.8.  Patient is trying to manage blood sugars by keeping CHO controlled.  She cannot eat well so has been drinking a homemade shake made by her family consisting of almond milk, prostat, Ovaltine, and Carnation Instant breakfast.  Patient wishes to avoid, artificial sweeteners.  She does not tolerate dairy products and states she has diarrhea after drinking milk products.  She also does not tolerate oral nutrition supplements such as Glucerna or boost glucose control.  Patient's weight has increased over the last week by 2 pounds.  She is drinking 3-4 of these shakes prepared by her family daily.  She is eating small bites of food.  Nutrition diagnosis: Inadequate oral intake related to poor appetite and difficulty chewing and swallowing as evidenced by 8% percent weight loss from usual body weight.  Intervention: I educated patient and daughter to attempt to consume soft, moist foods that are easier to swallow.  I provided education on a lactose free diet.  I've encouraged patient to continue drinking homemade shakes, but suggested they may want to substitute soymilk instead of Almond milk for additional protein.  I provided multiple fact sheets and coupons for Carnation breakfast essentials.  Questions answered.  Teach back method used.  Monitoring, evaluation, goals: Patient will tolerate adequate calories and protein to promote weight maintenance throughout treatment.  Next visit: Wednesday, July 9, before radiation therapy.

## 2012-08-24 ENCOUNTER — Encounter: Payer: Self-pay | Admitting: *Deleted

## 2012-08-24 ENCOUNTER — Other Ambulatory Visit: Payer: Self-pay | Admitting: Radiation Oncology

## 2012-08-24 ENCOUNTER — Other Ambulatory Visit (HOSPITAL_BASED_OUTPATIENT_CLINIC_OR_DEPARTMENT_OTHER): Payer: Medicare Other | Admitting: Lab

## 2012-08-24 ENCOUNTER — Telehealth: Payer: Self-pay | Admitting: *Deleted

## 2012-08-24 ENCOUNTER — Ambulatory Visit
Admission: RE | Admit: 2012-08-24 | Discharge: 2012-08-24 | Disposition: A | Discharge status: Home / Self Care | Payer: Medicare Other | Source: Ambulatory Visit | Attending: Radiation Oncology | Admitting: Radiation Oncology

## 2012-08-24 DIAGNOSIS — R300 Dysuria: Secondary | ICD-10-CM

## 2012-08-24 DIAGNOSIS — C775 Secondary and unspecified malignant neoplasm of intrapelvic lymph nodes: Secondary | ICD-10-CM

## 2012-08-24 DIAGNOSIS — C20 Malignant neoplasm of rectum: Secondary | ICD-10-CM

## 2012-08-24 LAB — URINALYSIS, MICROSCOPIC - CHCC
RBC / HPF: NEGATIVE (ref 0–2)
Specific Gravity, Urine: 1.015 (ref 1.003–1.035)

## 2012-08-24 NOTE — Progress Notes (Signed)
RECEIVED A FAX FROM [facility] CONCERNING A CONFIRMATION OF PRESCRIPTION SHIPMENT FOR CAPECITABINE ON 08/20/12. 

## 2012-08-24 NOTE — Telephone Encounter (Signed)
Called to report she thinks patient may have thrush again and a possible UTI. Asking if this can be checked when she comes in for radiation today? Will order a U/A after her radiation treatment.

## 2012-08-25 ENCOUNTER — Encounter: Payer: Self-pay | Admitting: Radiation Oncology

## 2012-08-25 ENCOUNTER — Ambulatory Visit
Admission: RE | Admit: 2012-08-25 | Discharge: 2012-08-25 | Disposition: A | Discharge status: Home / Self Care | Payer: Medicare Other | Source: Ambulatory Visit | Attending: Radiation Oncology | Admitting: Radiation Oncology

## 2012-08-25 ENCOUNTER — Telehealth: Payer: Self-pay | Admitting: Oncology

## 2012-08-25 ENCOUNTER — Other Ambulatory Visit: Payer: Self-pay | Admitting: *Deleted

## 2012-08-25 ENCOUNTER — Telehealth: Payer: Self-pay | Admitting: *Deleted

## 2012-08-25 VITALS — BP 102/62 | HR 55 | Temp 98.1°F | Resp 20 | Wt 145.7 lb

## 2012-08-25 DIAGNOSIS — N390 Urinary tract infection, site not specified: Secondary | ICD-10-CM

## 2012-08-25 DIAGNOSIS — R300 Dysuria: Secondary | ICD-10-CM

## 2012-08-25 DIAGNOSIS — C20 Malignant neoplasm of rectum: Secondary | ICD-10-CM

## 2012-08-25 DIAGNOSIS — C775 Secondary and unspecified malignant neoplasm of intrapelvic lymph nodes: Secondary | ICD-10-CM

## 2012-08-25 MED ORDER — PROCHLORPERAZINE MALEATE 10 MG PO TABS: 10.0000 mg | ORAL_TABLET | Freq: Four times a day (QID) | ORAL | Status: DC | PRN

## 2012-08-25 MED ORDER — CIPROFLOXACIN HCL 500 MG PO TABS: 500.0000 mg | ORAL_TABLET | Freq: Two times a day (BID) | ORAL | Status: DC

## 2012-08-25 MED ORDER — FLUCONAZOLE 100 MG PO TABS: 100.0000 mg | ORAL_TABLET | Freq: Every day | ORAL | Status: DC

## 2012-08-25 MED ORDER — FENTANYL 25 MCG/HR TD PT72: 1.0000 | MEDICATED_PATCH | TRANSDERMAL | Status: DC

## 2012-08-25 MED ORDER — OXYCODONE-ACETAMINOPHEN 5-325 MG PO TABS: 1.0000 | ORAL_TABLET | Freq: Four times a day (QID) | ORAL | Status: DC | PRN

## 2012-08-25 NOTE — Telephone Encounter (Signed)
, °

## 2012-08-25 NOTE — Telephone Encounter (Signed)
Message copied by Wandalee Ferdinand on Tue Aug 25, 2012  1:22 PM ------      Message from: Thornton Papas B      Created: Mon Aug 24, 2012  7:48 PM       Please call patient, UA c/w UTI, start cipro 500mg  bid for 5 days,send urine culture ------

## 2012-08-25 NOTE — Telephone Encounter (Signed)
Made daughter aware that U/A is showing bacteria in urine. Sending off for culture. Needs to begin Cipro 500 mg bid X 5 days and push fluids. She understands and agrees. En route to RT now-having some diarrhea today.

## 2012-08-25 NOTE — Progress Notes (Signed)
Pt states her rectal pain has improved, diarrhea off and on but less than last week. Pt drinking many types nutritional supplements, daughter adds Prostat to liquids. Pt occasionally eats soft foods. Pt states Dr Truett Perna took her off BP med. Daughter asking if pt should stop Pravastatin; advised she discuss w/Dr Roselind Messier today. Pt states she has UTI, on Cipro. Pt has evidence of thrush in cheeks in back of mouth and inside her lower lip. Daughter states pt will need new medication for nausea if on Diflucan due to interaction of Zofran w/Diflucan. Daughter was applying Vaseline to rash on pt's buttocks and states rash is better; strongly advised she stop using Vaseline, gave her Biafine. Daughter aware that Biafine needs to be cleansed off skin prior to radiation treatment daily.

## 2012-08-25 NOTE — Progress Notes (Signed)
[facility]    Radiation Oncology [address] [place]     Maryln Gottron, M.D. [place], [place] [address]               Billie Lade, M.D., Ph.D. Phone: [phone]      Molli Hazard A. Kathrynn Running, M.D. Fax: [phone]      Radene Gunning, M.D., Ph.D.         Lurline Hare, M.D.         Grayland Jack, M.D Weekly Treatment Management Note  Name: Rachel Benton     MRN: [id number]        CSN: [id number] Date: 08/25/2012      DOB: August 24, 1940  CC: Elby Showers, MD         Clent Ridges    Status: Outpatient  Diagnosis: The primary encounter diagnosis was Malignant neoplasm of rectum. 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  Current Dose: 19.8 Gy  Current Fraction: 11  Planned Dose: 50.4 Gy  Narrative: Reymundo Poll was seen today for weekly treatment management. The chart was checked and CBCT  were reviewed. She is starting to have improvement in her pain and pressure along the lower back. I did refill the patient's Duragesic patch and oxycodone today. She also is developed a yeast infection in the mouth and has been placed on Diflucan. She also was found to have a urinary tract infection and was placed on Cipro.  She has also been given Compazine for the potential interaction with Diflucan and zofran.  Ace inhibitors; Aspirin; Fish oil; Glipizide; Ibuprofen; Rosiglitazone maleate; and Sulfonamide derivatives  Current Outpatient Prescriptions  Medication Sig Dispense Refill  . Aloe Vera LIQD Apply topically.      . ALPRAZolam (XANAX) 0.25 MG tablet Take 0.25 mg by mouth at bedtime as needed for sleep.      . capecitabine (XELODA) 500 MG tablet Take #3 (1500mg ) po every am & #2 (1000mg ) po every pm = 2500 mg total daily dose Take on days of radiation therapy only M-F  140 tablet  0  . ciprofloxacin (CIPRO) 500 MG tablet Take 1 tablet (500 mg total) by mouth 2 (two) times daily. X 5 days  10  tablet  0  . feeding supplement (PRO-STAT SUGAR FREE 64) LIQD Take 30 mLs by mouth 3 (three) times daily with meals.      . fentaNYL (DURAGESIC - DOSED MCG/HR) 25 MCG/HR Place 1 patch (25 mcg total) onto the skin every 3 (three) days.  5 patch  0  . HYDROcodone-acetaminophen (NORCO/VICODIN) 5-325 MG per tablet Take 1 tablet by mouth every 4 (four) hours as needed for pain.  25 tablet  0  . metFORMIN (GLUCOPHAGE) 1000 MG tablet TAKE ONE TABLET IN THE MORNING, ONE-HALF TABLET AT LUNCH AND ONE TABLET IN THE EVENING (NEED APPOINTMENT)  30 tablet  0  . ondansetron (ZOFRAN) 4 MG tablet Take 1 tablet (4 mg total) by mouth every 8 (eight) hours as needed for nausea.  20 tablet  0  . oxyCODONE-acetaminophen (PERCOCET/ROXICET) 5-325 MG per tablet Take 1 tablet by mouth every 6 (six) hours as needed for pain.  60 tablet  0  . polyethylene glycol (MIRALAX / GLYCOLAX) packet Take 17 g by mouth daily.      . pravastatin (PRAVACHOL) 40 MG tablet Take 40 mg by mouth daily.      . fluconazole (DIFLUCAN) 100 MG tablet Take  1 tablet (100 mg total) by mouth daily.  8 tablet  0  . prochlorperazine (COMPAZINE) 10 MG tablet Take 1 tablet (10 mg total) by mouth every 6 (six) hours as needed.  30 tablet  0   No current facility-administered medications for this encounter.   Labs:  Lab Results  Component Value Date   WBC 4.6 08/18/2012   HGB 11.5* 08/18/2012   HCT 35.8 08/18/2012   MCV 68.5* 08/18/2012   PLT 294 08/18/2012   Lab Results  Component Value Date   CREATININE 0.6 08/18/2012   BUN 14.4 08/18/2012   NA 134* 08/18/2012   K 4.5 08/18/2012   CL 93* 07/08/2012   CO2 31* 08/18/2012   Lab Results  Component Value Date   ALT 9 07/08/2012   AST 11 07/08/2012   PHOS 4.5 08/15/2009   BILITOT 0.3 07/08/2012    Physical Examination:  weight is 145 lb 11.2 oz (66.089 kg). Her oral temperature is 98.1 F (36.7 C). Her blood pressure is 102/62 and her pulse is 55. Her respiration is 20.    Wt Readings from Last 3 Encounters:   08/25/12 145 lb 11.2 oz (66.089 kg)  08/20/12 147 lb (66.679 kg)  08/17/12 145 lb 3.2 oz (65.862 kg)    The oral cavity shows a yeast infection along the buccal mucosa and soft palate region. Lungs - Normal respiratory effort, chest expands symmetrically. Lungs are clear to auscultation, no crackles or wheezes.  Heart has regular rhythm and rate  Abdomen is soft and non tender with normal bowel sounds  Assessment:  Patient tolerating treatments well  Plan: Continue treatment per original radiation prescription

## 2012-08-26 ENCOUNTER — Ambulatory Visit: Payer: Medicare Other | Admitting: Nutrition

## 2012-08-26 ENCOUNTER — Telehealth: Payer: Self-pay | Admitting: *Deleted

## 2012-08-26 ENCOUNTER — Other Ambulatory Visit: Payer: Self-pay | Admitting: *Deleted

## 2012-08-26 ENCOUNTER — Ambulatory Visit
Admission: RE | Admit: 2012-08-26 | Discharge: 2012-08-26 | Disposition: A | Discharge status: Home / Self Care | Payer: Medicare Other | Source: Ambulatory Visit | Attending: Radiation Oncology | Admitting: Radiation Oncology

## 2012-08-26 DIAGNOSIS — C775 Secondary and unspecified malignant neoplasm of intrapelvic lymph nodes: Secondary | ICD-10-CM

## 2012-08-26 DIAGNOSIS — C20 Malignant neoplasm of rectum: Secondary | ICD-10-CM

## 2012-08-26 LAB — URINE CULTURE

## 2012-08-26 NOTE — Progress Notes (Signed)
Pt returns for follow-up.  Wt 146.4# stable x 30 days.  Pt reports she is doing about the same.  Daughter, Babette Relic is present.  Pt states she is eating very little solid food, but continues to consume 4 CIB shakes containing various ingredients/day.  Drinking >64 oz of water/day and taking ~7 Tbsp of Prostat/day.  Pt still having some nausea and problems with thrush in throat, but is eating bites of foods.  Pt report bowels are formed, but c/o urgency to go.    Nutrition Dx: Inadequate oral intake related to Dx of rectal Ca as evidenced by inability to tolerate solid foods.   Intervention:  Pt was encouraged to continue drinking minimum of 4 shakes/day and try soft high kcal/high protein foods as tolerated.    Monitoring evaluating and goals:  Pt will tolerate increased kcal and protein to minimize wt loss and improve tolerance of tx.   Next visit:  To be scheduled.

## 2012-08-26 NOTE — Telephone Encounter (Signed)
Wheelchair order was signed by Dr. Truett Perna on 08/20/12. Order had not been faxed. Called pt's home, spoke with daughter Babette Relic, who stated she doesn't think pt needs wheelchair. Would like to hold off on wheelchair for now. Stated pt only needs wheelchair when she comes to office. Has walker at home that she is reluctant to use.

## 2012-08-27 ENCOUNTER — Ambulatory Visit
Admission: RE | Admit: 2012-08-27 | Discharge: 2012-08-27 | Disposition: A | Discharge status: Home / Self Care | Payer: Medicare Other | Source: Ambulatory Visit | Attending: Radiation Oncology | Admitting: Radiation Oncology

## 2012-08-28 ENCOUNTER — Ambulatory Visit
Admission: RE | Admit: 2012-08-28 | Discharge: 2012-08-28 | Disposition: A | Discharge status: Home / Self Care | Payer: Medicare Other | Source: Ambulatory Visit | Attending: Radiation Oncology | Admitting: Radiation Oncology

## 2012-08-31 ENCOUNTER — Other Ambulatory Visit (HOSPITAL_BASED_OUTPATIENT_CLINIC_OR_DEPARTMENT_OTHER): Payer: Medicare Other | Admitting: Lab

## 2012-08-31 ENCOUNTER — Ambulatory Visit
Admission: RE | Admit: 2012-08-31 | Discharge: 2012-08-31 | Disposition: A | Discharge status: Home / Self Care | Payer: Medicare Other | Source: Ambulatory Visit | Attending: Radiation Oncology | Admitting: Radiation Oncology

## 2012-08-31 DIAGNOSIS — C20 Malignant neoplasm of rectum: Secondary | ICD-10-CM

## 2012-08-31 DIAGNOSIS — C775 Secondary and unspecified malignant neoplasm of intrapelvic lymph nodes: Secondary | ICD-10-CM

## 2012-08-31 LAB — CBC WITH DIFFERENTIAL/PLATELET
Basophils Absolute: 0 10*3/uL (ref 0.0–0.1)
EOS%: 1.3 % (ref 0.0–7.0)
HCT: 33.6 % — ABNORMAL LOW (ref 34.8–46.6)
HGB: 10.5 g/dL — ABNORMAL LOW (ref 11.6–15.9)
LYMPH%: 7 % — ABNORMAL LOW (ref 14.0–49.7)
MCH: 23 pg — ABNORMAL LOW (ref 25.1–34.0)
MCHC: 31.3 g/dL — ABNORMAL LOW (ref 31.5–36.0)
MCV: 73.7 fL — ABNORMAL LOW (ref 79.5–101.0)
NEUT%: 84.2 % — ABNORMAL HIGH (ref 38.4–76.8)
Platelets: 165 10*3/uL (ref 145–400)
lymph#: 0.4 10*3/uL — ABNORMAL LOW (ref 0.9–3.3)

## 2012-09-01 ENCOUNTER — Ambulatory Visit
Admission: RE | Admit: 2012-09-01 | Discharge: 2012-09-01 | Disposition: A | Discharge status: Home / Self Care | Payer: Medicare Other | Source: Ambulatory Visit | Attending: Radiation Oncology | Admitting: Radiation Oncology

## 2012-09-01 VITALS — BP 125/75 | HR 125 | Temp 97.7°F | Resp 20 | Wt 140.3 lb

## 2012-09-01 DIAGNOSIS — C20 Malignant neoplasm of rectum: Secondary | ICD-10-CM

## 2012-09-01 NOTE — Progress Notes (Signed)
[facility]    Radiation Oncology [address] [place]     Maryln Gottron, M.D. [place], [place] [address]               Billie Lade, M.D., Ph.D. Phone: [phone]      Molli Hazard A. Kathrynn Running, M.D. Fax: [phone]      Radene Gunning, M.D., Ph.D.         Lurline Hare, M.D.         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  Current Dose: 28.8 Gy  Current Fraction: 16  Planned Dose: 50.4 Gy  Narrative: Reymundo Poll was seen today for weekly treatment management. The chart was checked and CBCT  were reviewed. She is having a lot of fatigue at this time. According to family members she does sleep a lot. She's also had some problems with urination but is urinating approximately every 4-5 hours. Encouraged patient to take in plenty of water to help dilute your urine and make urination easier.  Overall her pain in the rectum area has improved with her treatments.  Ace inhibitors; Aspirin; Fish oil; Glipizide; Ibuprofen; Rosiglitazone maleate; and Sulfonamide derivatives Current Outpatient Prescriptions  Medication Sig Dispense Refill  . Aloe Vera LIQD Apply topically.      . ALPRAZolam (XANAX) 0.25 MG tablet Take 0.25 mg by mouth at bedtime as needed for sleep.      . capecitabine (XELODA) 500 MG tablet Take #3 (1500mg ) po every am & #2 (1000mg ) po every pm = 2500 mg total daily dose Take on days of radiation therapy only M-F  140 tablet  0  . feeding supplement (PRO-STAT SUGAR FREE 64) LIQD Take 30 mLs by mouth 3 (three) times daily with meals.      . fentaNYL (DURAGESIC - DOSED MCG/HR) 25 MCG/HR Place 1 patch (25 mcg total) onto the skin every 3 (three) days.  5 patch  0  . HYDROcodone-acetaminophen  (NORCO/VICODIN) 5-325 MG per tablet Take 1 tablet by mouth every 4 (four) hours as needed for pain.  25 tablet  0  . metFORMIN (GLUCOPHAGE) 1000 MG tablet TAKE ONE TABLET IN THE MORNING, ONE-HALF TABLET AT LUNCH AND ONE TABLET IN THE EVENING (NEED APPOINTMENT)  30 tablet  0  . ondansetron (ZOFRAN) 4 MG tablet Take 1 tablet (4 mg total) by mouth every 8 (eight) hours as needed for nausea.  20 tablet  0  . oxyCODONE-acetaminophen (PERCOCET/ROXICET) 5-325 MG per tablet Take 1 tablet by mouth every 6 (six) hours as needed for pain.  60 tablet  0  . polyethylene glycol (MIRALAX / GLYCOLAX) packet Take 17 g by mouth daily.      . pravastatin (PRAVACHOL) 40 MG tablet Take 40 mg by mouth daily.      . prochlorperazine (COMPAZINE) 10 MG tablet Take 1 tablet (10 mg total) by mouth every 6 (six) hours as needed.  30 tablet  0   No current facility-administered medications for this encounter.   Labs:  Lab Results  Component Value Date   WBC 6.0 08/31/2012   HGB 10.5* 08/31/2012   HCT 33.6* 08/31/2012  MCV 73.7* 08/31/2012   PLT 165 08/31/2012   Lab Results  Component Value Date   CREATININE 0.6 08/18/2012   BUN 14.4 08/18/2012   NA 134* 08/18/2012   K 4.5 08/18/2012   CL 93* 07/08/2012   CO2 31* 08/18/2012   Lab Results  Component Value Date   ALT 9 07/08/2012   AST 11 07/08/2012   PHOS 4.5 08/15/2009   BILITOT 0.3 07/08/2012    Physical Examination:  weight is 140 lb 4.8 oz (63.64 kg). Her oral temperature is 97.7 F (36.5 C). Her blood pressure is 125/75 and her pulse is 125. Her respiration is 20.    Wt Readings from Last 3 Encounters:  09/01/12 140 lb 4.8 oz (63.64 kg)  08/25/12 145 lb 11.2 oz (66.089 kg)  08/20/12 147 lb (66.679 kg)     Lungs - Normal respiratory effort, chest expands symmetrically. Lungs are clear to auscultation, no crackles or wheezes.  Heart has regular rhythm and rate  Abdomen is soft and non tender with normal bowel sounds She continues to have pitting edema in the ankle  and foot areas.  Assessment:  Patient tolerating treatments well  Plan: Continue treatment per original radiation prescription

## 2012-09-01 NOTE — Progress Notes (Addendum)
Pt c/o pain under her ribs in her back on left side. She states that she fell at home and twisted her back before she was diagnosed w/cancer. She states that her back is still causing her pain. She also has rectal pain. She is wearing Duragesic patch, takes Oxycodone 1/4-1/2 tab , Xanax prn w/good relief. Pt states she feels rectal and bladder pressure even after going to the bathroom. She states she had normal BM 2 days ago, has small amounts BM daily. She denies dysuria. Pt has increasing weakness, loss of appetite, fatigue. Daughter states she feels pt is depressed. At pt's request left vm w/L Mullis SW to call pt at home.

## 2012-09-02 ENCOUNTER — Ambulatory Visit
Admission: RE | Admit: 2012-09-02 | Discharge: 2012-09-02 | Disposition: A | Discharge status: Home / Self Care | Payer: Medicare Other | Source: Ambulatory Visit | Attending: Radiation Oncology | Admitting: Radiation Oncology

## 2012-09-03 ENCOUNTER — Ambulatory Visit
Admission: RE | Admit: 2012-09-03 | Discharge: 2012-09-03 | Disposition: A | Discharge status: Home / Self Care | Payer: Medicare Other | Source: Ambulatory Visit | Attending: Radiation Oncology | Admitting: Radiation Oncology

## 2012-09-04 ENCOUNTER — Ambulatory Visit: Payer: Medicare Other

## 2012-09-04 ENCOUNTER — Ambulatory Visit
Admission: RE | Admit: 2012-09-04 | Discharge: 2012-09-04 | Disposition: A | Discharge status: Home / Self Care | Payer: Medicare Other | Source: Ambulatory Visit | Attending: Radiation Oncology | Admitting: Radiation Oncology

## 2012-09-07 ENCOUNTER — Ambulatory Visit
Admission: RE | Admit: 2012-09-07 | Discharge: 2012-09-07 | Disposition: A | Discharge status: Home / Self Care | Payer: Medicare Other | Source: Ambulatory Visit | Attending: Radiation Oncology | Admitting: Radiation Oncology

## 2012-09-07 ENCOUNTER — Other Ambulatory Visit: Payer: Self-pay | Admitting: *Deleted

## 2012-09-07 DIAGNOSIS — C20 Malignant neoplasm of rectum: Secondary | ICD-10-CM

## 2012-09-07 DIAGNOSIS — C775 Secondary and unspecified malignant neoplasm of intrapelvic lymph nodes: Secondary | ICD-10-CM

## 2012-09-07 NOTE — Telephone Encounter (Signed)
THIS REFILL REQUEST FOR CAPECITABINE WAS GIVEN TO DR.SHERRILL'S NURSE, AMY HORTON,RN. 

## 2012-09-08 ENCOUNTER — Ambulatory Visit
Admission: RE | Admit: 2012-09-08 | Discharge: 2012-09-08 | Disposition: A | Discharge status: Home / Self Care | Payer: Medicare Other | Source: Ambulatory Visit | Attending: Radiation Oncology | Admitting: Radiation Oncology

## 2012-09-08 ENCOUNTER — Encounter: Payer: Self-pay | Admitting: Radiation Oncology

## 2012-09-08 ENCOUNTER — Ambulatory Visit (HOSPITAL_BASED_OUTPATIENT_CLINIC_OR_DEPARTMENT_OTHER): Payer: Medicare Other | Admitting: Nurse Practitioner

## 2012-09-08 ENCOUNTER — Other Ambulatory Visit (HOSPITAL_BASED_OUTPATIENT_CLINIC_OR_DEPARTMENT_OTHER): Payer: Medicare Other | Admitting: Lab

## 2012-09-08 VITALS — BP 132/78 | HR 98 | Temp 97.5°F | Resp 20 | Wt 141.4 lb

## 2012-09-08 VITALS — BP 132/78 | HR 98 | Temp 97.5°F | Resp 18 | Ht <= 58 in | Wt 141.4 lb

## 2012-09-08 DIAGNOSIS — C20 Malignant neoplasm of rectum: Secondary | ICD-10-CM

## 2012-09-08 DIAGNOSIS — C775 Secondary and unspecified malignant neoplasm of intrapelvic lymph nodes: Secondary | ICD-10-CM

## 2012-09-08 LAB — CBC WITH DIFFERENTIAL/PLATELET
BASO%: 0.4 % (ref 0.0–2.0)
Eosinophils Absolute: 0.1 10*3/uL (ref 0.0–0.5)
HCT: 31.1 % — ABNORMAL LOW (ref 34.8–46.6)
LYMPH%: 8.9 % — ABNORMAL LOW (ref 14.0–49.7)
MCHC: 31.5 g/dL (ref 31.5–36.0)
MCV: 75.9 fL — ABNORMAL LOW (ref 79.5–101.0)
MONO#: 0.2 10*3/uL (ref 0.1–0.9)
MONO%: 5.9 % (ref 0.0–14.0)
NEUT%: 83 % — ABNORMAL HIGH (ref 38.4–76.8)
Platelets: 124 10*3/uL — ABNORMAL LOW (ref 145–400)
WBC: 2.7 10*3/uL — ABNORMAL LOW (ref 3.9–10.3)

## 2012-09-08 MED ORDER — CAPECITABINE 500 MG PO TABS: ORAL_TABLET | ORAL | Status: DC

## 2012-09-08 MED ORDER — BIAFINE EX EMUL
Freq: Two times a day (BID) | CUTANEOUS | Status: DC
  Administered 2012-09-08: 15:00:00 via TOPICAL

## 2012-09-08 NOTE — Progress Notes (Deleted)
Pt states she continues to have upper back/neck pain, rates 4 today. She states her pain level is never below 3/10. She only takes Percocet at night due to the side effects. She states she does not want to take any more Tylenol during the day.  Pt states she has right leg pain but "it is from my surgery". She has esophagitis but states Carafate is helpful. Pt has loss of appetite, fatigue.

## 2012-09-08 NOTE — Progress Notes (Signed)
[facility]    Radiation Oncology [address] [place]     Maryln Gottron, M.D. [place], [place] [address]               Billie Lade, M.D., Ph.D. Phone: [phone]      Molli Hazard A. Kathrynn Running, M.D. Fax: [phone]      Radene Gunning, M.D., Ph.D.         Lurline Hare, M.D.         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  Current Dose: 37.8 Gy  Current Fraction: 21  Planned Dose: 50.4 GY  Narrative: Reymundo Poll was seen today for weekly treatment management. The chart was checked and CBCT  were reviewed. Overall her pain has improved with her treatments. She is having a fair minor fatigue at this time. She denies any rectal bleeding or vaginal bleeding.   Ace inhibitors; Aspirin; Fish oil; Glipizide; Ibuprofen; Rosiglitazone maleate; and Sulfonamide derivatives Current Outpatient Prescriptions  Medication Sig Dispense Refill  . Aloe Vera LIQD Apply topically.      . ALPRAZolam (XANAX) 0.25 MG tablet Take 0.25 mg by mouth at bedtime as needed for sleep.      . capecitabine (XELODA) 500 MG tablet Take #3 (1500mg ) po every am & #2 (1000mg ) po every pm = 2500 mg total daily dose Take on days of radiation therapy only M-F  70 tablet  0  . emollient (BIAFINE) cream Apply topically 2 (two) times daily.      . feeding supplement (PRO-STAT SUGAR FREE 64) LIQD Take 30 mLs by mouth 3 (three) times daily with meals.      . fentaNYL (DURAGESIC - DOSED MCG/HR) 25 MCG/HR Place 1 patch (25 mcg total) onto the skin every 3 (three) days.  5 patch  0  . HYDROcodone-acetaminophen (NORCO/VICODIN) 5-325 MG per tablet Take 1 tablet by mouth every 4 (four) hours as needed for pain.  25 tablet  0  . metFORMIN  (GLUCOPHAGE) 1000 MG tablet TAKE ONE TABLET IN THE MORNING, ONE-HALF TABLET AT LUNCH AND ONE TABLET IN THE EVENING (NEED APPOINTMENT)  30 tablet  0  . ondansetron (ZOFRAN) 4 MG tablet Take 1 tablet (4 mg total) by mouth every 8 (eight) hours as needed for nausea.  20 tablet  0  . oxyCODONE-acetaminophen (PERCOCET/ROXICET) 5-325 MG per tablet Take 1 tablet by mouth every 6 (six) hours as needed for pain.  60 tablet  0  . polyethylene glycol (MIRALAX / GLYCOLAX) packet Take 17 g by mouth daily.      . pravastatin (PRAVACHOL) 40 MG tablet Take 40 mg by mouth daily.      . prochlorperazine (COMPAZINE) 10 MG tablet Take 1 tablet (10 mg total) by mouth every 6 (six) hours as needed.  30 tablet  0   Current Facility-Administered Medications  Medication Dose Route Frequency Provider Last Rate Last Dose  . topical emolient (BIAFINE) emulsion   Topical BID Billie Lade, MD       Labs:  Lab Results  Component Value Date   WBC 2.7* 09/08/2012   HGB 9.8* 09/08/2012  HCT 31.1* 09/08/2012   MCV 75.9* 09/08/2012   PLT 124* 09/08/2012   Lab Results  Component Value Date   CREATININE 0.6 08/18/2012   BUN 14.4 08/18/2012   NA 134* 08/18/2012   K 4.5 08/18/2012   CL 93* 07/08/2012   CO2 31* 08/18/2012   Lab Results  Component Value Date   ALT 9 07/08/2012   AST 11 07/08/2012   PHOS 4.5 08/15/2009   BILITOT 0.3 07/08/2012    Physical Examination:  weight is 141 lb 6.4 oz (64.139 kg). Her oral temperature is 97.5 F (36.4 C). Her blood pressure is 132/78 and her pulse is 98. Her respiration is 20.    Wt Readings from Last 3 Encounters:  09/08/12 141 lb 6.4 oz (64.139 kg)  09/08/12 141 lb 6.4 oz (64.139 kg)  09/01/12 140 lb 4.8 oz (63.64 kg)     Lungs - Normal respiratory effort, chest expands symmetrically. Lungs are clear to auscultation, no crackles or wheezes.  Heart has regular rhythm and rate  Abdomen is soft and non tender with normal bowel sounds The perineum shows some erythema but no skin  breakdown is appreciated. The perirectal area shows erythema but no moist desquamation.  Assessment:  Patient tolerating treatments well  Plan: Continue treatment per original radiation prescription

## 2012-09-08 NOTE — Addendum Note (Signed)
Addended by: Arvilla Meres on: 09/08/2012 10:17 AM   Modules accepted: Orders

## 2012-09-08 NOTE — Progress Notes (Signed)
Pt denies pain, states she "feels pretty good today". Pt's daughter states pt's BM's are normal although pt states she is tending to be constipated. She is having BM daily. Pt reports occasionally having to strain to urinate, denies dysuria but states she "sometimes itches when she urinates". She denies discharge. Pt has loss of appetite, fatigue. Her lower legs, feet are edematous today, left more than right. Daughter states pt's feet were "down to normal on Saturday, but pt became stressed and they swelled back up". Daughter continues to be concerned that pt is depressed, and pt states she would like to try something for depression. Will discuss w/Dr Roselind Messier today. Pt also c/o "pricking sensation" on sides of her feet. Daughter states pt "still has rash on her buttocks, and it has spread to her groin in front". Daughter states Biafine cream was very helpful on buttocks; gave her another tube today.

## 2012-09-08 NOTE — Progress Notes (Signed)
OFFICE PROGRESS NOTE  Interval history:  Ms. Pointer returns as scheduled. She continues radiation and Xeloda. No recent nausea/vomiting. No mouth sores. She has had a few episodes of diarrhea. No hand pain or redness. She notes discomfort along the sides of the feet. The rectal pain she presented with is significantly better. The pain she is experiencing at present she relates to the radiation.   Objective: Blood pressure 132/78, pulse 98, temperature 97.5 F (36.4 C), temperature source Oral, resp. rate 18, height 4\' 9"  (1.448 m), weight 141 lb 6.4 oz (64.139 kg).  Oropharynx is without thrush or ulceration. Lungs are clear. Regular cardiac rhythm with occasional premature beat. Abdomen is soft and nontender. No hepatomegaly. Skin is erythematous over the pubic and groin regions. The skin also appears erythematous at the perineum extending onto the buttocks but is difficult to assess due to the presence of lotion. Soles are mildly erythematous with dry skin at the heels. No skin breakdown. Palms are without erythema.  Lab Results: Lab Results  Component Value Date   WBC 2.7* 09/08/2012   HGB 9.8* 09/08/2012   HCT 31.1* 09/08/2012   MCV 75.9* 09/08/2012   PLT 124* 09/08/2012    Chemistry:    Chemistry      Component Value Date/Time   NA 134* 08/18/2012 1324   NA 132* 07/08/2012 1939   K 4.5 08/18/2012 1324   K 3.8 07/08/2012 1939   CL 93* 07/08/2012 1939   CO2 31* 08/18/2012 1324   CO2 27 07/08/2012 1939   BUN 14.4 08/18/2012 1324   BUN 14 07/08/2012 1939   CREATININE 0.6 08/18/2012 1324   CREATININE 0.48* 07/08/2012 1939      Component Value Date/Time   CALCIUM 8.9 08/18/2012 1324   CALCIUM 9.1 07/08/2012 1939   ALKPHOS 108 07/08/2012 1939   AST 11 07/08/2012 1939   ALT 9 07/08/2012 1939   BILITOT 0.3 07/08/2012 1939       Studies/Results: No results found.  Medications: I have reviewed the patient's current medications.  Assessment/Plan:  1. Locally advanced (clinically lymph node  positive) rectal cancer. CT on 07/08/2012 consistent with a rectal mass and perirectal sigmoid mesocolon lymphadenopathy. No evidence of distant metastatic disease.   Radiation and concurrent Xeloda chemotherapy initiated 08/05/2012. 2. Pain secondary to #1. Improved. 3. Diabetes. 4. Hypertension. 5. History of a CVA. 6. Microcytic anemia. Ferritin 93 on 08/31/2012. 7. Skin toxicity related to radiation and Xeloda. She will discuss with radiation oncology at her visit later today. 8. Question early hand-foot syndrome related to Xeloda. She understands to discontinue Xeloda and contact the office if the foot symptoms worsen.  Disposition-she continues radiation and Xeloda. She will complete the course of radiation on 09/17/2012. She understands to discontinue Xeloda coinciding with the completion of radiation. She will return for a followup visit on 10/01/2012. She will contact the office in the interim as outlined above or with any other problems.  Plan reviewed with Dr. Truett Perna.   Lonna Cobb ANP/GNP-BC

## 2012-09-09 ENCOUNTER — Ambulatory Visit
Admission: RE | Admit: 2012-09-09 | Discharge: 2012-09-09 | Disposition: A | Discharge status: Home / Self Care | Payer: Medicare Other | Source: Ambulatory Visit | Attending: Radiation Oncology | Admitting: Radiation Oncology

## 2012-09-09 ENCOUNTER — Telehealth: Payer: Self-pay | Admitting: *Deleted

## 2012-09-09 ENCOUNTER — Telehealth: Payer: Self-pay | Admitting: Oncology

## 2012-09-09 NOTE — Telephone Encounter (Signed)
lvm for pt regarding to appts... °

## 2012-09-09 NOTE — Telephone Encounter (Signed)
Received vm from pt's daughter. Returned call, and daughter states she thinks pt "scratched herself between her buttocks". She states it is a very small area. Advised she apply antibiotic ointment, and inform this nurse if area gets larger or worse in appearance. Daughter verbalized understanding.

## 2012-09-09 NOTE — Telephone Encounter (Signed)
CALLED PATIENT TO INFORM OF APPT. WITH NUTRITIONIST  ON 09-17-12, SPOKE WITH [name] TAMMY AND SHE IS AWARE OF THIS APPT.

## 2012-09-10 ENCOUNTER — Encounter: Payer: Self-pay | Admitting: Radiation Oncology

## 2012-09-10 ENCOUNTER — Ambulatory Visit: Payer: Medicare Other

## 2012-09-10 ENCOUNTER — Ambulatory Visit
Admission: RE | Admit: 2012-09-10 | Discharge: 2012-09-10 | Disposition: A | Discharge status: Home / Self Care | Payer: Medicare Other | Source: Ambulatory Visit | Attending: Radiation Oncology | Admitting: Radiation Oncology

## 2012-09-10 NOTE — Progress Notes (Signed)
   Department of Radiation Oncology  Phone:  [phone] Fax:        [phone]  Simulation note  Today the patient underwent additional planning for radiation therapy directed at the pelvis area. The patient's treatment planning CT scan was reviewed and she had set up of a 3 field boost field directed at the tumor within the pelvis region. A computerized isodose plan will be generated for treatment area.  -----------------------------------  Billie Lade, PhD, MD

## 2012-09-11 ENCOUNTER — Ambulatory Visit
Admission: RE | Admit: 2012-09-11 | Discharge: 2012-09-11 | Disposition: A | Discharge status: Home / Self Care | Payer: Medicare Other | Source: Ambulatory Visit | Attending: Radiation Oncology | Admitting: Radiation Oncology

## 2012-09-11 ENCOUNTER — Telehealth: Payer: Self-pay | Admitting: *Deleted

## 2012-09-11 ENCOUNTER — Ambulatory Visit: Payer: Medicare Other

## 2012-09-11 DIAGNOSIS — C775 Secondary and unspecified malignant neoplasm of intrapelvic lymph nodes: Secondary | ICD-10-CM

## 2012-09-11 NOTE — Telephone Encounter (Signed)
Message copied by Gala Romney on Fri Sep 11, 2012  3:22 PM ------      Message from: Thornton Papas B      Created: Tue Sep 08, 2012  9:53 PM       Start ferrous sulfate 325mg  bid, likely has iron deficiency ------

## 2012-09-11 NOTE — Telephone Encounter (Signed)
Pt's daughter notified that pt needs to start taking ferrous sulfate 325mg  twice a day.  Pt's daughter verbalized understanding.

## 2012-09-12 ENCOUNTER — Ambulatory Visit: Payer: Medicare Other

## 2012-09-14 ENCOUNTER — Ambulatory Visit: Payer: Medicare Other

## 2012-09-14 ENCOUNTER — Ambulatory Visit
Admission: RE | Admit: 2012-09-14 | Discharge: 2012-09-14 | Disposition: A | Discharge status: Home / Self Care | Payer: Medicare Other | Source: Ambulatory Visit | Attending: Radiation Oncology | Admitting: Radiation Oncology

## 2012-09-15 ENCOUNTER — Ambulatory Visit: Payer: Medicare Other

## 2012-09-15 ENCOUNTER — Ambulatory Visit (HOSPITAL_COMMUNITY): Admit: 2012-09-15 | Payer: Self-pay | Admitting: Gastroenterology

## 2012-09-15 ENCOUNTER — Encounter: Payer: Self-pay | Admitting: Radiation Oncology

## 2012-09-15 ENCOUNTER — Ambulatory Visit
Admission: RE | Admit: 2012-09-15 | Discharge: 2012-09-15 | Disposition: A | Discharge status: Home / Self Care | Payer: Medicare Other | Source: Ambulatory Visit | Attending: Radiation Oncology | Admitting: Radiation Oncology

## 2012-09-15 ENCOUNTER — Encounter (HOSPITAL_COMMUNITY): Payer: Self-pay

## 2012-09-15 VITALS — BP 137/80 | HR 126 | Temp 98.6°F | Resp 20 | Wt 139.6 lb

## 2012-09-15 DIAGNOSIS — C20 Malignant neoplasm of rectum: Secondary | ICD-10-CM

## 2012-09-15 SURGERY — COLONOSCOPY WITH PROPOFOL
Anesthesia: Monitor Anesthesia Care

## 2012-09-15 MED ORDER — FLUCONAZOLE 100 MG PO TABS: 100.0000 mg | ORAL_TABLET | Freq: Every day | ORAL | Status: DC

## 2012-09-15 MED ORDER — ALPRAZOLAM 0.25 MG PO TABS: 0.2500 mg | ORAL_TABLET | Freq: Every evening | ORAL | Status: DC | PRN

## 2012-09-15 NOTE — Progress Notes (Signed)
Pt c/o lower back pain which she states is from "getting on and off the treatment table, in and out of my truck to come here." She was given Oxycodone 3 days ago by family member, and daughter states "it gives her all kinds of problems". Pt has evidence of thrush on her tongue and tonsil on right side. Pt began to c/o pain in her mouth this morning, has hx of thrush in past.  Pt not eating/drinking well, states food feels like it gets too big in her mouth. She is eating soft foods, does not like nutritional supplements. Pt has FU appointment w/nutritionist 09/17/12.  Pt c/o internal and external hemorrhoid pain; daughter applying a rectal cream. She is applying Biafine to rectal area for skin care, states area is not red but dark, no breakdown. Pt denies diarrhea, has BM daily. Pt voids approximately 6 x daily, " a large amount two times" per daughter. Pt denies dysuria.

## 2012-09-15 NOTE — Progress Notes (Signed)
  Radiation Oncology         [phone] ________________________________  Name: Rachel Benton MRN: [id number]  Date: 09/15/2012  DOB: [birth year]  Simulation Verification Note  Status: outpatient  NARRATIVE: The patient was brought to the treatment unit and placed in the planned treatment position. The clinical setup was verified. Then port films were obtained and uploaded to the radiation oncology medical record software.  The treatment beams were carefully compared against the planned radiation fields. The position location and shape of the radiation fields was reviewed. They targeted volume of tissue appears to be appropriately covered by the radiation beams. Organs at risk appear to be excluded as planned.  Based on my personal review, I approved the simulation verification. The patient's treatment will proceed as planned.  -----------------------------------  Billie Lade, PhD, MD

## 2012-09-15 NOTE — Progress Notes (Signed)
[facility]    Radiation Oncology [address] [place]     Maryln Gottron, M.D. [place], [place] [address]               Billie Lade, M.D., Ph.D. Phone: [phone]      Molli Hazard A. Kathrynn Running, M.D. Fax: [phone]      Radene Gunning, M.D., Ph.D.         Lurline Hare, M.D.         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  Current Dose: 46.8 Gy  Current Fraction: 26  Planned Dose: 50.4 Gy  Narrative: Reymundo Poll was seen today for weekly treatment management. The chart was checked and CBCT  were reviewed. She is having fatigue at this time. She is also noticed poor appetite which he has had for some time. In addition food is tasting poorly. Her pain overall is better  Ace inhibitors; Aspirin; Fish oil; Glipizide; Ibuprofen; Rosiglitazone maleate; and Sulfonamide derivatives Current Outpatient Prescriptions  Medication Sig Dispense Refill  . Aloe Vera LIQD Apply topically.      . ALPRAZolam (XANAX) 0.25 MG tablet Take 1 tablet (0.25 mg total) by mouth at bedtime as needed for sleep.  30 tablet  0  . capecitabine (XELODA) 500 MG tablet Take #3 (1500mg ) po every am & #2 (1000mg ) po every pm = 2500 mg total daily dose Take on days of radiation therapy only M-F  70 tablet  0  . emollient (BIAFINE) cream Apply topically 2 (two) times daily.      . feeding supplement (PRO-STAT SUGAR FREE 64) LIQD Take 30 mLs by mouth 3 (three) times daily with meals.      . fentaNYL (DURAGESIC - DOSED MCG/HR) 25 MCG/HR Place 1 patch (25 mcg total) onto the skin every 3 (three) days.  5 patch  0  . ferrous sulfate 325 (65 FE) MG tablet Take 325 mg by mouth daily with breakfast.      . HYDROcodone-acetaminophen  (NORCO/VICODIN) 5-325 MG per tablet Take 1 tablet by mouth every 4 (four) hours as needed for pain.  25 tablet  0  . metFORMIN (GLUCOPHAGE) 1000 MG tablet TAKE ONE TABLET IN THE MORNING, ONE-HALF TABLET AT LUNCH AND ONE TABLET IN THE EVENING (NEED APPOINTMENT)  30 tablet  0  . ondansetron (ZOFRAN) 4 MG tablet Take 1 tablet (4 mg total) by mouth every 8 (eight) hours as needed for nausea.  20 tablet  0  . oxyCODONE-acetaminophen (PERCOCET/ROXICET) 5-325 MG per tablet Take 1 tablet by mouth every 6 (six) hours as needed for pain.  60 tablet  0  . polyethylene glycol (MIRALAX / GLYCOLAX) packet Take 17 g by mouth daily.      . pravastatin (PRAVACHOL) 40 MG tablet Take 40 mg by mouth daily.      . prochlorperazine (COMPAZINE) 10 MG tablet Take 1 tablet (10 mg total) by mouth every 6 (six) hours as needed.  30 tablet  0  . fluconazole (DIFLUCAN) 100 MG tablet Take 1 tablet (100 mg total) by mouth daily.  8 tablet  0   No current facility-administered  medications for this encounter.   Labs:  Lab Results  Component Value Date   WBC 2.7* 09/08/2012   HGB 9.8* 09/08/2012   HCT 31.1* 09/08/2012   MCV 75.9* 09/08/2012   PLT 124* 09/08/2012   Lab Results  Component Value Date   CREATININE 0.6 08/18/2012   BUN 14.4 08/18/2012   NA 134* 08/18/2012   K 4.5 08/18/2012   CL 93* 07/08/2012   CO2 31* 08/18/2012   Lab Results  Component Value Date   ALT 9 07/08/2012   AST 11 07/08/2012   PHOS 4.5 08/15/2009   BILITOT 0.3 07/08/2012    Physical Examination:  weight is 139 lb 9.6 oz (63.322 kg). Her oral temperature is 98.6 F (37 C). Her blood pressure is 137/80 and her pulse is 126. Her respiration is 20.    Wt Readings from Last 3 Encounters:  09/15/12 139 lb 9.6 oz (63.322 kg)  09/08/12 141 lb 6.4 oz (64.139 kg)  09/08/12 141 lb 6.4 oz (64.139 kg)    The oral cavity shows a Candida infection along the dorsum of the tongue and some along the soft palate Lungs - Normal respiratory effort, chest expands  symmetrically. Lungs are clear to auscultation, no crackles or wheezes.  Heart has regular rhythm and rate  Abdomen is soft and non tender with normal bowel sounds  Assessment:  Patient tolerating treatments well except for issues as above  Plan: Continue treatment per original radiation prescription.  The patient had a refill on her Xanax today. I've also given her Diflucan for her Candida infection

## 2012-09-16 ENCOUNTER — Telehealth: Payer: Self-pay | Admitting: *Deleted

## 2012-09-16 ENCOUNTER — Ambulatory Visit
Admission: RE | Admit: 2012-09-16 | Discharge: 2012-09-16 | Disposition: A | Discharge status: Home / Self Care | Payer: Medicare Other | Source: Ambulatory Visit | Attending: Radiation Oncology | Admitting: Radiation Oncology

## 2012-09-16 ENCOUNTER — Ambulatory Visit: Payer: Medicare Other

## 2012-09-16 NOTE — Telephone Encounter (Addendum)
Per Dr Roselind Messier, called [facility]luc[facility], [facility], [phone]. Will call [facility], [place], [place] and cancel script sent yesterday per Dr Roselind Messier. 9:10 am spoke w/Katie at [facility] re: cancel this prescription. Katie states [facility] is unable to fill this prescription. Will inform pt med available at [facility] [address]

## 2012-09-16 NOTE — Telephone Encounter (Signed)
Spoke w/Tammy, pt's daughter and informed her Diflucan called to [facility] [address]. Tammy verbalized understanding. Per daughter, have removed [facility] from pharmacy preference list.

## 2012-09-17 ENCOUNTER — Other Ambulatory Visit: Payer: Self-pay | Admitting: Radiation Oncology

## 2012-09-17 ENCOUNTER — Ambulatory Visit
Admission: RE | Admit: 2012-09-17 | Discharge: 2012-09-17 | Disposition: A | Discharge status: Home / Self Care | Payer: Medicare Other | Source: Ambulatory Visit | Attending: Radiation Oncology | Admitting: Radiation Oncology

## 2012-09-17 ENCOUNTER — Ambulatory Visit: Payer: Medicare Other | Admitting: Nutrition

## 2012-09-17 ENCOUNTER — Other Ambulatory Visit (HOSPITAL_BASED_OUTPATIENT_CLINIC_OR_DEPARTMENT_OTHER): Payer: Medicare Other | Admitting: Lab

## 2012-09-17 ENCOUNTER — Other Ambulatory Visit: Payer: Self-pay | Admitting: Nurse Practitioner

## 2012-09-17 ENCOUNTER — Encounter: Payer: Self-pay | Admitting: Nutrition

## 2012-09-17 DIAGNOSIS — C775 Secondary and unspecified malignant neoplasm of intrapelvic lymph nodes: Secondary | ICD-10-CM

## 2012-09-17 DIAGNOSIS — C20 Malignant neoplasm of rectum: Secondary | ICD-10-CM

## 2012-09-17 LAB — CBC WITH DIFFERENTIAL/PLATELET
Eosinophils Absolute: 0.1 10*3/uL (ref 0.0–0.5)
HCT: 31.8 % — ABNORMAL LOW (ref 34.8–46.6)
LYMPH%: 8.6 % — ABNORMAL LOW (ref 14.0–49.7)
MONO#: 0.2 10*3/uL (ref 0.1–0.9)
NEUT#: 3.3 10*3/uL (ref 1.5–6.5)
NEUT%: 84.3 % — ABNORMAL HIGH (ref 38.4–76.8)
Platelets: 104 10*3/uL — ABNORMAL LOW (ref 145–400)
WBC: 4 10*3/uL (ref 3.9–10.3)

## 2012-09-17 MED ORDER — FENTANYL 25 MCG/HR TD PT72: 1.0000 | MEDICATED_PATCH | TRANSDERMAL | Status: DC

## 2012-09-17 NOTE — Progress Notes (Signed)
Patient did not show up for scheduled nutrition appointment. 

## 2012-09-17 NOTE — Progress Notes (Signed)
Patient arrived late for nutrition appointment.  She was held up in radiation oncology.  She did complete her radiation therapy today.  Her daughter says she is tired drinking, but still is consuming protein shakes 3-4 times a day.  They are adding a whey protein powder providing 115 calories and 21 g of protein per package once or twice daily.  Weight has decreased to 139.6 pounds documented July 29 from 145.7 pounds on July 8.  Nutrition diagnosis: Inadequate oral intake continues.  Intervention: I have encouraged patient and daughter to increase protein shakes to 4-5 times daily and continue to try soft, high-calorie, high-protein foods as tolerated.  Teach back method used.  Monitoring, evaluation, and goals: Patient will tolerate increased calories and protein to minimize further weight loss.  Next visit: Patient or family will contact me with further questions or concerns.

## 2012-09-21 ENCOUNTER — Encounter (HOSPITAL_COMMUNITY): Payer: Self-pay | Admitting: Emergency Medicine

## 2012-09-21 ENCOUNTER — Inpatient Hospital Stay (HOSPITAL_COMMUNITY)
Admission: EM | Admit: 2012-09-21 | Discharge: 2012-10-01 | DRG: 064 | Disposition: A | Discharge status: Skilled Nursing Facility | Payer: Medicare Other | Attending: Internal Medicine | Admitting: Internal Medicine

## 2012-09-21 ENCOUNTER — Telehealth: Payer: Self-pay | Admitting: *Deleted

## 2012-09-21 ENCOUNTER — Emergency Department (HOSPITAL_COMMUNITY): Payer: Medicare Other

## 2012-09-21 ENCOUNTER — Inpatient Hospital Stay (HOSPITAL_COMMUNITY): Payer: Medicare Other

## 2012-09-21 DIAGNOSIS — C801 Malignant (primary) neoplasm, unspecified: Secondary | ICD-10-CM

## 2012-09-21 DIAGNOSIS — Z8679 Personal history of other diseases of the circulatory system: Secondary | ICD-10-CM

## 2012-09-21 DIAGNOSIS — K649 Unspecified hemorrhoids: Secondary | ICD-10-CM | POA: Diagnosis present

## 2012-09-21 DIAGNOSIS — D6959 Other secondary thrombocytopenia: Secondary | ICD-10-CM | POA: Diagnosis present

## 2012-09-21 DIAGNOSIS — T451X5A Adverse effect of antineoplastic and immunosuppressive drugs, initial encounter: Secondary | ICD-10-CM | POA: Diagnosis present

## 2012-09-21 DIAGNOSIS — I639 Cerebral infarction, unspecified: Secondary | ICD-10-CM

## 2012-09-21 DIAGNOSIS — E871 Hypo-osmolality and hyponatremia: Secondary | ICD-10-CM | POA: Diagnosis present

## 2012-09-21 DIAGNOSIS — R918 Other nonspecific abnormal finding of lung field: Secondary | ICD-10-CM | POA: Diagnosis not present

## 2012-09-21 DIAGNOSIS — E119 Type 2 diabetes mellitus without complications: Secondary | ICD-10-CM | POA: Diagnosis present

## 2012-09-21 DIAGNOSIS — C20 Malignant neoplasm of rectum: Secondary | ICD-10-CM

## 2012-09-21 DIAGNOSIS — D500 Iron deficiency anemia secondary to blood loss (chronic): Secondary | ICD-10-CM | POA: Diagnosis present

## 2012-09-21 DIAGNOSIS — I6350 Cerebral infarction due to unspecified occlusion or stenosis of unspecified cerebral artery: Principal | ICD-10-CM

## 2012-09-21 DIAGNOSIS — G8190 Hemiplegia, unspecified affecting unspecified side: Secondary | ICD-10-CM | POA: Diagnosis present

## 2012-09-21 DIAGNOSIS — Z79899 Other long term (current) drug therapy: Secondary | ICD-10-CM

## 2012-09-21 DIAGNOSIS — R1310 Dysphagia, unspecified: Secondary | ICD-10-CM | POA: Diagnosis present

## 2012-09-21 DIAGNOSIS — E43 Unspecified severe protein-calorie malnutrition: Secondary | ICD-10-CM | POA: Diagnosis present

## 2012-09-21 DIAGNOSIS — F411 Generalized anxiety disorder: Secondary | ICD-10-CM | POA: Diagnosis present

## 2012-09-21 DIAGNOSIS — G936 Cerebral edema: Secondary | ICD-10-CM | POA: Diagnosis present

## 2012-09-21 DIAGNOSIS — M549 Dorsalgia, unspecified: Secondary | ICD-10-CM | POA: Diagnosis present

## 2012-09-21 DIAGNOSIS — Z9181 History of falling: Secondary | ICD-10-CM

## 2012-09-21 DIAGNOSIS — N390 Urinary tract infection, site not specified: Secondary | ICD-10-CM | POA: Diagnosis present

## 2012-09-21 DIAGNOSIS — D6481 Anemia due to antineoplastic chemotherapy: Secondary | ICD-10-CM | POA: Diagnosis present

## 2012-09-21 DIAGNOSIS — E785 Hyperlipidemia, unspecified: Secondary | ICD-10-CM | POA: Diagnosis present

## 2012-09-21 DIAGNOSIS — R063 Periodic breathing: Secondary | ICD-10-CM | POA: Diagnosis present

## 2012-09-21 DIAGNOSIS — G4700 Insomnia, unspecified: Secondary | ICD-10-CM | POA: Diagnosis present

## 2012-09-21 DIAGNOSIS — C775 Secondary and unspecified malignant neoplasm of intrapelvic lymph nodes: Secondary | ICD-10-CM | POA: Diagnosis present

## 2012-09-21 DIAGNOSIS — R29810 Facial weakness: Secondary | ICD-10-CM | POA: Diagnosis present

## 2012-09-21 DIAGNOSIS — R5381 Other malaise: Secondary | ICD-10-CM | POA: Diagnosis present

## 2012-09-21 DIAGNOSIS — E876 Hypokalemia: Secondary | ICD-10-CM | POA: Diagnosis present

## 2012-09-21 DIAGNOSIS — I10 Essential (primary) hypertension: Secondary | ICD-10-CM | POA: Diagnosis present

## 2012-09-21 DIAGNOSIS — Z96649 Presence of unspecified artificial hip joint: Secondary | ICD-10-CM

## 2012-09-21 DIAGNOSIS — Z923 Personal history of irradiation: Secondary | ICD-10-CM

## 2012-09-21 DIAGNOSIS — Z9221 Personal history of antineoplastic chemotherapy: Secondary | ICD-10-CM

## 2012-09-21 DIAGNOSIS — G8929 Other chronic pain: Secondary | ICD-10-CM | POA: Diagnosis present

## 2012-09-21 LAB — URINALYSIS, ROUTINE W REFLEX MICROSCOPIC
Glucose, UA: 250 mg/dL — AB
Hgb urine dipstick: NEGATIVE
Ketones, ur: 15 mg/dL — AB
Nitrite: NEGATIVE
Protein, ur: 30 mg/dL — AB
Specific Gravity, Urine: 1.019 (ref 1.005–1.030)
Urobilinogen, UA: 1 mg/dL (ref 0.0–1.0)
pH: 5.5 (ref 5.0–8.0)

## 2012-09-21 LAB — POCT I-STAT, CHEM 8
BUN: 6 mg/dL (ref 6–23)
Calcium, Ion: 1.06 mmol/L — ABNORMAL LOW (ref 1.13–1.30)
Chloride: 93 mEq/L — ABNORMAL LOW (ref 96–112)
Creatinine, Ser: 0.6 mg/dL (ref 0.50–1.10)
Glucose, Bld: 256 mg/dL — ABNORMAL HIGH (ref 70–99)
HCT: 29 % — ABNORMAL LOW (ref 36.0–46.0)
Hemoglobin: 9.9 g/dL — ABNORMAL LOW (ref 12.0–15.0)
Potassium: 3.4 meq/L — ABNORMAL LOW (ref 3.5–5.1)
Sodium: 131 mEq/L — ABNORMAL LOW (ref 135–145)
TCO2: 26 mmol/L (ref 0–100)

## 2012-09-21 LAB — CREATININE, URINE, RANDOM: Creatinine, Urine: 119.84 mg/dL

## 2012-09-21 LAB — COMPREHENSIVE METABOLIC PANEL WITH GFR
ALT: 8 U/L (ref 0–35)
AST: 14 U/L (ref 0–37)
CO2: 25 meq/L (ref 19–32)
Chloride: 92 meq/L — ABNORMAL LOW (ref 96–112)
GFR calc non Af Amer: >90 mL/min (ref >90)
Potassium: 3.4 meq/L — ABNORMAL LOW (ref 3.5–5.1)
Sodium: 130 meq/L — ABNORMAL LOW (ref 135–145)
Total Bilirubin: 0.8 mg/dL (ref 0.3–1.2)

## 2012-09-21 LAB — DIFFERENTIAL
Basophils Absolute: 0 10*3/uL (ref 0.0–0.1)
Basophils Relative: 1 % (ref 0–1)
Eosinophils Absolute: 0 10*3/uL (ref 0.0–0.7)
Eosinophils Relative: 1 % (ref 0–5)
Lymphocytes Relative: 9 % — ABNORMAL LOW (ref 12–46)
Lymphs Abs: 0.4 10*3/uL — ABNORMAL LOW (ref 0.7–4.0)
Monocytes Absolute: 0.3 10*3/uL (ref 0.1–1.0)
Monocytes Relative: 7 % (ref 3–12)
Neutro Abs: 3.3 10*3/uL (ref 1.7–7.7)
Neutrophils Relative %: 82 % — ABNORMAL HIGH (ref 43–77)

## 2012-09-21 LAB — CBC
HCT: 29.9 % — ABNORMAL LOW (ref 36.0–46.0)
Hemoglobin: 9.8 g/dL — ABNORMAL LOW (ref 12.0–15.0)
MCH: 26.4 pg (ref 26.0–34.0)
MCHC: 32.8 g/dL (ref 30.0–36.0)
MCV: 80.6 fL (ref 78.0–100.0)
Platelets: 95 10*3/uL — ABNORMAL LOW (ref 150–400)
RBC: 3.71 MIL/uL — ABNORMAL LOW (ref 3.87–5.11)
RDW: 31 % — ABNORMAL HIGH (ref 11.5–15.5)
WBC: 4 10*3/uL (ref 4.0–10.5)

## 2012-09-21 LAB — RAPID URINE DRUG SCREEN, HOSP PERFORMED
Amphetamines: NOT DETECTED
Barbiturates: NOT DETECTED
Benzodiazepines: POSITIVE — AB
Cocaine: NOT DETECTED
Opiates: NOT DETECTED
Tetrahydrocannabinol: NOT DETECTED

## 2012-09-21 LAB — GLUCOSE, CAPILLARY: Glucose-Capillary: 244 mg/dL — ABNORMAL HIGH (ref 70–99)

## 2012-09-21 LAB — PROTIME-INR
INR: 1.34 (ref 0.00–1.49)
INR: 1.28 (ref 0.00–1.49)
Prothrombin Time: 16.3 seconds — ABNORMAL HIGH (ref 11.6–15.2)
Prothrombin Time: 15.7 seconds — ABNORMAL HIGH (ref 11.6–15.2)

## 2012-09-21 LAB — APTT: aPTT: 29 s (ref 24–37)

## 2012-09-21 LAB — COMPREHENSIVE METABOLIC PANEL
Albumin: 2.2 g/dL — ABNORMAL LOW (ref 3.5–5.2)
Alkaline Phosphatase: 230 U/L — ABNORMAL HIGH (ref 39–117)
BUN: 8 mg/dL (ref 6–23)
Calcium: 8.3 mg/dL — ABNORMAL LOW (ref 8.4–10.5)
Creatinine, Ser: 0.49 mg/dL — ABNORMAL LOW (ref 0.50–1.10)
GFR calc Af Amer: >90 mL/min (ref >90)
Glucose, Bld: 263 mg/dL — ABNORMAL HIGH (ref 70–99)
Total Protein: 5.5 g/dL — ABNORMAL LOW (ref 6.0–8.3)

## 2012-09-21 LAB — URINE MICROSCOPIC-ADD ON

## 2012-09-21 LAB — TROPONIN I: Troponin I: <0.3 ng/mL (ref <0.30)

## 2012-09-21 LAB — ETHANOL: Alcohol, Ethyl (B): <11 mg/dL (ref 0–11)

## 2012-09-21 LAB — POCT I-STAT TROPONIN I: Troponin i, poc: 0.01 ng/mL (ref 0.00–0.08)

## 2012-09-21 MED ORDER — ALPRAZOLAM 0.25 MG PO TABS: 0.2500 mg | ORAL_TABLET | Freq: Three times a day (TID) | ORAL | Status: DC | PRN

## 2012-09-21 MED ORDER — HYDRALAZINE HCL 20 MG/ML IJ SOLN: 10.0000 mg | Freq: Four times a day (QID) | INTRAMUSCULAR | Status: DC | PRN

## 2012-09-21 MED ORDER — SIMVASTATIN 40 MG PO TABS
40.0000 mg | ORAL_TABLET | Freq: Every day | ORAL | Status: DC
  Filled 2012-09-21 (×3): qty 1

## 2012-09-21 MED ORDER — POTASSIUM CHLORIDE IN NACL 40-0.9 MEQ/L-% IV SOLN
INTRAVENOUS | Status: DC
  Administered 2012-09-21 – 2012-09-22 (×2): via INTRAVENOUS
  Filled 2012-09-21 (×3): qty 1000

## 2012-09-21 MED ORDER — INSULIN ASPART 100 UNIT/ML ~~LOC~~ SOLN
0.0000 [IU] | SUBCUTANEOUS | Status: DC
  Administered 2012-09-21 – 2012-09-22 (×5): 2 [IU] via SUBCUTANEOUS
  Administered 2012-09-22: 1 [IU] via SUBCUTANEOUS
  Administered 2012-09-22: 2 [IU] via SUBCUTANEOUS
  Administered 2012-09-23 (×6): 1 [IU] via SUBCUTANEOUS
  Administered 2012-09-24: 3 [IU] via SUBCUTANEOUS
  Administered 2012-09-24: 2 [IU] via SUBCUTANEOUS
  Administered 2012-09-24 (×2): 1 [IU] via SUBCUTANEOUS
  Administered 2012-09-24: 2 [IU] via SUBCUTANEOUS
  Administered 2012-09-24: 3 [IU] via SUBCUTANEOUS
  Administered 2012-09-25: 5 [IU] via SUBCUTANEOUS
  Administered 2012-09-25: 7 [IU] via SUBCUTANEOUS
  Administered 2012-09-25: 5 [IU] via SUBCUTANEOUS
  Administered 2012-09-25: 3 [IU] via SUBCUTANEOUS

## 2012-09-21 MED ORDER — ASPIRIN EC 325 MG PO TBEC: 325.0000 mg | DELAYED_RELEASE_TABLET | Freq: Every day | ORAL | Status: DC

## 2012-09-21 MED ORDER — SENNOSIDES-DOCUSATE SODIUM 8.6-50 MG PO TABS
1.0000 | ORAL_TABLET | Freq: Every evening | ORAL | Status: DC | PRN
  Filled 2012-09-21: qty 1

## 2012-09-21 NOTE — Progress Notes (Addendum)
Note re: Aspirin.  Pharmacy called secondary to pt's order for po ASA and pt hasn't passed swallow study yet. Rectal ASA is not an option because pt has rectal cancer. Will hold Aspirin for tonight until Speech can eval pt. Craige Cotta, NP Addendum: This NP spoke to neuro on call, Dr. Leroy Kennedy. Neuro's earlier note says to avoid antiplatelet therapy secondary to rectal ca and low Hct. Given these facts and above issues with dysphagia, will d/c ASA altogether at this point, and have this reevaluated tomorrow. CBC in am. Dr. Leroy Kennedy agrees with this plan. Craige Cotta, NP

## 2012-09-21 NOTE — H&P (Addendum)
[facility]                                                                                    Patient Demographics  Rachel Benton, is a 72 y.o. female  CSN: [id number]  MRN: [id number]  DOB - 1940-02-21  Admit Date - 09/21/2012  Outpatient Primary MD for the patient is Elby Showers, MD   With History of -  Past Medical History  Diagnosis Date  . Hypertension   . Diabetes mellitus without complication   . Hyperlipidemia   . Back pain   . Insomnia   . Hyperlipidemia   . Stroke 2009  . Hemorrhoids   . History of recent fall 07/05/12  . Cancer 07/21/12    rectal, lymphadenopathy      Past Surgical History  Procedure Laterality Date  . Hip arthroplasty Left 2009    fall injury  . Abdominal hysterectomy      TAH, > 37 yrs ago  . Bladder suspension      many yrs ago    in for   Chief Complaint  Patient presents with  . Code Stroke     HPI  Rachel Benton  is a 72 y.o. female, with recent diagnosis of rectal cancer with rectal bleeding under the care of Dr. Truett Perna and Dr. 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 Head CT did not show any acute change, due to her rectal cancer and history of rectal bleeding she was not a TPA candidate, after neurology saw the patient they recommended hospitalist admission.    Review of Systems    Patient is somewhat somnolent, unreliable historian, answers few questions however I doubt the reliability of her answers, currently denies headache chest pain or abdominal pain. Will not had few questions, moves right sided  extremities to command.   Social History History  Substance Use Topics  . Smoking status: Never Smoker   . Smokeless tobacco: Never Used  . Alcohol Use: No      Family History Family History  Problem Relation Age of Onset  . Cancer Sister     lung  . Cancer Daughter     cervical  . Diabetes Mother       Prior to Admission medications   Medication Sig Start Date End Date Taking? Authorizing Provider  Aloe Vera LIQD Take 60 mLs by mouth 3 (three) times daily. Mixed in juice   Yes Historical Provider, MD  ALPRAZolam (XANAX) 0.25 MG tablet Take 0.25 mg by mouth 3 (three) times daily as needed for anxiety.   Yes Historical Provider, MD  emollient (BIAFINE) cream Apply 1 application topically See admin instructions. To bottom after every bathroom trip   Yes Historical Provider, MD  fentaNYL (DURAGESIC - DOSED MCG/HR) 25  MCG/HR patch Place 1 patch onto the skin every 3 (three) days.   Yes Historical Provider, MD  ferrous sulfate 325 (65 FE) MG tablet Take 325 mg by mouth 2 (two) times daily.    Yes Historical Provider, MD  fluconazole (DIFLUCAN) 100 MG tablet Take 1 tablet (100 mg total) by mouth daily. 09/15/12  Yes Billie Lade, MD  metFORMIN (GLUCOPHAGE) 1000 MG tablet Take 1,000 mg by mouth 2 (two) times daily with a meal.   Yes Historical Provider, MD  metFORMIN (GLUCOPHAGE) 1000 MG tablet Take 500 mg by mouth daily after lunch.   Yes Historical Provider, MD  ondansetron (ZOFRAN) 4 MG tablet Take 4 mg by mouth every 8 (eight) hours as needed for nausea.   Yes Historical Provider, MD  OVER THE COUNTER MEDICATION Radiology cream to radiation burns on her mid section three times daily   Yes Historical Provider, MD  polyethylene glycol (MIRALAX / GLYCOLAX) packet Take 17 g by mouth daily as needed (constipation).    Yes Historical Provider, MD  pravastatin (PRAVACHOL) 40 MG tablet Take 40 mg by mouth daily.   Yes Historical Provider, MD  prochlorperazine (COMPAZINE) 10 MG tablet Take  10 mg by mouth every 6 (six) hours as needed (nausea).   Yes Historical Provider, MD  capecitabine (XELODA) 500 MG tablet Take #3 (1500mg ) po every am & #2 (1000mg ) po every pm = 2500 mg total daily dose Take on days of radiation therapy only M-F 09/08/12   Ladene Artist, MD    Allergies  Allergen Reactions  . Ace Inhibitors   . Aspirin   . Fish Oil     REACTION: GI  . Glipizide     REACTION: hypoglycemia  . Ibuprofen   . Rosiglitazone Maleate     REACTION: edema  . Sulfonamide Derivatives     Physical Exam  Vitals  Blood pressure 140/79, pulse 106, temperature 97.4 F (36.3 C), temperature source Oral, resp. rate 17, SpO2 100.00%.   1. General white female lying in bed in NAD,    2. Normal affect and insight, Not Suicidal or Homicidal, Awake Alert, Oriented X 3.  3. No F.N deficits, she has left-sided facial droop, she has left-sided visual neglect, left-sided extremities are 3/5 strength, right-sided are 5/5.  4. Ears and Eyes appear Normal, Conjunctivae clear, PERRLA. Moist Oral Mucosa.  5. Supple Neck, No JVD, No cervical lymphadenopathy appriciated, No Carotid Bruits.  6. Symmetrical Chest wall movement, Good air movement bilaterally, CTAB.  7. RRR, No Gallops, Rubs or Murmurs, No Parasternal Heave.  8. Positive Bowel Sounds, Abdomen Soft, Non tender, No organomegaly appriciated,No rebound -guarding or rigidity.  9.  No Cyanosis, Normal Skin Turgor, No Skin Rash or Bruise.  10. Good muscle tone,  joints appear normal , no effusions, Normal ROM.  11. No Palpable Lymph Nodes in Neck or Axillae     Data Review  CBC  Recent Labs Lab 09/17/12 1507 09/21/12 1332 09/21/12 1343  WBC 4.0 4.0  --   HGB 10.0* 9.8* 9.9*  HCT 31.8* 29.9* 29.0*  PLT 104 confirmed both analyzers* 95*  --   MCV 79.1* 80.6  --   MCH 24.9* 26.4  --   MCHC 31.4* 32.8  --   RDW 30.4* 31.0*  --   LYMPHSABS 0.3* 0.4*  --   MONOABS 0.2 0.3  --   EOSABS 0.1 0.0  --   BASOSABS 0.0  0.0  --    ------------------------------------------------------------------------------------------------------------------  Chemistries  Recent Labs Lab 09/21/12 1332 09/21/12 1343  NA 130* 131*  K 3.4* 3.4*  CL 92* 93*  CO2 25  --   GLUCOSE 263* 256*  BUN 8 6  CREATININE 0.49* 0.60  CALCIUM 8.3*  --   AST 14  --   ALT 8  --   ALKPHOS 230*  --   BILITOT 0.8  --    ------------------------------------------------------------------------------------------------------------------ CrCl is unknown because both a height and weight (above a minimum accepted value) are required for this calculation. ------------------------------------------------------------------------------------------------------------------ No results found for this basename: TSH, T4TOTAL, FREET3, T3FREE, THYROIDAB,  in the last 72 hours   Coagulation profile  Recent Labs Lab 09/21/12 1332  INR 1.34   ------------------------------------------------------------------------------------------------------------------- No results found for this basename: DDIMER,  in the last 72 hours -------------------------------------------------------------------------------------------------------------------  Cardiac Enzymes  Recent Labs Lab 09/21/12 1332  TROPONINI <0.30   ------------------------------------------------------------------------------------------------------------------ No components found with this basename: POCBNP,    ---------------------------------------------------------------------------------------------------------------  Urinalysis    Component Value Date/Time   COLORURINE YELLOW 07/08/2012 1940   APPEARANCEUR CLOUDY* 07/08/2012 1940   LABSPEC 1.015 08/24/2012 1553   LABSPEC 1.023 07/08/2012 1940   PHURINE 5.5 07/08/2012 1940   GLUCOSEU 100 08/24/2012 1553   GLUCOSEU 250* 07/08/2012 1940   HGBUR MODERATE* 07/08/2012 1940   BILIRUBINUR SMALL* 07/08/2012 1940   KETONESUR NEGATIVE  07/08/2012 1940   PROTEINUR NEGATIVE 07/08/2012 1940   UROBILINOGEN Color Interference 08/24/2012 1553   UROBILINOGEN 1.0 07/08/2012 1940   NITRITE NEGATIVE 07/08/2012 1940   LEUKOCYTESUR SMALL* 07/08/2012 1940    ----------------------------------------------------------------------------------------------------------------  Imaging results:   Ct Head Wo Contrast  09/21/2012   *RADIOLOGY REPORT*  Clinical Data: 72 year old female - code stroke - altered mental status.  CT HEAD WITHOUT CONTRAST  Technique:  Contiguous axial images were obtained from the base of the skull through the vertex without contrast.  Comparison: 06/15/2008 CT and MRI  Findings: Remote right cerebellar and right thalamic infarcts noted as well as mild chronic small vessel white matter ischemic changes.  No acute intracranial abnormalities are identified, including mass lesion or mass effect, hydrocephalus, extra-axial fluid collection, midline shift, hemorrhage, or acute infarction.  The visualized bony calvarium is unremarkable.  IMPRESSION: No evidence of acute intracranial abnormality.  Remote right cerebellar and right thalamic infarcts and mild chronic small vessel white matter ischemic changes.   Original Report Authenticated By: Harmon Pier, M.D.    My personal review of EKG: S.tach , Rate  108 /min, no acute ST changes.     Assessment & Plan   1. 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  Further stroke workup by telemetry monitoring, MRI MRA of the brain, carotid duplex, echogram,  A1c, lipid panel, PT OT evaluation, speech therapy evaluation. Formal neurology follow up by tomorrow, suspect patient will require rehabilitation versus CIR placement upon discharge. I have discussed with the family that dysphagia might become an issue with the patient, they wish her to be DO NOT RESUSCITATE, they will think about feeding tube if need arises.   2. Diabetes mellitus type 2.  Stable check A1c, every 4 hours sliding scale, hold oral hypoglycemics.   3. Hypertension. Stable, will allow for permissive hypertension, when necessary IV hydralazine with written parameters.   4. Dyslipidemia continue home dose statin.   5. Anxiety once taking by mouth low-dose oral benzodiazepines will be resumed.   6. Recent rectal cancer once stable outpatient followup with Dr. Truett Perna.   7. Hypokalemia, replace IV, hyponatremia, for now normal saline, check urine sodium, urine osmolality and serum osmolality. Repeat BMP in the morning.     DVT Prophylaxis   SCDs    AM Labs Ordered, also please review Full Orders  Family Communication: Admission, patients condition and plan of care including tests being ordered have been discussed with the patient and daughters who indicate understanding and agree with the plan and Code Status.  Code Status DNR  Likely DC to SNF/CIR  Time spent in minutes : 40  Condition GUARDED   Leroy Sea M.D on 09/21/2012 at 3:57 PM  Between 7am to 7pm - Pager - [phone]  After 7pm go to [email or web address] - password [email or web address]  And look for the night coverage person covering me after hours  [facility] Office  [phone]

## 2012-09-21 NOTE — ED Notes (Signed)
Pt mumbling the words to baptist hymn with her pastor.

## 2012-09-21 NOTE — Progress Notes (Signed)
Shift event concerning code status: Pt's daughter, Rachel Benton, visiting without any other family members present. RN paged this NP because Rachel Benton is disagreeing with the "do not resuscitate order" written earlier today by attending. Attending's note says that he thoroughly discussed code status, plan, prognosis, etc with the family and the decision was "DNR". However, Rachel Benton says that was the other sister, Rachel Benton, who doesn't "have the right to make that decision by herself". Rachel Benton feels it is "too soon" to make her mother a DNR. I explained how the hierarchy for this type of decision making works.Marland KitchenMarland KitchenIf her mother was alert and oriented, she has the final say. However, since her mother is not oriented and there is no written designated healthcare POA, the decision would first fall to the pt's husband. I advised Rachel to talk to her sister and father and come to a decision together as a family.  Being that there is a discrepancy, for tonight, the pt will be a FULL CODE. Order changed. Explained to [place] that if her mother would deteriorate overnight, I will call family to readdress situation as needed. She understands. Rachel Norman, NP [facility]

## 2012-09-21 NOTE — ED Notes (Signed)
Pt status improving, pt opening her eyes more spontaneously, right grip strong, left grip weak but is present. Pt moving left leg slightly to command.

## 2012-09-21 NOTE — Telephone Encounter (Signed)
Message from pt's daughter at 1228 reporting pt went to restroom and became weak/ lethargic and began slurring speech. Attempted to return call, no answer. Left message to take pt to call EMS ASAP. Returned call to pt's daughter, she stated she called an ambulance. Will make MD aware.

## 2012-09-21 NOTE — ED Notes (Signed)
Per EMS- Pt comes from home was using the potty chair and slumped over. Has left sided weakness and right sided gaze. Hx of CVA with no deficits. Has hx of cancer. Pt is following commands, is lethargic but will arouse. CBG 257, BP initial 140/62 dropped to 92/70, HR 110. According to family patient walks, talks independently.

## 2012-09-21 NOTE — ED Notes (Signed)
Code stroke called at 1311; Pt arrival 1324; LSN 1210; EDP exam 1328; Stroke Team arrival 1311; Neurologist arrival 1321; Pt arrival in CT 1329; Phlebotomist arrival 1324; CT read 1338.

## 2012-09-21 NOTE — ED Provider Notes (Signed)
CSN: [id number]     Arrival date & time 09/21/12  1324 History     First MD Initiated Contact with Patient 09/21/12 1327     Chief Complaint  Patient presents with  . Code Stroke   (Consider location/radiation/quality/duration/timing/severity/associated sxs/prior Treatment) HPI 72 y.o. 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  Family notes she was helping her use the bathroom and as she was holding her up she went limp and had difficulty speaking.  Daughter notes she had similar episode last week that resolved spontaneously.  Symptoms have been constant.  POCT glucose elevated with EMS.  She arrived to the ED as a Code Stroke via EMS and Neurology was present on her arrival.   Past Medical History  Diagnosis Date  . Hypertension   . Diabetes mellitus without complication   . Hyperlipidemia   . Back pain   . Insomnia   . Hyperlipidemia   . Stroke 2009  . Hemorrhoids   . History of recent fall 07/05/12  . Cancer 07/21/12    rectal, lymphadenopathy   Past Surgical History  Procedure Laterality Date  . Hip arthroplasty Left 2009    fall injury  . Abdominal hysterectomy      TAH, > 37 yrs ago  . Bladder suspension      many yrs ago   Family History  Problem Relation Age of Onset  . Cancer Sister     lung  . Cancer Daughter     cervical  . Diabetes Mother    History  Substance Use Topics  . Smoking status: Never Smoker   . Smokeless tobacco: Never Used  . Alcohol Use: No   OB History   Grav Para Term Preterm Abortions TAB SAB Ect Mult Living                 Review of Systems  Unable to perform ROS: Mental status change  Constitutional: Negative for fever.  HENT: Negative for sore throat.    Cardiovascular: Negative for chest pain.  Gastrointestinal: Negative for abdominal pain.  Skin: Negative for rash.  Neurological: Positive for facial asymmetry, speech difficulty and weakness. Negative for syncope.    Allergies  Ace inhibitors; Aspirin; Fish oil; Glipizide; Ibuprofen; Rosiglitazone maleate; and Sulfonamide derivatives  Home Medications   Current Outpatient Rx  Name  Route  Sig  Dispense  Refill  . Aloe Vera LIQD   Oral   Take 60 mLs by mouth 3 (three) times daily. Mixed in juice         . ALPRAZolam (XANAX) 0.25 MG tablet   Oral   Take 0.25 mg by mouth 3 (three) times daily as needed for anxiety.         Marland Kitchen emollient (BIAFINE) cream   Topical   Apply 1 application topically See admin instructions. To bottom after every bathroom trip         . fentaNYL (DURAGESIC - DOSED MCG/HR) 25 MCG/HR patch   Transdermal   Place 1 patch onto the skin every 3 (three) days.         . ferrous sulfate 325 (65 FE) MG tablet   Oral   Take 325 mg by mouth 2 (two) times daily.          Marland Kitchen  fluconazole (DIFLUCAN) 100 MG tablet   Oral   Take 1 tablet (100 mg total) by mouth daily.   8 tablet   0     Take two tablets on day 1   . metFORMIN (GLUCOPHAGE) 1000 MG tablet   Oral   Take 1,000 mg by mouth 2 (two) times daily with a meal.         . metFORMIN (GLUCOPHAGE) 1000 MG tablet   Oral   Take 500 mg by mouth daily after lunch.         . ondansetron (ZOFRAN) 4 MG tablet   Oral   Take 4 mg by mouth every 8 (eight) hours as needed for nausea.         Marland Kitchen OVER THE COUNTER MEDICATION      Radiology cream to radiation burns on her mid section three times daily         . polyethylene glycol (MIRALAX / GLYCOLAX) packet   Oral   Take 17 g by mouth daily as needed (constipation).          . pravastatin (PRAVACHOL) 40 MG tablet   Oral   Take 40 mg by mouth daily.         . prochlorperazine (COMPAZINE) 10 MG tablet   Oral   Take 10 mg by mouth every 6  (six) hours as needed (nausea).         . capecitabine (XELODA) 500 MG tablet      Take #3 (1500mg ) po every am & #2 (1000mg ) po every pm = 2500 mg total daily dose Take on days of radiation therapy only M-F   70 tablet   0    BP 134/61  Pulse 96  Temp(Src) 97.4 F (36.3 C) (Oral)  Resp 14  SpO2 100% Physical Exam  Nursing note and vitals reviewed. Constitutional: She appears well-developed and well-nourished. She has a sickly appearance. No distress.  HENT:  Head: Normocephalic and atraumatic.  Mouth/Throat: Oropharynx is clear and moist. No oropharyngeal exudate.  Eyes: Conjunctivae and EOM are normal. Pupils are equal, round, and reactive to light.  Gaze fixed to right however able to cross to midline on command  Neck: Normal range of motion.  Cardiovascular: Normal rate, regular rhythm, normal heart sounds and intact distal pulses.  Exam reveals no gallop and no friction rub.   No murmur heard. Pulmonary/Chest: Effort normal and breath sounds normal. No respiratory distress. She has no wheezes. She has no rales.  Abdominal: Soft. She exhibits no distension. There is no tenderness. There is no guarding.  Musculoskeletal: She exhibits no edema and no tenderness.  Neurological: She is alert. A cranial nerve deficit is present. Coordination abnormal.  Left facial droop, left arm and left leg weakness left sided neglect to bilateral stimuli Right sided gaze Speech slowed, difficult to understand  Skin: Skin is warm and dry. No rash noted. She is not diaphoretic. No erythema.    ED Course   Procedures (including critical care time)  Labs Reviewed  PROTIME-INR - Abnormal; Notable for the following:    Prothrombin Time 16.3 (*)    All other components within normal limits  CBC - Abnormal; Notable for the following:    RBC 3.71 (*)    Hemoglobin 9.8 (*)    HCT 29.9 (*)    RDW 31.0 (*)    Platelets 95 (*)    All other components within normal limits  DIFFERENTIAL -  Abnormal; Notable for the following:  Neutrophils Relative % 82 (*)    Lymphocytes Relative 9 (*)    Lymphs Abs 0.4 (*)    All other components within normal limits  COMPREHENSIVE METABOLIC PANEL - Abnormal; Notable for the following:    Sodium 130 (*)    Potassium 3.4 (*)    Chloride 92 (*)    Glucose, Bld 263 (*)    Creatinine, Ser 0.49 (*)    Calcium 8.3 (*)    Total Protein 5.5 (*)    Albumin 2.2 (*)    Alkaline Phosphatase 230 (*)    All other components within normal limits  URINE RAPID DRUG SCREEN (HOSP PERFORMED) - Abnormal; Notable for the following:    Benzodiazepines POSITIVE (*)    All other components within normal limits  URINALYSIS, ROUTINE W REFLEX MICROSCOPIC - Abnormal; Notable for the following:    Color, Urine AMBER (*)    Glucose, UA 250 (*)    Bilirubin Urine MODERATE (*)    Ketones, ur 15 (*)    Protein, ur 30 (*)    Leukocytes, UA MODERATE (*)    All other components within normal limits  URINE MICROSCOPIC-ADD ON - Abnormal; Notable for the following:    Casts GRANULAR CAST (*)    All other components within normal limits  GLUCOSE, CAPILLARY - Abnormal; Notable for the following:    Glucose-Capillary 244 (*)    All other components within normal limits  POCT I-STAT, CHEM 8 - Abnormal; Notable for the following:    Sodium 131 (*)    Potassium 3.4 (*)    Chloride 93 (*)    Glucose, Bld 256 (*)    Calcium, Ion 1.06 (*)    Hemoglobin 9.9 (*)    HCT 29.0 (*)    All other components within normal limits  ETHANOL  APTT  TROPONIN I  SODIUM, URINE, RANDOM  CREATININE, URINE, RANDOM  OSMOLALITY, URINE  OSMOLALITY  POCT I-STAT TROPONIN I   Ct Head Wo Contrast  09/21/2012   *RADIOLOGY REPORT*  Clinical Data: 72 year old female - code stroke - altered mental status.  CT HEAD WITHOUT CONTRAST  Technique:  Contiguous axial images were obtained from the base of the skull through the vertex without contrast.  Comparison: 06/15/2008 CT and MRI  Findings:  Remote right cerebellar and right thalamic infarcts noted as well as mild chronic small vessel white matter ischemic changes.  No acute intracranial abnormalities are identified, including mass lesion or mass effect, hydrocephalus, extra-axial fluid collection, midline shift, hemorrhage, or acute infarction.  The visualized bony calvarium is unremarkable.  IMPRESSION: No evidence of acute intracranial abnormality.  Remote right cerebellar and right thalamic infarcts and mild chronic small vessel white matter ischemic changes.   Original Report Authenticated By: Harmon Pier, M.D.   Dg Chest Port 1 View  09/21/2012   *RADIOLOGY REPORT*  Clinical Data: Cough, weakness, question stroke  PORTABLE CHEST - 1 VIEW  Comparison: Portable exam 1627 hours compared to 06/15/2008  Findings: Enlargement of cardiac silhouette. Tortuous aorta with atherosclerotic calcification at arch. Mediastinal contours and pulmonary vascularity normal. Lungs clear. No pleural effusion or pneumothorax. Bones demineralized.  IMPRESSION: Mild enlargement of cardiac silhouette. No acute abnormalities.   Original Report Authenticated By: Ulyses Southward, M.D.   1. Stroke   2. Cancer   3. Type II or unspecified type diabetes mellitus without mention of complication, not stated as uncontrolled   4. Unspecified essential hypertension     MDM  73 y.o. female with locally  advanced lymph node positive rectal cancer obtaining radiation and oral chemotherapy, hypertension, DM, hyperlipidemia, stroke without residual deficits per family presenting as code stroke following patient going limp and noted to have right gaze preference and left sided weakness at 12:10PM today.  Patient arrived as Code Stroke with Neurology at the bedside.  POCT glucose WNL, and given risk factors, symptoms and exam concern is for stroke.  Emergent head CT revealed no intracranial bleed or abnormalities.  Neurology discussed possible tPA with patient's oncologist, however given  concern of possible rectal bleeding and concern for life threatening bleed as result of tPA in patient with known rectal cancer decided not to administer tPA.  Patient to be admitted to the hospitalist service and have MR of brain.  Patient admitted in stable condition.   Rhae Lerner, MD 09/21/12 1945

## 2012-09-21 NOTE — ED Notes (Signed)
Pt is resting in bed, opens eyes to command but is lethargic. Pt able to mumble daughters name. Right grip present and strong, moves left fingers and toes.

## 2012-09-21 NOTE — Consult Note (Signed)
Referring Physician: Ghim    Chief Complaint: stroke  HPI:                                                                                                                                         Rachel Benton is an 72 y.o. 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 Truett Perna.  Patient currently is obtaining radiation and on oral Xeloda.  Patient was at home today with family.  At 12:10 she was brought to the bathroom.   After her family members helped clean her, they were trying to pull up her pants when patient went limp, showed right gaze preference and not responding well.  EMS was called and patient was brought to ED as a code stroke.  Initial exam showed awake patient, able to count my fingers on the right, left facial droop, left arm and leg flaccidity. Initial CT head was negative for acute stole or bleed.  It was discussed with patients family that tPA was a option, but family wanted to run is by Dr. Truett Perna first, given her rectal cancer.  In speaking with Dr. Truett Perna, there was a concern she may be bleeding from her rectal cancer given her depressed hemoglobin/ Hematocrit.  For this reason the decision to administer tPA was canceled.  After conversation with family patient with mRankin of 3.  Date last known well: 8.4.14 Time last known well: 12:10 tPA Given: No: possible rectal bleeding  Past Medical History  Diagnosis Date  . Hypertension   . Diabetes mellitus without complication   . Hyperlipidemia   . Back pain   . Insomnia   . Hyperlipidemia   . Stroke 2009  . Hemorrhoids   . History of recent fall 07/05/12  . Cancer 07/21/12    rectal, lymphadenopathy    Past Surgical History  Procedure Laterality Date  . Hip arthroplasty Left 2009    fall injury  . Abdominal hysterectomy      TAH, > 37 yrs ago  . Bladder suspension      many yrs ago    Family History  Problem Relation Age of Onset  . Cancer Sister     lung  . Cancer  Daughter     cervical  . Diabetes Mother    Social History:  reports that she has never smoked. She has never used smokeless tobacco. She reports that she does not drink alcohol or use illicit drugs.  Allergies:  Allergies  Allergen Reactions  . Ace Inhibitors     REACTION: ? reaction  . Aspirin     REACTION: ? reaction  . Fish Oil     REACTION: GI  . Glipizide     REACTION: hypoglycemia  . Ibuprofen     REACTION: ? reaction  . Rosiglitazone Maleate     REACTION: edema  . Sulfonamide Derivatives  REACTION: ? reaction    Medications:                                                                                                                           No current facility-administered medications for this encounter.   Current Outpatient Prescriptions  Medication Sig Dispense Refill  . Aloe Vera LIQD Apply topically.      . ALPRAZolam (XANAX) 0.25 MG tablet Take 1 tablet (0.25 mg total) by mouth at bedtime as needed for sleep.  30 tablet  0  . capecitabine (XELODA) 500 MG tablet Take #3 (1500mg ) po every am & #2 (1000mg ) po every pm = 2500 mg total daily dose Take on days of radiation therapy only M-F  70 tablet  0  . emollient (BIAFINE) cream Apply topically 2 (two) times daily.      . feeding supplement (PRO-STAT SUGAR FREE 64) LIQD Take 30 mLs by mouth 3 (three) times daily with meals.      . fentaNYL (DURAGESIC - DOSED MCG/HR) 25 MCG/HR Place 1 patch (25 mcg total) onto the skin every 3 (three) days.  5 patch  0  . ferrous sulfate 325 (65 FE) MG tablet Take 325 mg by mouth daily with breakfast.      . fluconazole (DIFLUCAN) 100 MG tablet Take 1 tablet (100 mg total) by mouth daily.  8 tablet  0  . HYDROcodone-acetaminophen (NORCO/VICODIN) 5-325 MG per tablet Take 1 tablet by mouth every 4 (four) hours as needed for pain.  25 tablet  0  . metFORMIN (GLUCOPHAGE) 1000 MG tablet TAKE ONE TABLET IN THE MORNING, ONE-HALF TABLET AT LUNCH AND ONE TABLET IN THE EVENING (NEED  APPOINTMENT)  30 tablet  0  . ondansetron (ZOFRAN) 4 MG tablet Take 1 tablet (4 mg total) by mouth every 8 (eight) hours as needed for nausea.  20 tablet  0  . oxyCODONE-acetaminophen (PERCOCET/ROXICET) 5-325 MG per tablet Take 1 tablet by mouth every 6 (six) hours as needed for pain.  60 tablet  0  . polyethylene glycol (MIRALAX / GLYCOLAX) packet Take 17 g by mouth daily.      . pravastatin (PRAVACHOL) 40 MG tablet Take 40 mg by mouth daily.      . prochlorperazine (COMPAZINE) 10 MG tablet Take 1 tablet (10 mg total) by mouth every 6 (six) hours as needed.  30 tablet  0     ROS:  History obtained from daughter  General ROS: positive for - general fatigue Psychological ROS: negative for - behavioral disorder, hallucinations, memory difficulties, mood swings or suicidal ideation Ophthalmic ROS: negative for - blurry vision, double vision, eye pain or loss of vision ENT ROS: negative for - epistaxis, nasal discharge, oral lesions, sore throat, tinnitus or vertigo Allergy and Immunology ROS: negative for - hives or itchy/watery eyes Hematological and Lymphatic ROS: negative for - bleeding problems, bruising or swollen lymph nodes Endocrine ROS: negative for - galactorrhea, hair pattern changes, polydipsia/polyuria or temperature intolerance Respiratory ROS: negative for - cough, hemoptysis, shortness of breath or wheezing Cardiovascular ROS: negative for - chest pain, dyspnea on exertion, edema or irregular heartbeat Gastrointestinal ROS: negative for - abdominal pain, diarrhea, hematemesis, nausea/vomiting or stool incontinence Genito-Urinary ROS: negative for - dysuria, hematuria, incontinence or urinary frequency/urgency Musculoskeletal ROS: negative for - joint swelling or muscular weakness Neurological ROS: as noted in HPI Dermatological ROS: negative for  rash and skin lesion changes  Neurologic Examination:                                                                                                      Blood pressure 152/81, pulse 104, temperature 97.4 F (36.3 C), temperature source Oral, resp. rate 19, SpO2 99.00%.  Mental Status: Alert, not oriented,able to follow simple commands such as squeeze hand, smile, stick tongue out.  Speech dysarthric without evidence of aphasia.   Cranial Nerves: II: Discs flat bilaterally; Visual fields shows a left field cut, pupils equal, round, reactive to light and accommodation III,IV, VI: ptosis not present, extra-ocular motions intact bilaterally but at rest she has a right gaze preference V,VII: smile asymmetric on the left, facial light touch sensation decreased on the left lower face and neglects left aspect of face to DSS VIII: hearing normal bilaterally IX,X: gag reflex present XI: bilateral shoulder shrug XII: midline tongue extension Motor: Right : Upper extremity   4/5    Left:     Upper extremity   3-/5 with drift falling to bed   Lower extremity   4/5     Lower extremity   3/5 with noxious stimuli Tone and bulk:normal tone throughout; no atrophy noted Sensory: neglects the left arm and leg to DSS and light touch but does withdraw to noxious stimuli Deep Tendon Reflexes:  2+ bilateral UE, 1+ bilateral KJ , no AJ  Plantars: Right: downgoing   Left: downgoing CV: pulses palpable throughout    Results for orders placed during the hospital encounter of 09/21/12 (from the past 48 hour(s))  CBC     Status: Abnormal (Preliminary result)   Collection Time    09/21/12  1:32 PM      Result Value Range   WBC 4.0  4.0 - 10.5 K/uL   RBC 3.71 (*) 3.87 - 5.11 MIL/uL   Hemoglobin 9.8 (*) 12.0 - 15.0 g/dL   HCT 65.7 (*) 84.6 - 96.2 %   MCV 80.6  78.0 - 100.0 fL   MCH 26.4  26.0 - 34.0 pg   MCHC 32.8  30.0 - 36.0 g/dL   RDW 56.2 (*) 13.0 - 86.5 %   Platelets PENDING  150 - 400 K/uL   DIFFERENTIAL     Status: None   Collection Time    09/21/12  1:32 PM      Result Value Range   Neutrophils Relative % PENDING  43 - 77 %   Neutro Abs PENDING  1.7 - 7.7 K/uL   Band Neutrophils PENDING  0 - 10 %   Lymphocytes Relative PENDING  12 - 46 %   Lymphs Abs PENDING  0.7 - 4.0 K/uL   Monocytes Relative PENDING  3 - 12 %   Monocytes Absolute PENDING  0.1 - 1.0 K/uL   Eosinophils Relative PENDING  0 - 5 %   Eosinophils Absolute PENDING  0.0 - 0.7 K/uL   Basophils Relative PENDING  0 - 1 %   Basophils Absolute PENDING  0.0 - 0.1 K/uL   WBC Morphology PENDING     RBC Morphology PENDING     Smear Review PENDING     nRBC PENDING  0 /100 WBC   Metamyelocytes Relative PENDING     Myelocytes PENDING     Promyelocytes Absolute PENDING     Blasts PENDING    POCT I-STAT TROPONIN I     Status: None   Collection Time    09/21/12  1:41 PM      Result Value Range   Troponin i, poc 0.01  0.00 - 0.08 ng/mL   Comment 3            Comment: Due to the release kinetics of cTnI,     a negative result within the first hours     of the onset of symptoms does not rule out     myocardial infarction with certainty.     If myocardial infarction is still suspected,     repeat the test at appropriate intervals.  POCT I-STAT, CHEM 8     Status: Abnormal   Collection Time    09/21/12  1:43 PM      Result Value Range   Sodium 131 (*) 135 - 145 mEq/L   Potassium 3.4 (*) 3.5 - 5.1 mEq/L   Chloride 93 (*) 96 - 112 mEq/L   BUN 6  6 - 23 mg/dL   Creatinine, Ser 7.84  0.50 - 1.10 mg/dL   Glucose, Bld 696 (*) 70 - 99 mg/dL   Calcium, Ion 2.95 (*) 1.13 - 1.30 mmol/L   TCO2 26  0 - 100 mmol/L   Hemoglobin 9.9 (*) 12.0 - 15.0 g/dL   HCT 28.4 (*) 13.2 - 44.0 %   Ct Head Wo Contrast  09/21/2012   *RADIOLOGY REPORT*  Clinical Data: 72 year old female - code stroke - altered mental status.  CT HEAD WITHOUT CONTRAST  Technique:  Contiguous axial images were obtained from the base of the skull through the  vertex without contrast.  Comparison: 06/15/2008 CT and MRI  Findings: Remote right cerebellar and right thalamic infarcts noted as well as mild chronic small vessel white matter ischemic changes.  No acute intracranial abnormalities are identified, including mass lesion or mass effect, hydrocephalus, extra-axial fluid collection, midline shift, hemorrhage, or acute infarction.  The visualized bony calvarium is unremarkable.  IMPRESSION: No evidence of acute intracranial abnormality.  Remote right cerebellar and right thalamic infarcts and mild chronic small vessel white matter ischemic changes.   Original Report Authenticated By: Harmon Pier, M.D.    Felicie Morn  PA-C [facility] [phone]  09/21/2012, 2:08 PM   Assessment: 72 y.o. female presenting with left hemiparesis, LHH and left neglect.  Has history of rectal CA s/p chemotherapy and radiation.  Hematocrit low-felt to be rectally bleeding and therefore not a tPA candidate.  With high mRankin score not felt to be an intervention candidate either.  Patient to be admitted for further therapy.    Stroke Risk Factors - diabetes mellitus, hyperlipidemia, hypertension and stroke 2009  Plan: 1. HgbA1c, fasting lipid panel 2. MRI, MRA  of the brain without contrast 3. PT consult, OT consult, Speech consult 4. Echocardiogram 5. Carotid dopplers 6. Prophylactic therapy-Would hold antiplatelet therapy at this time.   7. Blood sugar management 8. Telemetry monitoring 9. Frequent neuro checks  Thana Farr, MD [facility] [phone]

## 2012-09-21 NOTE — ED Provider Notes (Addendum)
I saw and evaluated the patient, reviewed the resident's note and I agree with the findings and plan.  Pt with h/o prior stroke, left thalamic in April 2010, seems to have recovered completely from that episode.  Pt receiving chemo for cancer.  Pt was put in bathroom with family at 12:10 PM.  Pt was founds lumped over, not moving left arm.  EMS transported, code stroke called.  Rapid response team and Neurology has seen pt.  Pt with h/o DM, prior CVA, several risks.  No recent surgery, head injury. Pt may be candidate for TPA administration following complete Neurology assessment.  I reviewed head CT myself.  No hemorrhage.      CRITICAL CARE Performed by: Lear Ng Total critical care time: 30 min Critical care time was exclusive of separately billable procedures and treating other patients. Critical care was necessary to treat or prevent imminent or life-threatening deterioration. Critical care was time spent personally by me on the following activities: development of treatment plan with patient and/or surrogate as well as nursing, discussions with consultants, evaluation of patient's response to treatment, examination of patient, obtaining history from patient or surrogate, ordering and performing treatments and interventions, ordering and review of laboratory studies, ordering and review of radiographic studies, pulse oximetry and re-evaluation of patient's condition.   Impression: Stroke Colon cancer    2:53 PM Due to h/o possible GI bleeding from h/o cancer, decision made not to give TPA.  Neurology recommends inpt admission by medicine and they will continue to follow.     Gavin Pound. Oletta Lamas, MD 09/21/12 1403  Gavin Pound. Jahmarion Popoff, MD 09/22/12 1103

## 2012-09-22 ENCOUNTER — Inpatient Hospital Stay (HOSPITAL_COMMUNITY): Payer: Medicare Other

## 2012-09-22 DIAGNOSIS — I6350 Cerebral infarction due to unspecified occlusion or stenosis of unspecified cerebral artery: Secondary | ICD-10-CM

## 2012-09-22 DIAGNOSIS — R599 Enlarged lymph nodes, unspecified: Secondary | ICD-10-CM

## 2012-09-22 DIAGNOSIS — E43 Unspecified severe protein-calorie malnutrition: Secondary | ICD-10-CM | POA: Diagnosis present

## 2012-09-22 DIAGNOSIS — C20 Malignant neoplasm of rectum: Secondary | ICD-10-CM

## 2012-09-22 LAB — LIPID PANEL
Cholesterol: 142 mg/dL (ref 0–200)
LDL Cholesterol: 73 mg/dL (ref 0–99)
Total CHOL/HDL Ratio: 3.9 RATIO
VLDL: 33 mg/dL (ref 0–40)

## 2012-09-22 LAB — CBC
MCH: 26.2 pg (ref 26.0–34.0)
MCHC: 32.6 g/dL (ref 30.0–36.0)
MCV: 80.6 fL (ref 78.0–100.0)
Platelets: 89 10*3/uL — ABNORMAL LOW (ref 150–400)
RBC: 3.24 MIL/uL — ABNORMAL LOW (ref 3.87–5.11)

## 2012-09-22 LAB — BASIC METABOLIC PANEL
CO2: 27 mEq/L (ref 19–32)
Calcium: 8.3 mg/dL — ABNORMAL LOW (ref 8.4–10.5)
Chloride: 98 mEq/L (ref 96–112)
Creatinine, Ser: 0.32 mg/dL — ABNORMAL LOW (ref 0.50–1.10)
GFR calc non Af Amer: >90 mL/min (ref >90)
Potassium: 3.8 mEq/L (ref 3.5–5.1)
Sodium: 134 mEq/L — ABNORMAL LOW (ref 135–145)

## 2012-09-22 LAB — OSMOLALITY: Osmolality: 279 mOsm/kg (ref 275–300)

## 2012-09-22 LAB — MRSA PCR SCREENING: MRSA by PCR: NEGATIVE

## 2012-09-22 LAB — GLUCOSE, CAPILLARY: Glucose-Capillary: 137 mg/dL — ABNORMAL HIGH (ref 70–99)

## 2012-09-22 MED ORDER — WHITE PETROLATUM GEL
Status: AC
  Administered 2012-09-22: 21:00:00
  Filled 2012-09-22: qty 5

## 2012-09-22 MED ORDER — ASPIRIN EC 325 MG PO TBEC: 325.0000 mg | DELAYED_RELEASE_TABLET | Freq: Every day | ORAL | Status: DC

## 2012-09-22 MED ORDER — DEXTROSE 5 % IV SOLN
1.0000 g | INTRAVENOUS | Status: AC
  Administered 2012-09-22 – 2012-09-24 (×3): 1 g via INTRAVENOUS
  Filled 2012-09-22 (×3): qty 10

## 2012-09-22 MED ORDER — POTASSIUM CHLORIDE IN NACL 40-0.9 MEQ/L-% IV SOLN
INTRAVENOUS | Status: AC
  Administered 2012-09-22 – 2012-09-24 (×3): via INTRAVENOUS
  Filled 2012-09-22 (×4): qty 1000

## 2012-09-22 NOTE — Progress Notes (Signed)
INITIAL NUTRITION ASSESSMENT  Pt meets criteria for SEVERE MALNUTRITION in the context of chronic illness as evidenced by 7% weight loss x 1 month and estimated intake < 75% of her needs for > 1 month.  DOCUMENTATION CODES Per approved criteria  -Severe malnutrition in the context of chronic illness -Obesity Unspecified   INTERVENTION: Supplement diet as appropriate.   NUTRITION DIAGNOSIS: Malnutrition related to cancer and cancer related treatments as evidenced by 7% weight loss x 1 month and estimated intake < 75% of her needs for > 1 month .   Goal: Pt to meet >/= 90% of their estimated nutrition needs   Monitor:  Swallow ability, weight trend, plan of care  Reason for Assessment: Pt identified as at nutrition risk on the Malnutrition Screen Tool  72 y.o. female  Admitting Dx: Personal history of unspecified circulatory disease  ASSESSMENT: Pt with hx of locally advanced rectal cancer admitted with 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 Pt with decreased mental status. Swallow eval pending.  Pt unable to answer questions. Pt's daughter provides history and reports that pt has lost a lot of weight since starting chemo/XRT in the beginning of June. Daughter believes that pt was about 160 lb at that time. Per daughter pt has been lactose intolerant and they have been making her shakes from lactose free milk, prostat, peanut butter, etc. Pt has not tolerated ensure or boost. Pt c/o feeling constipated likely due to rectal tumor. Pt has been eating very little due to decreased appetite and feeling constipated.  Per daughter pt has had trouble swallowing PTA.  Swallow evaluation pending. DNR rescinded over night due to family issues. Per notes family would consider PEG.   Nutrition Focused Physical Exam:  Subcutaneous Fat:  Orbital Region: WNL Upper Arm Region: WNL Thoracic and Lumbar Region: WNL  Muscle:  Temple Region: WNL Clavicle Bone Region: WNL Clavicle  and Acromion Bone Region: WNL Scapular Bone Region: WNL Dorsal Hand: WNL Patellar Region: WNL Anterior Thigh Region: WNL Posterior Calf Region: WNL  Edema: not present   Height: Ht Readings from Last 1 Encounters:  09/08/12 4\' 9"  (1.448 m)    Weight: Wt Readings from Last 1 Encounters:  09/15/12 139 lb 9.6 oz (63.322 kg)    Ideal Body Weight: 43.1 kg   % Ideal Body Weight: 147%  Wt Readings from Last 10 Encounters:  09/15/12 139 lb 9.6 oz (63.322 kg)  09/08/12 141 lb 6.4 oz (64.139 kg)  09/08/12 141 lb 6.4 oz (64.139 kg)  09/01/12 140 lb 4.8 oz (63.64 kg)  08/25/12 145 lb 11.2 oz (66.089 kg)  08/20/12 147 lb (66.679 kg)  08/17/12 145 lb 3.2 oz (65.862 kg)  08/13/12 145 lb 6.4 oz (65.953 kg)  07/28/12 149 lb 6.4 oz (67.767 kg)  07/27/12 146 lb 8 oz (66.452 kg)    Usual Body Weight: 149 lb   % Usual Body Weight: 93%  BMI:  30.1  Estimated Nutritional Needs: Kcal: 1400-1600 Protein: 65-75 grams Fluid: > 1.5 L/day  Skin: reddened area on sacrum from chemo  Diet Order: NPO  EDUCATION NEEDS: -No education needs identified at this time   Intake/Output Summary (Last 24 hours) at 09/22/12 0829 Last data filed at 09/22/12 0721  Gross per 24 hour  Intake    825 ml  Output      0 ml  Net    825 ml    Last BM: 8/4   Labs:   Recent Labs Lab 09/21/12 1332 09/21/12  1343  NA 130* 131*  K 3.4* 3.4*  CL 92* 93*  CO2 25  --   BUN 8 6  CREATININE 0.49* 0.60  CALCIUM 8.3*  --   GLUCOSE 263* 256*    CBG (last 3)   Recent Labs  09/22/12 0037 09/22/12 0517 09/22/12 0717  GLUCAP 151* 173* 137*   Lab Results  Component Value Date   HGBA1C 8.2* 08/15/2009   Scheduled Meds: . insulin aspart  0-9 Units Subcutaneous Q4H  . simvastatin  40 mg Oral q1800    Continuous Infusions: . 0.9 % NaCl with KCl 40 mEq / L 75 mL/hr at 09/22/12 1610    Past Medical History  Diagnosis Date  . Hypertension   . Diabetes mellitus without complication   .  Hyperlipidemia   . Back pain   . Insomnia   . Hyperlipidemia   . Stroke 2009  . Hemorrhoids   . History of recent fall 07/05/12  . Cancer 07/21/12    rectal, lymphadenopathy    Past Surgical History  Procedure Laterality Date  . Hip arthroplasty Left 2009    fall injury  . Abdominal hysterectomy      TAH, > 37 yrs ago  . Bladder suspension      many yrs ago    Kendell Bane RD, LDN, CNSC [phone] Pager [phone] After Hours Pager

## 2012-09-22 NOTE — Evaluation (Signed)
Clinical/Bedside Swallow Evaluation Patient Details  Name: Rachel Benton MRN: [id number] Date of Birth: 06-13-1940  Today's Date: 09/22/2012 Time: 1105-1120 SLP Time Calculation (min): 15 min  Past Medical History:  Past Medical History  Diagnosis Date  . Hypertension   . Diabetes mellitus without complication   . Hyperlipidemia   . Back pain   . Insomnia   . Hyperlipidemia   . Stroke 2009  . Hemorrhoids   . History of recent fall 07/05/12  . Cancer 07/21/12    rectal, lymphadenopathy   Past Surgical History:  Past Surgical History  Procedure Laterality Date  . Hip arthroplasty Left 2009    fall injury  . Abdominal hysterectomy      TAH, > 37 yrs ago  . Bladder suspension      many yrs ago   HPI:  72 y.o. female, with recent diagnosis of rectal cancer with rectal bleeding under the care of Dr. Truett Perna and Dr. 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 Head CT did not show any acute change, due to her rectal cancer and history of rectal bleeding she was not a TPA candidate, after neurology saw the patient they recommended hospitalist admission.  CXR  Mild enlargement of cardiac silhouette. No acute abnormalities.  Failed RN swallow screen due to decreased level of alertness.  Daughter reports pt. recently "choked on a pill and has had difficutly swallowing ever since."    Assessment / Plan / Recommendation Clinical Impression  Pt. lethargic for assessment with daughter at bedside.  Verbally responsive with max verbal/tactile cues to maintain awake state; keeps eyes  closed.  Orally, observations include decreased manipulation delayed transit, right buccal cavity residue and decreased overall awareness.  Pharyngeal swallow elicited 60% of the time with suspected delayed initiation and weak laryngeal elevation.  Decreased alertness and awareness prevents pt. from oral intake presently.  SLP will return next date to re-assess abiltity to initiate po's versus objective assessement.     Aspiration Risk  Moderate    Diet Recommendation NPO        Other  Recommendations Oral Care Recommendations: Oral care BID   Follow Up Recommendations   (TBD)    Frequency and Duration min 2x/week  2 weeks   Pertinent Vitals/Pain none    SLP Swallow Goals Goal #3: Pt. will maintain alertness and initiate pharyngeal swallow consistently with min verbal cues to establish goals of care regarding po's.   Swallow Study         Oral/Motor/Sensory Function Overall Oral Motor/Sensory Function:  (intermittent response, will assess when incr LOA)   Ice Chips Ice chips: Impaired Presentation: Spoon Oral Phase Impairments: Reduced labial seal;Reduced lingual movement/coordination;Impaired anterior to posterior transit;Poor awareness of bolus Oral Phase Functional Implications: Oral holding Pharyngeal Phase Impairments:  (attempts to initiate, no swallow observed)   Thin Liquid Thin Liquid: Not tested    Nectar Thick Nectar Thick Liquid: Not tested   Honey Thick Honey Thick Liquid: Not tested   Puree Puree: Impaired (jello) Presentation: Spoon Oral Phase Impairments: Reduced labial seal;Reduced lingual movement/coordination;Poor awareness of bolus;Impaired anterior  to posterior transit Oral Phase Functional Implications: Oral holding;Oral residue Pharyngeal Phase Impairments: Suspected delayed Swallow;Decreased hyoid-laryngeal movement   Solid   GO    Solid: Not tested       Royce Macadamia M.Ed [employer] [phone]  09/22/2012

## 2012-09-22 NOTE — Progress Notes (Signed)
Physical Therapy Evaluation Patient Details Name: Rachel Benton MRN: [id number] DOB: 1940-06-02 Today's Date: 09/22/2012 Time: 1020-1057 PT Time Calculation (min): 37 min  PT Assessment / Plan / Recommendation History of Present Illness  Pt admit with R CVA.  Recently completed chemo and radiation for rectal CA.    Clinical Impression  Pt admitted with R CVA. Pt currently with functional limitations due to the deficits listed below (see PT Problem List). Assessment was difficult due to patient's lethargy.  Pt never opened her eyes and only followed command to squeeze her right hand.  Pt spoke to her daughter at end of session but still did not open eyes.  Feel that pt would be difficult to care for at current level at home and daughter is concerned as well as it had been difficult prior to admit.  Recommended short term NHP and daughter also concerned about that as they had a co-pay up front when pt went last time and they feel it will be difficult to pay that again.  Need SW to research and give daughters the options with their costs.   Pt will benefit from skilled PT to increase their independence and safety with mobility to allow discharge to the venue listed below.    PT Assessment  Patient needs continued PT services    Follow Up Recommendations  Other (comment) (TBA)                Equipment Recommendations  Other (comment) (Hospital bed with air mattress overlay, geomat cushion)         Frequency Min 3X/week    Precautions / Restrictions Precautions Precautions: Fall Restrictions Weight Bearing Restrictions: No   Pertinent Vitals/Pain VSS, No pain      Mobility  Bed Mobility Bed Mobility: Rolling Left;Left Sidelying to Sit;Sitting - Scoot to Edge of Bed Rolling Left: 1: +1 Total assist Left Sidelying to Sit: 1: +1 Total assist Sitting - Scoot to Edge of Bed: 1: +1 Total assist Details for Bed Mobility Assistance: Pt initiated movement slightly.  Used pad to assist pt  to EOB.   Transfers Transfers: Not assessed Ambulation/Gait Ambulation/Gait Assistance: Not tested (comment) Stairs: No Wheelchair Mobility Wheelchair Mobility: No Modified Rankin (Stroke Patients Only) Pre-Morbid Rankin Score: Moderately severe disability Modified Rankin: Severe disability         PT Diagnosis: Generalized weakness  PT Problem List: Decreased activity tolerance;Decreased strength;Decreased mobility;Decreased balance;Decreased knowledge of use of DME;Decreased safety awareness;Decreased knowledge of precautions PT Treatment Interventions: DME instruction;Gait training;Functional mobility training;Therapeutic activities;Therapeutic exercise;Balance training;Patient/family education     PT Goals(Current goals can be found in the care plan section) Acute Rehab PT Goals Patient Stated Goal: to go home PT Goal Formulation: With patient/family Time For Goal Achievement: 10/06/12 Potential to Achieve Goals: Good  Visit Information  Last PT Received On: 09/22/12 Assistance Needed: +2 History of Present Illness: Pt admit with R CVA.  Recently completed chemo and radiation for rectal CA.         Prior Functioning  Home Living Family/patient expects to be discharged to:: Private residence Living Arrangements: Children Available Help at Discharge: Family;Available 24 hours/day Type of Home: House Home Access: Ramped entrance Home Layout: One level Home Equipment: Shower seat;Tub bench;Bedside commode;Wheelchair - [employer] - 4 wheels Additional Comments: Daughter states that pt's three daughters have been caring for pt for some time now.  She states she did not ambulate much.  She states they have been using a computer chair to roll her  in house.  She states she just got her a wheelchair.   Prior Function Level of Independence: Needs assistance Gait / Transfers Assistance Needed: min to mod assist  ADL's / Homemaking Assistance Needed: mod assist Comments:  Daughter states that pt very stubborn at times.  Communication Communication: Other (comment) (mumbling- difficult to understand.)    Cognition  Cognition Arousal/Alertness: Lethargic Behavior During Therapy: Flat affect Overall Cognitive Status: History of cognitive impairments - at baseline    Extremity/Trunk Assessment Upper Extremity Assessment Upper Extremity Assessment: Defer to OT evaluation Lower Extremity Assessment Lower Extremity Assessment: RLE deficits/detail;LLE deficits/detail (difficult to assess due to lethargy) RLE Deficits / Details: grossly 3-/5 LLE Deficits / Details: grossly 2-/5 Cervical / Trunk Assessment Cervical / Trunk Assessment: Other exceptions;Kyphotic (keeps head flexed and rotated to right)   Balance Balance Balance Assessed: Yes Static Sitting Balance Static Sitting - Balance Support: Feet supported;Right upper extremity supported Static Sitting - Level of Assistance: 1: +1 Total assist Static Sitting - Comment/# of Minutes: Pt sat for 5 minutes needing total assist.  Pt not resisting movement however also not assisting with movement either.  Needed cues and assist for posture.  Pt would not do any movement to command.  Stretched pt's neck and able to get to neutral but pt could not maintain.  Tried to facilitate pt sitting without assist but pt was unable to do so even with facilitation.  Pt leaning too far to left.  Tried to lean on right elbow to facilitate upright posture once sitting up however pt continued to lean to left once upright.  Pt never opened eyes to command.    End of Session PT - End of Session Equipment Utilized During Treatment: Gait belt;Oxygen Activity Tolerance: Patient limited by fatigue;Patient limited by lethargy Patient left: in bed;with call bell/phone within reach;with family/visitor present Nurse Communication: Mobility status;Need for lift equipment       INGOLD,Sirenity Shew 09/22/2012, 11:44 AM [facility] [phone] (pager)

## 2012-09-22 NOTE — Evaluation (Signed)
Occupational Therapy Evaluation Patient Details Name: Rachel Benton MRN: [id number] DOB: 02/12/1941 Today's Date: 09/22/2012 Time: 1042-1100 OT Time Calculation (min): 18 min  OT Assessment / Plan / Recommendation History of present illness Pt admit with R CVA.  Recently completed chemo and radiation for rectal CA.     Clinical Impression   Pt presents with lethargy.  Did not open her eyes throughout session and required total assist for all mobility.  She is dependent in all ADL. Will follow acutely.  Daughter has concerns about cost of rehab.  See PT clinical impression statement, may need to be discharged home, although pt needs maximum rehab.    OT Assessment  Patient needs continued OT Services    Follow Up Recommendations   (TBD)    Barriers to Discharge   family is concerned with cost of SNF rehab  Equipment Recommendations  Hospital bed;Wheelchair (measurements OT);Wheelchair cushion (measurements OT) (if discharged home)    Recommendations for Other Services    Frequency  Min 3X/week    Precautions / Restrictions Precautions Precautions: Fall Restrictions Weight Bearing Restrictions: No   Pertinent Vitals/Pain No distress or pain.    ADL  Eating/Feeding: NPO Where Assessed - Eating/Feeding: Bed level Transfers/Ambulation Related to ADLs: non ambulatory ADL Comments: Pt requires total assist for all ADL and mobility, very lethargic.    OT Diagnosis: Generalized weakness;Cognitive deficits;Hemiplegia non-dominant side  OT Problem List: Decreased activity tolerance;Impaired balance (sitting and/or standing);Decreased strength;Decreased cognition;Decreased knowledge of use of DME or AE;Impaired UE functional use OT Treatment Interventions: Self-care/ADL training;Therapeutic activities;Cognitive remediation/compensation;Patient/family education;Balance training   OT Goals(Current goals can be found in the care plan section) Acute Rehab OT Goals Patient Stated Goal: to  go home OT Goal Formulation: Patient unable to participate in goal setting Time For Goal Achievement: 10/06/12 Potential to Achieve Goals: Fair ADL Goals Pt Will Perform Grooming: with mod assist;sitting (wash face/hands) Additional ADL Goal #1: Pt will perform supine to sit at EOB with mod assist in prep for ADL. Additional ADL Goal #2: Pt will sit EOB with min guard assist  x 10 minutes while engaged in functional activity.  Visit Information  Last OT Received On: 09/22/12 Assistance Needed: +2 PT/OT Co-Evaluation/Treatment: Yes History of Present Illness: Pt admit with R CVA.  Recently completed chemo and radiation for rectal CA.         Prior Functioning     Home Living Family/patient expects to be discharged to:: Private residence Living Arrangements: Children Available Help at Discharge: Family;Available 24 hours/day Type of Home: House Home Access: Ramped entrance Home Layout: One level Home Equipment: Shower seat;Tub bench;Bedside commode;Wheelchair - [employer] - 4 wheels Additional Comments: Daughter states that pt's three daughters have been caring for pt for some time now.  She states she did not ambulate much.  She states they have been using a computer chair to roll her in house.  She states she just got her a wheelchair.   Prior Function Level of Independence: Needs assistance Gait / Transfers Assistance Needed: min to mod assist  ADL's / Homemaking Assistance Needed: mod assist Comments: Daughter states that pt very stubborn at times.  Communication Communication: Other (comment) Dominant Hand: Right         Vision/Perception Vision - History Baseline Vision:  (did not open eyes) Vision - Assessment Vision Assessment: Vision not tested   Cognition  Cognition Arousal/Alertness: Lethargic Behavior During Therapy: Flat affect Overall Cognitive Status: History of cognitive impairments - at baseline  Extremity/Trunk Assessment Upper Extremity  Assessment Upper Extremity Assessment: LUE deficits/detail LUE Deficits / Details: 2/5 LUE:  (unable to fully assess due to lethargy) Lower Extremity Assessment Lower Extremity Assessment: RLE deficits/detail;LLE deficits/detail (difficult to assess due to lethargy) RLE Deficits / Details: grossly 3-/5 LLE Deficits / Details: grossly 2-/5 Cervical / Trunk Assessment Cervical / Trunk Assessment: Other exceptions;Kyphotic (keeps head flexed and rotated to right)     Mobility Bed Mobility Bed Mobility: Rolling Left;Left Sidelying to Sit;Sitting - Scoot to Edge of Bed Rolling Left: 1: +1 Total assist Left Sidelying to Sit: 1: +1 Total assist Sitting - Scoot to Edge of Bed: 1: +1 Total assist Details for Bed Mobility Assistance: Pt initiated movement slightly.  Used pad to assist pt to EOB.   Transfers Transfers: Not assessed     Exercise     Balance Balance Balance Assessed: Yes Static Sitting Balance Static Sitting - Balance Support: Feet supported;Right upper extremity supported Static Sitting - Level of Assistance: 1: +1 Total assist Static Sitting - Comment/# of Minutes: Pt sat for 5 minutes needing total assist.  Pt not resisting movement however also not assisting with movement either.  Needed cues and assist for posture.     End of Session OT - End of Session Activity Tolerance: Patient limited by lethargy Patient left: in bed;with call bell/phone within reach;with family/visitor present  GO     Evern Bio 09/22/2012, 12:12 PM [phone]

## 2012-09-22 NOTE — Progress Notes (Addendum)
Stroke Team Progress Note  HISTORY Rachel Benton is an 72 y.o. 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 Truett Perna. Patient currently is obtaining radiation and on oral Xeloda. Patient was at home today with family. At 12:10 she was brought to the bathroom. After her family members helped clean her, they were trying to pull up her pants when patient went limp, showed right gaze preference and not responding well. EMS was called and patient was brought to ED as a code stroke. Initial exam showed awake patient, able to count my fingers on the right, left facial droop, left arm and leg flaccidity. Initial CT head was negative for acute stole or bleed. It was discussed with patients family that tPA was a option, but family wanted to run is by Dr. Truett Perna first, given her rectal cancer. In speaking with Dr. Truett Perna, there was a concern she may be bleeding from her rectal cancer given her depressed hemoglobin/ Hematocrit. For this reason the decision to administer tPA was canceled.   After conversation with family patient with mRankin of 3.  Date last known well: 8.4.14  Time last known well: 12:10  tPA Given: No: possible rectal bleeding  SUBJECTIVE  Lying in bed. No new events.    OBJECTIVE Most recent Vital Signs: Filed Vitals:   09/21/12 2048 09/22/12 0039 09/22/12 0519 09/22/12 0721  BP: 138/66 152/83 156/79   Pulse: 98 96 99   Temp: 98.7 F (37.1 C) 98.9 F (37.2 C) 97.5 F (36.4 C) 99 F (37.2 C)  TempSrc: Oral Oral Oral Axillary  Resp: 16 17 19    SpO2: 100% 100% 99%    CBG (last 3)   Recent Labs  09/22/12 0037 09/22/12 0517 09/22/12 0717  GLUCAP 151* 173* 137*    IV Fluid Intake:   . 0.9 % NaCl with KCl 40 mEq / L 75 mL/hr at 09/22/12 1610    MEDICATIONS  . insulin aspart  0-9 Units Subcutaneous Q4H  . simvastatin  40 mg Oral q1800   PRN:  ALPRAZolam, hydrALAZINE, senna-docusate  Diet:  NPO   Activity:  Out of bed with  assistance DVT Prophylaxis:  SCD  CLINICALLY SIGNIFICANT STUDIES Basic Metabolic Panel:  Recent Labs Lab 09/21/12 1332 09/21/12 1343  NA 130* 131*  K 3.4* 3.4*  CL 92* 93*  CO2 25  --   GLUCOSE 263* 256*  BUN 8 6  CREATININE 0.49* 0.60  CALCIUM 8.3*  --    Liver Function Tests:  Recent Labs Lab 09/21/12 1332  AST 14  ALT 8  ALKPHOS 230*  BILITOT 0.8  PROT 5.5*  ALBUMIN 2.2*   CBC:  Recent Labs Lab 09/17/12 1507  09/21/12 1332 09/21/12 1343 09/22/12 0440  WBC 4.0  --  4.0  --  3.3*  NEUTROABS 3.3  --  3.3  --   --   HGB 10.0*  < > 9.8* 9.9* 8.5*  HCT 31.8*  < > 29.9* 29.0* 26.1*  MCV 79.1*  --  80.6  --  80.6  PLT 104 confirmed both analyzers*  --  95*  --  89*  < > = values in this interval not displayed. Coagulation:  Recent Labs Lab 09/21/12 1332 09/21/12 2205  LABPROT 16.3* 15.7*  INR 1.34 1.28   Cardiac Enzymes:  Recent Labs Lab 09/21/12 1332  TROPONINI <0.30   Urinalysis:  Recent Labs Lab 09/21/12 1628  COLORURINE AMBER*  LABSPEC 1.019  PHURINE 5.5  GLUCOSEU  250*  HGBUR NEGATIVE  BILIRUBINUR MODERATE*  KETONESUR 15*  PROTEINUR 30*  UROBILINOGEN 1.0  NITRITE NEGATIVE  LEUKOCYTESUR MODERATE*   Lipid Panel    Component Value Date/Time   CHOL 142 09/22/2012 0440   TRIG 163* 09/22/2012 0440   HDL 36* 09/22/2012 0440   CHOLHDL 3.9 09/22/2012 0440   VLDL 33 09/22/2012 0440   LDLCALC 73 09/22/2012 0440   HgbA1C  Lab Results  Component Value Date   HGBA1C 8.2* 08/15/2009    Urine Drug Screen:     Component Value Date/Time   LABOPIA NONE DETECTED 09/21/2012 1628   COCAINSCRNUR NONE DETECTED 09/21/2012 1628   LABBENZ POSITIVE* 09/21/2012 1628   AMPHETMU NONE DETECTED 09/21/2012 1628   THCU NONE DETECTED 09/21/2012 1628   LABBARB NONE DETECTED 09/21/2012 1628    Alcohol Level:  Recent Labs Lab 09/21/12 1332  ETH <11    Ct Head Wo Contrast 09/21/2012    No evidence of acute intracranial abnormality.  Remote right cerebellar and right thalamic  infarcts and mild chronic small vessel white matter ischemic changes.  Dg Chest Port 1 View 09/21/2012   Mild enlargement of cardiac silhouette. No acute abnormalities.Marland Kitchen   MRI of the brain    MRA of the brain    2D Echocardiogram  EF 60%. Left atrium normal in size.  Carotid Doppler  Preliminary report: Bilateral: 1-39% ICA stenosis. Vertebral artery flow is antegrade   CXR    EKG  .   Therapy Recommendations   Physical Exam    Elderly caucasian lady who is sleeping but can barely be aroused. . Afebrile. Head is nontraumatic. Neck is supple without bruit.. Cardiac exam no murmur or gallop. Lungs are clear to auscultation. Distal pulses are well felt.  Neurological Exam : Limited due to patient was sleeping and can be aroused only partially. Eyes are in primary position. Can follow gaze in all directions. Fundi were not visualized. Vision acuity and  fields cannot be tested. Face appears symmetric. Moves right side quite well purposefully against gravity. Mild left-sided weakness but can withdraw to touch and pain. Sensation, coordination cannot be reliably tested. Gait was not tested.    ASSESSMENT Rachel Benton is a 72 y.o. female presenting with left hemiparesis, right gaze preference, acute delirium. 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 MRIs pending Infarct felt to be embolic secondary to unknown source.  On no antithrombotics prior to admission. Patient remains NPO and has rectal cancer, so cannot take aspirin via either route. Patient with resultant left hemiparesis. Work up underway.   Hypertension  Diabetes mellitus, hgb a1c , 8.2 (goal < 6.5)  Hyperlipidemia, LDL 73 (goal <70)  Rectal cancer: cannot take any rectal medications  Dysphagia: remains NPO  Hospital day # 1  TREATMENT/PLAN  Remains NPO. Unable to give oral or rectal aspirin (due to rectal cancer/mass) within 24 hour of admission. At this point, patient does not have  further means of intake.  Risk factor modification  Await MRI   Gwendolyn Lima. Manson Passey, [facility], MBA, MHA [facility] Pager: [phone] 09/22/2012 4:58 PM  I have personally obtained a history, examined the patient, evaluated imaging results, and formulated the assessment and plan of care. I agree with the above. Delia Heady, MD

## 2012-09-22 NOTE — Progress Notes (Signed)
[facility]                                                                                Patient Demographics  Rachel Benton, is a 72 y.o. female, DOB - Sep 08, 1940, WUJ:[id number], [id number]:[id number]  Admit date - 09/21/2012  Admitting Physician Leroy Sea, MD  Outpatient Primary MD for the patient is Elby Showers, MD  LOS - 1   Chief Complaint  Patient presents with  . Code Stroke        Assessment & Plan    Brief summary  Rachel Benton is a 72 y.o. female, with recent diagnosis of rectal cancer with rectal bleeding under the care of Dr. Truett Perna and Dr. 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 Head CT did not show any acute change, due to her rectal cancer and history of rectal bleeding she was not a TPA candidate, after neurology saw the patient they recommended hospitalist admission on 09/21/2012 late in the evening.    Assessment and plan   1. 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    Stable on  telemetry so far, pending MRI MRA of the brain, carotid duplex, echogram, noted A1c and   lipid panel, we await evaluation by PT, OT  & speech therapy. Formal neurology follow up again today, suspect patient will require rehabilitation versus CIR placement upon discharge. I have discussed with the family that dysphagia might become an issue with the patient,  they will think about feeding tube if need arises.     Lab Results  Component Value Date   CHOL 142 09/22/2012   HDL 36* 09/22/2012   LDLCALC 73 09/22/2012   TRIG 163* 09/22/2012   CHOLHDL 3.9 09/22/2012    Lab Results  Component Value Date   HGBA1C 8.2* 08/15/2009        2. Diabetes mellitus type 2. Stable check A1c, every 4 hours sliding scale, hold oral hypoglycemics.    CBG (last 3)   Recent Labs  09/22/12 0037 09/22/12 0517 09/22/12 0717  GLUCAP 151* 173* 137*       3. Hypertension. Stable, will allow for permissive hypertension, when necessary IV hydralazine with written parameters.      4. Dyslipidemia continue  home dose statin once cleared for PO by speech.     5. Anxiety once taking by mouth low-dose oral benzodiazepines will be resumed.     6. Recent rectal cancer once stable outpatient followup with Dr. Truett Perna.     7. Hypokalemia and hyponatremia - improving after IV replacement, continue to monitor him gentle IV fluids with potassium supplementation.     8.UTI- empiric Rocephin, follow cultures results.     Code Status: Family in disagreement about CODE STATUS currently full  Family Communication: Daughter's bedside  Disposition Plan: SNF   Procedures CT scan brain, MRI MRA brain, echo gram, carotid duplex   Consults  Neuro, speech, PT OT   DVT Prophylaxis    SCDs    Lab Results  Component Value Date   PLT 89* 09/22/2012    Medications  Scheduled Meds: . aspirin EC  325 mg Oral Daily  . cefTRIAXone (ROCEPHIN)  IV  1 g Intravenous Q24H  . insulin aspart  0-9 Units  Subcutaneous Q4H  . simvastatin  40 mg Oral q1800   Continuous Infusions: . 0.9 % NaCl with KCl 40 mEq / L     PRN Meds:.ALPRAZolam, hydrALAZINE, senna-docusate  Antibiotics   Anti-infectives   Start     Dose/Rate Route Frequency Ordered Stop   09/22/12 1000  cefTRIAXone (ROCEPHIN) 1 g in dextrose 5 % 50 mL IVPB     1 g 100 mL/hr over 30 Minutes Intravenous Every 24 hours 09/22/12 0857 09/25/12 0959       Time Spent in minutes   35   Williams Dietrick K M.D on 09/22/2012 at 9:52 AM  Between 7am to 7pm - Pager - [phone]  After 7pm go to [email or web address] - password [email or web address]  And look for the night coverage person covering for me after hours  [facility] Office  [phone]    Subjective:   [name] today is in bed somnolent, will answer a few questions and mumble a few words, follows a few commands, however her responses and answers are very unreliable.  Objective:   Filed Vitals:   09/21/12 2048 09/22/12 0039 09/22/12 0519 09/22/12 0721  BP: 138/66 152/83 156/79   Pulse: 98 96 99   Temp: 98.7 F (37.1 C) 98.9 F (37.2 C) 97.5 F (36.4 C) 99 F (37.2 C)  TempSrc: Oral Oral Oral Axillary  Resp: 16 17 19    SpO2: 100% 100% 99%     Wt Readings from Last 3 Encounters:  09/15/12 63.322 kg (139 lb 9.6 oz)  09/08/12 64.139 kg (141 lb 6.4 oz)  09/08/12 64.139 kg (141 lb 6.4 oz)     Intake/Output Summary (Last 24 hours) at 09/22/12 0952 Last data filed at 09/22/12 0721  Gross per 24 hour  Intake    825 ml  Output      0 ml  Net    825 ml    Exam Somnolent, will open eyes and follow a few commands with right-sided movements, however unreliable in terms of her response, left-sided strength remains 3/5 with left-sided visual neglect [place].AT,PERRAL Supple Neck,No JVD, No cervical lymphadenopathy appriciated.  Symmetrical Chest wall movement, Good air movement bilaterally, CTAB RRR,No Gallops,Rubs or new Murmurs, No Parasternal Heave +ve B.Sounds, Abd  Soft, Non tender, No organomegaly appriciated, No rebound - guarding or rigidity. No Cyanosis, Clubbing or edema, No new Rash or bruise      Data Review   Micro Results Recent Results (from the past 240 hour(s))  MRSA PCR  SCREENING     Status: None   Collection Time    09/22/12  3:33 AM      Result Value Range Status   MRSA by PCR NEGATIVE  NEGATIVE Final   Comment:            The GeneXpert MRSA Assay (FDA     approved for NASAL specimens     only), is one component of a     comprehensive MRSA colonization     surveillance program. It is not     intended to diagnose MRSA     infection nor to guide or     monitor treatment for     MRSA infections.    Radiology Reports Ct Head Wo Contrast  09/21/2012   *RADIOLOGY REPORT*  Clinical Data: 72 year old female - code stroke - altered mental status.  CT HEAD WITHOUT CONTRAST  Technique:  Contiguous axial images were obtained from the base of the skull through the vertex without contrast.  Comparison: 06/15/2008 CT and MRI  Findings: Remote right cerebellar and right thalamic infarcts noted as well as mild chronic small vessel white matter ischemic changes.  No acute intracranial abnormalities are identified, including mass lesion or mass effect, hydrocephalus, extra-axial fluid collection, midline shift, hemorrhage, or acute infarction.  The visualized bony calvarium is unremarkable.  IMPRESSION: No evidence of acute intracranial abnormality.  Remote right cerebellar and right thalamic infarcts and mild chronic small vessel white matter ischemic changes.   Original Report Authenticated By: Harmon Pier, M.D.   Dg Chest Port 1 View  09/21/2012   *RADIOLOGY REPORT*  Clinical Data: Cough, weakness, question stroke  PORTABLE CHEST - 1 VIEW  Comparison: Portable exam 1627 hours compared to 06/15/2008  Findings: Enlargement of cardiac silhouette. Tortuous aorta with atherosclerotic calcification at arch. Mediastinal contours and pulmonary vascularity  normal. Lungs clear. No pleural effusion or pneumothorax. Bones demineralized.  IMPRESSION: Mild enlargement of cardiac silhouette. No acute abnormalities.   Original Report Authenticated By: Ulyses Southward, M.D.    CBC  Recent Labs Lab 09/17/12 1507 09/21/12 1332 09/21/12 1343 09/22/12 0440  WBC 4.0 4.0  --  3.3*  HGB 10.0* 9.8* 9.9* 8.5*  HCT 31.8* 29.9* 29.0* 26.1*  PLT 104 confirmed both analyzers* 95*  --  89*  MCV 79.1* 80.6  --  80.6  MCH 24.9* 26.4  --  26.2  MCHC 31.4* 32.8  --  32.6  RDW 30.4* 31.0*  --  31.2*  LYMPHSABS 0.3* 0.4*  --   --   MONOABS 0.2 0.3  --   --   EOSABS 0.1 0.0  --   --   BASOSABS 0.0 0.0  --   --     Chemistries   Recent Labs Lab 09/21/12 1332 09/21/12 1343 09/22/12 0745  NA 130* 131* 134*  K 3.4* 3.4* 3.8  CL 92* 93* 98  CO2 25  --  27  GLUCOSE 263* 256* 156*  BUN 8 6 6   CREATININE 0.49* 0.60 0.32*  CALCIUM 8.3*  --  8.3*  AST 14  --   --   ALT 8  --   --   ALKPHOS 230*  --   --   BILITOT 0.8  --   --    ------------------------------------------------------------------------------------------------------------------ CrCl is unknown because both a height and weight (above a minimum accepted value) are required for this calculation. ------------------------------------------------------------------------------------------------------------------ No results found for this basename: HGBA1C,  in the last 72 hours ------------------------------------------------------------------------------------------------------------------  Recent Labs  09/22/12  0440  CHOL 142  HDL 36*  LDLCALC 73  TRIG 086*  CHOLHDL 3.9   ------------------------------------------------------------------------------------------------------------------ No results found for this basename: TSH, T4TOTAL, FREET3, T3FREE, THYROIDAB,  in the last 72  hours ------------------------------------------------------------------------------------------------------------------ No results found for this basename: VITAMINB12, FOLATE, FERRITIN, TIBC, IRON, RETICCTPCT,  in the last 72 hours  Coagulation profile  Recent Labs Lab 09/21/12 1332 09/21/12 2205  INR 1.34 1.28    No results found for this basename: DDIMER,  in the last 72 hours  Cardiac Enzymes  Recent Labs Lab 09/21/12 1332  TROPONINI <0.30   ------------------------------------------------------------------------------------------------------------------ No components found with this basename: POCBNP,

## 2012-09-22 NOTE — Progress Notes (Signed)
IP PROGRESS NOTE  Subjective:   She is known to me with a history of rectal cancer. She was admitted yesterday with acute CVA. Ms. Armentor completed neoadjuvant radiation and Xeloda on 09/17/2012. She continues to have erythema at the groin and perineum per her family member at the bedside.  Objective: Vital signs in last 24 hours: Blood pressure 156/79, pulse 99, temperature 99 F (37.2 C), temperature source Axillary, resp. rate 19, SpO2 99.00%.  Intake/Output from previous day: 08/04 0701 - 08/05 0700 In: 825 [I.V.:825] Out: -   Physical Exam:  HEENT: No thrush or ulcers Lungs: Clear anteriorly, no respiratory distress Cardiac: Regular rhythm Abdomen: Nontender, no hepatomegaly Extremities: Pitting edema at the left greater than right lower leg and ankle Neurologic: Left hemiplegia/neglect, not speaking Skin: Erythema at the groin/pubic and perineum without skin breakdown, skin thickening at the soles. No palmar or plantar erythema.    Lab Results:  Recent Labs  09/21/12 1332 09/21/12 1343 09/22/12 0440  WBC 4.0  --  3.3*  HGB 9.8* 9.9* 8.5*  HCT 29.9* 29.0* 26.1*  PLT 95*  --  89*    BMET  Recent Labs  09/21/12 1332 09/21/12 1343  NA 130* 131*  K 3.4* 3.4*  CL 92* 93*  CO2 25  --   GLUCOSE 263* 256*  BUN 8 6  CREATININE 0.49* 0.60  CALCIUM 8.3*  --     Studies/Results: Ct Head Wo Contrast  09/21/2012   *RADIOLOGY REPORT*  Clinical Data: 72 year old female - code stroke - altered mental status.  CT HEAD WITHOUT CONTRAST  Technique:  Contiguous axial images were obtained from the base of the skull through the vertex without contrast.  Comparison: 06/15/2008 CT and MRI  Findings: Remote right cerebellar and right thalamic infarcts noted as well as mild chronic small vessel white matter ischemic changes.  No acute intracranial abnormalities are identified, including mass lesion or mass effect, hydrocephalus, extra-axial fluid collection, midline shift,  hemorrhage, or acute infarction.  The visualized bony calvarium is unremarkable.  IMPRESSION: No evidence of acute intracranial abnormality.  Remote right cerebellar and right thalamic infarcts and mild chronic small vessel white matter ischemic changes.   Original Report Authenticated By: Harmon Pier, M.D.   Dg Chest Port 1 View  09/21/2012   *RADIOLOGY REPORT*  Clinical Data: Cough, weakness, question stroke  PORTABLE CHEST - 1 VIEW  Comparison: Portable exam 1627 hours compared to 06/15/2008  Findings: Enlargement of cardiac silhouette. Tortuous aorta with atherosclerotic calcification at arch. Mediastinal contours and pulmonary vascularity normal. Lungs clear. No pleural effusion or pneumothorax. Bones demineralized.  IMPRESSION: Mild enlargement of cardiac silhouette. No acute abnormalities.   Original Report Authenticated By: Ulyses Southward, M.D.    Medications: I have reviewed the patient's current medications.  Assessment/Plan: 1. Locally advanced (clinically lymph node positive) rectal cancer. CT on 07/08/2012 consistent with a rectal mass and perirectal sigmoid mesocolon lymphadenopathy. No evidence of distant metastatic disease.  Radiation and concurrent Xeloda chemotherapy initiated 08/05/2012, completed 09/17/2012 2. Pain secondary to #1. Improved. 3. Diabetes. 4. Hypertension. 5. History of a CVA. 6. Microcytic anemia. Ferritin 93 on 08/31/2012. Secondary to history of rectal bleeding and recent chemotherapy/ 7. Skin toxicity related to radiation and Xeloda.  8. Thrombocytopenia secondary to chemotherapy/radiation 9. Admission with an acute CVA 09/21/2012  She has a history of locally advanced rectal cancer and has completed neoadjuvant therapy. She is admitted with acute CVA. Ms. Kroenke has multiple risk factors for stroke. The thrombocytopenia is likely  related to recent chemotherapy and radiation. I expect the platelet count improve over the next few weeks. I see no contraindication to  aspirin or Plavix with the platelets at this level.   Please call oncology as needed while she is admitted.     LOS: 1 day   Andreu Drudge, Jillyn Hidden  09/22/2012, 8:43 AM

## 2012-09-22 NOTE — Progress Notes (Signed)
  Echocardiogram 2D Echocardiogram has been performed.  Rachel Benton 09/22/2012, 1:07 PM

## 2012-09-22 NOTE — Progress Notes (Signed)
VASCULAR LAB PRELIMINARY  PRELIMINARY  PRELIMINARY  PRELIMINARY  Carotid duplex  completed.    Preliminary report:  Bilateral:  1-39% ICA stenosis.  Vertebral artery flow is antegrade.      Patsye Sullivant, RVT 09/22/2012, 12:20 PM

## 2012-09-22 NOTE — Progress Notes (Signed)
Pt unable to take PO medication due to NPO status and unable to swallow.

## 2012-09-23 ENCOUNTER — Inpatient Hospital Stay (HOSPITAL_COMMUNITY): Payer: Medicare Other

## 2012-09-23 LAB — CBC
HCT: 28.6 % — ABNORMAL LOW (ref 36.0–46.0)
Hemoglobin: 9.2 g/dL — ABNORMAL LOW (ref 12.0–15.0)
MCH: 26.1 pg (ref 26.0–34.0)
MCHC: 32.2 g/dL (ref 30.0–36.0)

## 2012-09-23 LAB — GLUCOSE, CAPILLARY
Glucose-Capillary: 155 mg/dL — ABNORMAL HIGH (ref 70–99)
Glucose-Capillary: 123 mg/dL — ABNORMAL HIGH (ref 70–99)
Glucose-Capillary: 142 mg/dL — ABNORMAL HIGH (ref 70–99)

## 2012-09-23 LAB — BASIC METABOLIC PANEL
BUN: 4 mg/dL — ABNORMAL LOW (ref 6–23)
Chloride: 96 mEq/L (ref 96–112)
Creatinine, Ser: 0.26 mg/dL — ABNORMAL LOW (ref 0.50–1.10)
GFR calc non Af Amer: >90 mL/min (ref >90)
Glucose, Bld: 136 mg/dL — ABNORMAL HIGH (ref 70–99)
Potassium: 3.9 mEq/L (ref 3.5–5.1)

## 2012-09-23 MED ORDER — BIOTENE DRY MOUTH MT LIQD
15.0000 mL | Freq: Two times a day (BID) | OROMUCOSAL | Status: DC
  Administered 2012-09-23 – 2012-10-01 (×16): 15 mL via OROMUCOSAL

## 2012-09-23 MED ORDER — JEVITY 1.2 CAL PO LIQD
1000.0000 mL | ORAL | Status: DC
  Administered 2012-09-23: 23:00:00
  Filled 2012-09-23 (×3): qty 1000

## 2012-09-23 MED ORDER — CHLORHEXIDINE GLUCONATE 0.12 % MT SOLN
15.0000 mL | Freq: Two times a day (BID) | OROMUCOSAL | Status: DC
  Administered 2012-09-23 – 2012-10-01 (×16): 15 mL via OROMUCOSAL
  Filled 2012-09-23 (×21): qty 15

## 2012-09-23 MED ORDER — ASPIRIN 325 MG PO TABS
325.0000 mg | ORAL_TABLET | Freq: Every day | ORAL | Status: DC
  Administered 2012-09-24 – 2012-10-01 (×7): 325 mg
  Filled 2012-09-23 (×10): qty 1

## 2012-09-23 MED ORDER — ALPRAZOLAM 0.25 MG PO TABS
0.2500 mg | ORAL_TABLET | Freq: Three times a day (TID) | ORAL | Status: DC | PRN
  Administered 2012-09-24 (×2): 0.25 mg
  Filled 2012-09-23 (×2): qty 1

## 2012-09-23 MED ORDER — SIMVASTATIN 40 MG PO TABS
40.0000 mg | ORAL_TABLET | Freq: Every day | ORAL | Status: DC
  Administered 2012-09-24 – 2012-10-01 (×7): 40 mg
  Filled 2012-09-23 (×9): qty 1

## 2012-09-23 NOTE — Progress Notes (Signed)
Speech Language Pathology Dysphagia Treatment Patient Details Name: Rachel Benton MRN: [id number] DOB: [birth year] Today's Date: 09/23/2012 Time:  -     Assessment / Plan / Recommendation Clinical Impression  Pt seen sitting edge of bed during cotreat with PT. Pt arousal improved, but still required moderate cues for responsiveness, eyes closed. Pt demonstrated initial awareness of POs with max tactile cues by SLP, but severe left oral weakness and poor oral control led to immediate evidence of aspriation with thin trials and holding of residuals with puree. Pt would not be able to safely consume PO until at least her arousal and responsiveness are much improved. Will not pursue objective testing until pt demosntrates more timely manipulation of boluses with swallow response. Discussed with dtr; suggest considering short term alternate nutrition for a few days of recovery.     Diet Recommendation  Continue with Current Diet: NPO    SLP Plan     Pertinent Vitals/Pain NA   Swallowing Goals  SLP Swallowing Goals Goal #3: Pt. will maintain alertness and initiate pharyngeal swallow consistently with min verbal cues to establish goals of care regarding po's. Swallow Study Goal #3 - Progress: Progressing toward goal  General Temperature Spikes Noted: No Respiratory Status: Room air Behavior/Cognition: Lethargic;Requires cueing;Cooperative Oral Cavity - Dentition: Dentures, top;Dentures, bottom Patient Positioning: Upright in bed (edge of bed with PT assist. )  Oral Cavity - Oral Hygiene Does patient have any of the following "at risk" factors?: Other - dysphagia Patient is HIGH RISK - Oral Care Protocol followed (see row info): Yes   Dysphagia Treatment Treatment focused on: Upgraded PO texture trials;Patient/family/caregiver education;Facilitation of oral phase Family/Caregiver Educated: daughter Treatment Methods/Modalities: Skilled observation Patient observed directly with PO's:  Yes Type of PO's observed: Dysphagia 1 (puree);Thin liquids Feeding: Total assist Liquids provided via: Cup Oral Phase Signs & Symptoms: Anterior loss/spillage (moderate oral residue) Pharyngeal Phase Signs & Symptoms: Suspected delayed swallow initiation;Immediate cough Type of cueing: Verbal;Tactile Amount of cueing: Maximal   GO    Harlon Ditty, MA CCC-SLP [phone]  Claudine Mouton 09/23/2012, 11:49 AM

## 2012-09-23 NOTE — Progress Notes (Signed)
Stroke Team Progress Note  HISTORY Rachel Benton is an 72 y.o. 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 Truett Perna. Patient currently is obtaining radiation and on oral Xeloda. Patient was at home today 09/21/2012 with family. At 12:10 she was brought to the bathroom. After her family members helped clean her, they were trying to pull up her pants when patient went limp, showed right gaze preference and not responding well. EMS was called and patient was brought to ED as a code stroke. Initial exam showed awake patient, able to count my fingers on the right, left facial droop, left arm and leg flaccidity. Initial CT head was negative for acute stroke or bleed. It was discussed with patients family that tPA was a option, but family wanted to run is by Dr. Truett Perna first, given her rectal cancer. In speaking with Dr. Truett Perna, there was a concern she may be bleeding from her rectal cancer given her depressed hemoglobin/ Hematocrit. For this reason the decision to administer tPA was canceled. After conversation with family patient with mRankin of 3. She was admitted for further evaluation.  SUBJECTIVE Daughter at bedside.  OBJECTIVE Most recent Vital Signs: Filed Vitals:   09/22/12 2355 09/23/12 0355 09/23/12 0734 09/23/12 0735  BP: 135/71 153/74 159/74   Pulse: 95 102 108   Temp: 98.7 F (37.1 C) 98.5 F (36.9 C)  98.7 F (37.1 C)  TempSrc: Axillary Oral  Axillary  Resp: 17 20 21    Height:      Weight:      SpO2: 100% 97% 97%    CBG (last 3)   Recent Labs  09/23/12 0008 09/23/12 0355 09/23/12 0733  GLUCAP 146* 123* 142*   IV Fluid Intake:   . 0.9 % NaCl with KCl 40 mEq / L 75 mL/hr at 09/22/12 2044   MEDICATIONS  . antiseptic oral rinse  15 mL Mouth Rinse q12n4p  . aspirin EC  325 mg Oral Daily  . cefTRIAXone (ROCEPHIN)  IV  1 g Intravenous Q24H  . chlorhexidine  15 mL Mouth Rinse BID  . insulin aspart  0-9 Units Subcutaneous Q4H  .  simvastatin  40 mg Oral q1800   PRN:  ALPRAZolam, hydrALAZINE, senna-docusate  Diet:  NPO   Activity:  Out of bed with assistance DVT Prophylaxis:  SCD  CLINICALLY SIGNIFICANT STUDIES Basic Metabolic Panel:   Recent Labs Lab 09/22/12 0745 09/23/12 0534  NA 134* 132*  K 3.8 3.9  CL 98 96  CO2 27 25  GLUCOSE 156* 136*  BUN 6 4*  CREATININE 0.32* 0.26*  CALCIUM 8.3* 8.3*   Liver Function Tests:   Recent Labs Lab 09/21/12 1332  AST 14  ALT 8  ALKPHOS 230*  BILITOT 0.8  PROT 5.5*  ALBUMIN 2.2*   CBC:  Recent Labs Lab 09/17/12 1507  09/21/12 1332  09/22/12 0440 09/23/12 0534  WBC 4.0  < > 4.0  --  3.3* 3.9*  NEUTROABS 3.3  --  3.3  --   --   --   HGB 10.0*  --  9.8*  < > 8.5* 9.2*  HCT 31.8*  --  29.9*  < > 26.1* 28.6*  MCV 79.1*  < > 80.6  --  80.6 81.3  PLT 104 confirmed both analyzers*  < > 95*  --  89* 85*  < > = values in this interval not displayed. Coagulation:   Recent Labs Lab 09/21/12 1332 09/21/12 2205  LABPROT  16.3* 15.7*  INR 1.34 1.28   Cardiac Enzymes:   Recent Labs Lab 09/21/12 1332  TROPONINI <0.30   Urinalysis:   Recent Labs Lab 09/21/12 1628  COLORURINE AMBER*  LABSPEC 1.019  PHURINE 5.5  GLUCOSEU 250*  HGBUR NEGATIVE  BILIRUBINUR MODERATE*  KETONESUR 15*  PROTEINUR 30*  UROBILINOGEN 1.0  NITRITE NEGATIVE  LEUKOCYTESUR MODERATE*   Lipid Panel    Component Value Date/Time   CHOL 142 09/22/2012 0440   TRIG 163* 09/22/2012 0440   HDL 36* 09/22/2012 0440   CHOLHDL 3.9 09/22/2012 0440   VLDL 33 09/22/2012 0440   LDLCALC 73 09/22/2012 0440   HgbA1C  Lab Results  Component Value Date   HGBA1C 8.1* 09/22/2012    Urine Drug Screen:     Component Value Date/Time   LABOPIA NONE DETECTED 09/21/2012 1628   COCAINSCRNUR NONE DETECTED 09/21/2012 1628   LABBENZ POSITIVE* 09/21/2012 1628   AMPHETMU NONE DETECTED 09/21/2012 1628   THCU NONE DETECTED 09/21/2012 1628   LABBARB NONE DETECTED 09/21/2012 1628    Alcohol Level:   Recent  Labs Lab 09/21/12 1332  ETH <11    Ct Head Wo Contrast  09/21/2012    No evidence of acute intracranial abnormality.  Remote right cerebellar and right thalamic infarcts and mild chronic small vessel white matter ischemic changes.  Dg Chest Port 1 View  09/21/2012   Mild enlargement of cardiac silhouette. No acute abnormalities.Marland Kitchen   MRI of the brain  09/22/2012  1.  Moderate sized confluent right MCA infarct with cytotoxic edema.   No associated hemorrhage or mass effect at this time. 2.  Possible superimposed acute to subacute lacunar infarct in the posterior left MCA territory. 3.  Otherwise, stable brain with expected evolution of the 2010 right thalamus lacunar infarct. 4.  Bone marrow signal has become abnormal since 2010.  This could be related to anemia due to GI bleeding or cancer therapy. Metastatic disease to bone is less likely. 5.  Mild mastoid effusions.    MRA of the brain  09/22/2012   1.  New right MCA occlusion just beyond its origin. 2.  Otherwise stable intracranial MRA including proximal right PCA tandem stenoses and distal left PCA stenosis.   2D Echocardiogram  EF 55-60% with no source of embolus. No falvular abnormalities.  Carotid Doppler  Preliminary report: Bilateral: 1-39% ICA stenosis. Vertebral artery flow is antegrade  CXR  09/21/2012 Mild enlargement of cardiac silhouette. No acute abnormalities.  EKG  .   Therapy Recommendations TBD - likely SNF based on family situation  Physical Exam   Elderly caucasian lady who is sleeping but can barely be aroused. . Afebrile. Head is nontraumatic. Neck is supple without bruit.. Cardiac exam no murmur or gallop. Lungs are clear to auscultation. Distal pulses are well felt.  Neurological Exam : Limited due to patient was sleeping and can be aroused only partially. Eyes are in primary position. Can follow gaze in all directions. Fundi were not visualized. Vision acuity and fields cannot be tested. Face appears symmetric. Moves right  side quite well purposefully against gravity. Mild left-sided weakness but can withdraw to touch and pain. Sensation, coordination cannot be reliably tested. Gait was not tested.   ASSESSMENT Rachel Benton is a 72 y.o. female presenting with left hemiparesis, right gaze preference, acute delirium. Imaging confirms confluent right MCA infarct with cytotoxic cerebral edema and acute to subacute lacunar infarct in the posterior left MCA territory (right cerebellar and right thalamic  infarcts). 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  On no antithrombotics prior to admission. Now ordered aspirin 325mg  po daily for secondary stroke prevention. Patient with resultant lethargy, left hemiparesis, dysphagia. Work up underway.   Dysphagia, failed swallow eval, NPO  Hypertension  Diabetes mellitus, hgb a1c , 8.2 (goal < 6.5)  Hyperlipidemia, LDL 73 (goal <70)  Rectal cancer, advanced, followed by Dr Truett Perna, per him with likely rectal bleeding given low Hgb, just finished chemo and radiation last Thursday. Plans for surgery to remove tumor post chemo/radiation  Hx stroke 2009, placed on aspirin, good recovery. Taken off at time of tumor diagnosis.  Hx recent fall  Hospital day # 2  TREATMENT/PLAN  Unable to administer antithrombotic within 24h of admission. Given rectal cancer, would not recommend rectal administration of aspirin. Unable to take PO as unable to swallow. Will consider aspirin per tube should one be placed or po if able to swallow - discussion with family related to prognosis, etc.   ST to assess swallow  No indication for step down from neuro standpoint. Will defer to medical MD,  D/W daughter and answered questions   Annie Main, MSN, RN, ANVP-BC, ANP-BC, [facility] Pager: [phone] 09/23/2012 9:38 AM  I have personally obtained a history,  examined the patient, evaluated imaging results, and formulated the assessment and plan of care. I agree with the above. Delia Heady, MD

## 2012-09-23 NOTE — Evaluation (Signed)
Speech Language Pathology Evaluation Patient Details Name: Rachel Benton MRN: [id number] DOB: 08/01/1940 Today's Date: 09/23/2012 Time:  -     Problem List:  Patient Active Problem List   Diagnosis Date Noted  . Protein-calorie malnutrition, severe 09/22/2012  . Cancer   . Rectal cancer metastasized to intrapelvic lymph node 07/22/2012  . STRESS REACTION, ACUTE, WITH EMOTIONAL DISTURBANCE 11/01/2009  . ANXIETY, MILD 06/24/2008  . HIP FRACTURE 05/06/2008  . HYPERLIPIDEMIA 11/17/2006  . HYPERTENSION 11/17/2006  . BACK PAIN, CHRONIC 11/17/2006  . CEREBROVASCULAR ACCIDENT, HX OF 11/17/2006  . DIABETES MELLITUS, TYPE II 06/17/2006  . ADVEF, DRUG/MEDICINAL/BIOLOGICAL SUBST NOS 06/17/2006   Past Medical History:  Past Medical History  Diagnosis Date  . Hypertension   . Diabetes mellitus without complication   . Hyperlipidemia   . Back pain   . Insomnia   . Hyperlipidemia   . Stroke 2009  . Hemorrhoids   . History of recent fall 07/05/12  . Cancer 07/21/12    rectal, lymphadenopathy   Past Surgical History:  Past Surgical History  Procedure Laterality Date  . Hip arthroplasty Left 2009    fall injury  . Abdominal hysterectomy      TAH, > 37 yrs ago  . Bladder suspension      many yrs ago   HPI:  72 y.o. female, with recent diagnosis of rectal cancer with rectal bleeding under the care of Dr. Truett Perna and Dr. 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 Head CT did not show any acute change, due to her rectal cancer and history of rectal bleeding she was not a TPA  candidate, after neurology saw the patient they recommended hospitalist admission.  CXR  Mild enlargement of cardiac silhouette. No acute abnormalities.  Failed RN swallow screen due to decreased level of alertness.  Daughter reports pt. recently "choked on a pill and has had difficutly swallowing ever since."    Assessment / Plan / Recommendation Clinical Impression  SLP cotreated with PT to maximize arousal for pariticpation in PO trials and cognitive linguistic eval. Sitting edge of bed pt was able to follow basic proximal and distal commands with right UE with 75% accuracy and sustain attention to basic functional tasks (wiping face, swabbing mouth) for approx 15 seconds. Pt also correctly responded to basic Y/N questions with 100% accuracy. Despite improvements in cognition at edge of bed, pt remained lethargic with intermittent responsiveness and profound left neglect. Her eyes remained closed; SLP opened her eyes with total assist and pt focused on speaker, but was unable to track to midline with max cues. Agree that pt will need max assist with care, SNF recommended. SLP will continue treatment for functional cognition and communcation.See treatment note regarding PO trials.     SLP Assessment  Patient needs continued Speech Lanaguage Pathology Services    Follow Up Recommendations  Skilled Nursing facility    Frequency and Duration min 2x/week  2 weeks   Pertinent Vitals/Pain NA   SLP Goals  SLP Goals Potential to Achieve Goals: Good Progress/Goals/Alternative treatment plan discussed with pt/caregiver and they: Agree  SLP Goal #1: Pt will verbalize basic wants and needs in better than 80% of opportunities with max contextual and verbal cues.  SLP Goal #1 - Progress: Progressing toward goal SLP Goal #2: Pt will sustain attention to basic functional tasks for 1 minute with max contextual cues.  SLP Goal #2 - Progress: Progressing toward goal  SLP Evaluation Prior Functioning   Cognitive/Linguistic Baseline: Baseline deficits Type of Home: House  Lives With: Family Available Help at Discharge: Family;Available 24 hours/day Vocation: Retired   IT consultant  Overall Cognitive Status: History of cognitive impairments - at baseline Arousal/Alertness: Lethargic Orientation Level: Oriented to person;Oriented to place Attention: Focused;Sustained Focused Attention: Impaired Focused Attention Impairment: Verbal basic Sustained Attention: Impaired Sustained Attention Impairment: Verbal basic;Functional complex Memory:  (UTA) Awareness: Impaired Awareness Impairment: Intellectual impairment;Emergent impairment Problem Solving: Impaired Problem Solving Impairment: Verbal basic;Functional basic    Comprehension  Auditory Comprehension Overall Auditory Comprehension: Impaired Yes/No Questions: Impaired Basic Biographical Questions: 76-100% accurate Basic Immediate Environment Questions: 75-100% accurate Commands: Impaired One Step Basic Commands: 50-74% accurate    Expression Verbal Expression Overall Verbal Expression: Impaired Initiation: Impaired Automatic Speech: Social Response Level of Generative/Spontaneous Verbalization: Word Repetition: Impaired Level of Impairment: Word level Naming: Not tested Pragmatics: Impairment Impairments: Eye contact Interfering Components: Attention;Speech intelligibility   Oral / Motor Oral Motor/Sensory Function Overall Oral Motor/Sensory Function: Impaired Labial ROM: Reduced left Labial Symmetry: Abnormal symmetry left Labial Strength: Reduced Labial Sensation: Reduced Lingual ROM: Reduced left Lingual Symmetry: Abnormal symmetry left Lingual Strength: Reduced Lingual Sensation: Reduced Facial ROM: Reduced left Facial Symmetry: Left droop Facial Strength: Reduced Facial Sensation: Reduced Motor Speech Overall Motor Speech: Impaired Respiration: Within functional limits Phonation: Normal Resonance: Within  functional limits Articulation: Impaired Level of Impairment: Word Intelligibility: Intelligibility reduced Word: 50-74% accurate Phrase: 25-49% accurate Sentence: Not tested Conversation: Not tested Motor Planning: Witnin functional limits Motor Speech Errors: Consistent   GO    [employer], MA CCC-SLP [phone]  Claudine Mouton 09/23/2012, 11:38 AM

## 2012-09-23 NOTE — Progress Notes (Signed)
Physical Therapy Treatment Patient Details Name: Rachel Benton MRN: [id number] DOB: [birth year] Today's Date: 09/23/2012 Time: 9563-8756 PT Time Calculation (min): 32 min  PT Assessment / Plan / Recommendation  History of Present Illness Pt admit with R CVA.  Recently completed chemo and radiation for rectal CA.     PT Comments   Pt admitted with R CVA. Pt currently with functional limitations due to lethargy, poor cognition and weakness.  Pt still without much effort to sit however could be due to her right hemibody preference and weakness.   Pt will benefit from skilled PT to increase their independence and safety with mobility to allow discharge to the venue listed below.   Follow Up Recommendations  Other (comment) (TBA)                 Equipment Recommendations  Other (comment) (Hospital bed with air mattress overlay, geomat cushion)        Frequency Min 3X/week   Progress towards PT Goals Progress towards PT goals: Progressing toward goals  Plan Current plan remains appropriate    Precautions / Restrictions Precautions Precautions: Fall Restrictions Weight Bearing Restrictions: No   Pertinent Vitals/Pain VSS, No pain    Mobility  Bed Mobility Bed Mobility: Rolling Left;Left Sidelying to Sit;Sitting - Scoot to Edge of Bed Rolling Left: 1: +1 Total assist Left Sidelying to Sit: 1: +1 Total assist Sitting - Scoot to Edge of Bed: 1: +1 Total assist Details for Bed Mobility Assistance: Pt initiated movement slightly.  Used pad to assist pt to EOB.   Transfers Transfers: Not assessed Ambulation/Gait Ambulation/Gait Assistance: Not tested (comment) Stairs: No Wheelchair Mobility Wheelchair Mobility: No Modified Rankin (Stroke Patients Only) Pre-Morbid Rankin Score: Moderately severe disability Modified Rankin: Severe disability    Exercises General Exercises - Lower Extremity Long Arc Quad: AAROM;Right;5 reps;Seated    PT Goals (current goals can now be found in  the care plan section)    Visit Information  Last PT Received On: 09/23/12 Assistance Needed: +2 History of Present Illness: Pt admit with R CVA.  Recently completed chemo and radiation for rectal CA.      Subjective Data  Subjective: "you are pretty" pt told ST.    Cognition  Cognition Arousal/Alertness: Lethargic Behavior During Therapy: Flat affect Overall Cognitive Status: History of cognitive impairments - at baseline    Balance  Balance Balance Assessed: Yes Static Sitting Balance Static Sitting - Balance Support: Feet supported;Right upper extremity supported Static Sitting - Level of Assistance: 1: +1 Total assist Static Sitting - Comment/# of Minutes: Pt sat for 15 min at EOB needing total assist throughout.  Co-rx with ST.  ST had pt wipe her face which pt initiated movement.  Pt also tried to hold a cup.  Pt responding to questions that ST asked ,  Pt would not open eyes to command however when ST held eye open noted right gaze preference with pt unable to track to left.  See ST for her full assessment of swallowing and cognition.  Pt continues to to have neck flexed to leftand leaning left as well.     End of Session PT - End of Session Equipment Utilized During Treatment: Gait belt;Oxygen Activity Tolerance: Patient limited by fatigue;Patient limited by lethargy Patient left: in bed;with call bell/phone within reach;with family/visitor present Nurse Communication: Mobility status;Need for lift equipment       INGOLD,Pierrette Scheu 09/23/2012, 12:46 PM  [facility] [phone] (pager)

## 2012-09-23 NOTE — Progress Notes (Addendum)
[facility] Progress Note [place] TEAM 1 - Stepdown/ICU TEAM   NKENGE SONNTAG ZOX:[id number] DOB: 1940-12-30 DOA: 09/21/2012 PCP: Elby Showers, MD  Brief narrative: 72 y.o. female, with recent diagnosis of rectal cancer with rectal bleeding under the care of Dr. Truett Perna and Dr. 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  She was brought to the ER, code stroke was called she was seen by Neurology. Head CT did not show any acute change.  Due to her rectal cancer and history of rectal bleeding she was not a TPA candidate.    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 and acute to subacute lacunar infarct in the posterior left MCA territory (right cerebellar and right thalamic infarcts) felt to be thromboembolic secondary to occluded right M1 noted on MRA - no signif stenosis on carotids - further w/u and tx as per Stroke Team   Dysphagia failed her bedside swallow screen - RN to place feeding tube today for short term tube feeding   DIABETES MELLITUS, TYPE II Well controlled at this time   Hypokalemia Resolved w/ replacement   Hyponatremia  Hydrate and follow  UTI Urine cx not obtained unfortunately - will complete 5 days of empiric tx and follow clinically   HYPERLIPIDEMIA On statin at home - resume once route secured   ANXIETY, MILD On chronic benzo as outpt - holding at this time due to sedation   HYPERTENSION BP within acceptable range at this time  Tachycardia Volume depletion v/s anxiety - consider benzo wthdrwl if persists - is not on BB as outpt  BACK PAIN, CHRONIC  Rectal cancer  metastasized to intrapelvic lymph node obtaining radiation and on oral Xeloda - outpatient followup with Dr. Truett Perna  Protein-calorie malnutrition, severe Tube feeding to begin today   Code Status: FULL Family Communication: spoke w/ two family members at bedside Disposition Plan: transfer to 4N - place NG tube for short term feeds - cont to monitor for improvement in mental status   Consultants: Neurology  Procedures: 8/5 - Cartotid dopplers - Bilateral: 1-39% ICA stenosis 8/5 - TTE - EF 55-60% - grade I DD - no obvious source of emboli  Antibiotics: Rocephin 8/5 >>  DVT prophylaxis: SCDs  HPI/Subjective: Pt is obtunded/unable to provide any history.   Objective: Blood pressure 159/74, pulse 108, temperature 98.7 F (37.1 C), temperature source Oral, resp. rate 21, height 4\' 9"  (1.448 m), weight 65.7 kg (144 lb 13.5 oz), SpO2 97.00%.  Intake/Output Summary (Last 24 hours) at 09/23/12 1253 Last data filed at 09/23/12 1200  Gross per 24 hour  Intake   1800 ml  Output      3 ml  Net   1797 ml     Exam: General: No acute respiratory distress Lungs: Clear to auscultation bilaterally without wheezes or crackles Cardiovascular: Tachycardic but regular rhythm without murmur gallop or rub normal S1 and S2 Abdomen: Nontender, nondistended, soft, bowel sounds positive, no rebound, no ascites, no appreciable mass Extremities: No significant cyanosis, clubbing, or edema bilateral lower extremities  Data Reviewed: Basic Metabolic Panel:  Recent Labs Lab 09/21/12 1332 09/21/12 1343 09/22/12 0745 09/23/12 0534  NA  130* 131* 134* 132*  K 3.4* 3.4* 3.8 3.9  CL 92* 93* 98 96  CO2 25  --  27 25  GLUCOSE 263* 256* 156* 136*  BUN 8 6 6  4*  CREATININE 0.49* 0.60 0.32* 0.26*  CALCIUM 8.3*  --  8.3* 8.3*   Liver Function Tests:  Recent Labs Lab 09/21/12 1332  AST 14  ALT 8  ALKPHOS 230*  BILITOT 0.8  PROT 5.5*  ALBUMIN 2.2*   CBC:  Recent Labs Lab  09/17/12 1507 09/21/12 1332 09/21/12 1343 09/22/12 0440 09/23/12 0534  WBC 4.0 4.0  --  3.3* 3.9*  NEUTROABS 3.3 3.3  --   --   --   HGB 10.0* 9.8* 9.9* 8.5* 9.2*  HCT 31.8* 29.9* 29.0* 26.1* 28.6*  MCV 79.1* 80.6  --  80.6 81.3  PLT 104 confirmed both analyzers* 95*  --  89* 85*   Cardiac Enzymes:  Recent Labs Lab 09/21/12 1332  TROPONINI <0.30   CBG:  Recent Labs Lab 09/22/12 2017 09/23/12 0008 09/23/12 0355 09/23/12 0733 09/23/12 1201  GLUCAP 155* 146* 123* 142* 142*    Recent Results (from the past 240 hour(s))  MRSA PCR SCREENING     Status: None   Collection Time    09/22/12  3:33 AM      Result Value Range Status   MRSA by PCR NEGATIVE  NEGATIVE Final   Comment:            The GeneXpert MRSA Assay (FDA     approved for NASAL specimens     only), is one component of a     comprehensive MRSA colonization     surveillance program. It is not     intended to diagnose MRSA     infection nor to guide or     monitor treatment for     MRSA infections.     Studies:  Recent x-ray studies have been reviewed in detail by the Attending Physician  Scheduled Meds:  Scheduled Meds: . antiseptic oral rinse  15 mL Mouth Rinse q12n4p  . aspirin EC  325 mg Oral Daily  . cefTRIAXone (ROCEPHIN)  IV  1 g Intravenous Q24H  . chlorhexidine  15 mL Mouth Rinse BID  . insulin aspart  0-9 Units Subcutaneous Q4H  . simvastatin  40 mg Oral q1800    Time spent on care of this patient:   [facility] T  [facility] Office  [phone] Pager - Text Page per Loretha Stapler as per below:  On-Call/Text Page:      [email or web address]      password [email or web address]  If 7PM-7AM, please contact night-coverage [email or web address] Password [email or web address] 09/23/2012, 12:53 PM   LOS: 2 days

## 2012-09-24 LAB — CBC
HCT: 32.8 % — ABNORMAL LOW (ref 36.0–46.0)
Hemoglobin: 10.6 g/dL — ABNORMAL LOW (ref 12.0–15.0)
MCH: 26.3 pg (ref 26.0–34.0)
MCHC: 32.3 g/dL (ref 30.0–36.0)

## 2012-09-24 LAB — GLUCOSE, CAPILLARY
Glucose-Capillary: 148 mg/dL — ABNORMAL HIGH (ref 70–99)
Glucose-Capillary: 186 mg/dL — ABNORMAL HIGH (ref 70–99)
Glucose-Capillary: 237 mg/dL — ABNORMAL HIGH (ref 70–99)
Glucose-Capillary: 247 mg/dL — ABNORMAL HIGH (ref 70–99)

## 2012-09-24 LAB — BASIC METABOLIC PANEL
BUN: 6 mg/dL (ref 6–23)
CO2: 22 mEq/L (ref 19–32)
Chloride: 96 mEq/L (ref 96–112)
Creatinine, Ser: 0.27 mg/dL — ABNORMAL LOW (ref 0.50–1.10)
GFR calc Af Amer: >90 mL/min (ref >90)

## 2012-09-24 MED ORDER — FREE WATER
100.0000 mL | Freq: Four times a day (QID) | Status: DC
  Administered 2012-09-24 – 2012-10-01 (×25): 100 mL

## 2012-09-24 MED ORDER — GLUCERNA 1.2 CAL PO LIQD
1000.0000 mL | ORAL | Status: DC
  Filled 2012-09-24 (×2): qty 1000

## 2012-09-24 MED ORDER — SODIUM CHLORIDE 0.9 % IV SOLN
INTRAVENOUS | Status: DC
  Administered 2012-09-24: 10 mL/h via INTRAVENOUS
  Administered 2012-09-25: 16:00:00 via INTRAVENOUS
  Administered 2012-09-29: 250 mL via INTRAVENOUS

## 2012-09-24 MED ORDER — FERROUS SULFATE 220 (44 FE) MG/5ML PO ELIX
220.0000 mg | ORAL_SOLUTION | Freq: Every day | ORAL | Status: DC
  Filled 2012-09-24: qty 5

## 2012-09-24 MED ORDER — FERROUS SULFATE 300 (60 FE) MG/5ML PO SYRP
300.0000 mg | ORAL_SOLUTION | Freq: Every day | ORAL | Status: DC
  Administered 2012-09-25: 300 mg via ORAL
  Filled 2012-09-24 (×2): qty 5

## 2012-09-24 MED ORDER — JEVITY 1.2 CAL PO LIQD
1000.0000 mL | ORAL | Status: DC
  Administered 2012-09-25 – 2012-09-26 (×2)
  Administered 2012-09-27 – 2012-09-28 (×2): 1000 mL
  Filled 2012-09-24 (×7): qty 1000

## 2012-09-24 NOTE — Progress Notes (Signed)
Right side droop

## 2012-09-24 NOTE — Progress Notes (Signed)
Speech Language Pathology Dysphagia Treatment Patient Details Name: Rachel Benton MRN: [id number] DOB: 09/29/1940 Today's Date: 09/24/2012 Time: 6578-4696 SLP Time Calculation (min): 23 min  Assessment / Plan / Recommendation Clinical Impression  Pt awake but intermittently responsive to SLP despite max interventions. Pt communicated at phrase level x3 to over 15 attempts. PO trials attempted, but pt not opning mouth to accept sips of nectar or ice water despite max tactile cues. Unclear if pt may be refusing. Discussed with dtr. Plan is to f/u monday and see if pt may be able to participate in MBS or initiate POs.     Diet Recommendation  Continue with Current Diet: NPO    SLP Plan Continue with current plan of care   Pertinent Vitals/Pain NA   Swallowing Goals  SLP Swallowing Goals Goal #3: Pt. will maintain alertness and initiate pharyngeal swallow consistently with min verbal cues to establish goals of care regarding po's. Swallow Study Goal #3 - Progress: Progressing toward goal  General    Oral Cavity - Oral Hygiene     Dysphagia Treatment Treatment focused on: Upgraded PO texture trials;Patient/family/caregiver education;Facilitation of oral phase Family/Caregiver Educated: daughter Treatment Methods/Modalities: Skilled observation Patient observed directly with PO's: Yes Type of PO's observed: Dysphagia 1 (puree);Nectar-thick liquids Feeding: Total assist Liquids provided via: Cup Oral Phase Signs & Symptoms: Anterior loss/spillage Type of cueing: Verbal;Tactile Amount of cueing: Maximal   GO    Harlon Ditty, MA CCC-SLP [phone]  Claudine Mouton 09/24/2012, 3:44 PM

## 2012-09-24 NOTE — Progress Notes (Signed)
CSW received referral. CSW and CSW Belenda Cruise Drinkard met with Pt and husband at the bedside. Pt's husband would like CSW to speak with the step-daughter for any decision making.   Assessment to follow after meeting with step-daughter for d/c planning and possible SNF search.   Leron Croak, LCSWA [facility].  [phone]

## 2012-09-24 NOTE — Progress Notes (Signed)
NUTRITION CONSULT/FOLLOW UP  DOCUMENTATION CODES Per approved criteria  -Severe malnutrition in the context of chronic illness -Obesity Unspecified   Intervention:    Advance Jevity 1.2 formula by 10 ml every 8 hours to goal rate of 55 ml/hr to provide 1584 total kcals, 73 gm protein, 1065 ml of free water  Monitor Mg, Phos, K+ RD to follow for nutrition care plan  Nutrition Dx:   Malnutrition related to cancer and cancer related treatments as evidenced by 7% weight loss x 1 month and estimated intake < 75% of her needs for > 1 month, ongoing  New Goal:   EN to meet > 90% of estimated nutrition needs, currently unmet  Monitor:   EN regimen & tolerance, PO diet advancement, weight, labs, I/O's  Assessment:   Patient with hx of locally advanced rectal cancer admitted with 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  Initial nutrition assessment completed 8/5.  Patient s/p bedside swallow evaluation 8/5; unable to safely take PO's at this time given decreased level of alertness/responsiveness.  NGT in place (tip in distal gastric region).  Jevity 1.2 formula started 8/6; currently infusing at 10 ml/hr.  RD consulted for EN management ---> will adjust orders and add Adult Tube Feeding Protocol.  Patient at risk for refeeding with EN advancement given malnutrition.  Height: Ht Readings from Last 1 Encounters:  09/22/12 4\' 9"  (1.448 m)    Weight Status:   Wt Readings from Last 1 Encounters:  09/22/12 144 lb 13.5 oz (65.7 kg)    Body mass index is 31.33 kg/(m^2).  Re-estimated needs:  Kcal: 1400-1600 Protein: 65-75 gm Fluid: >/= 1.5 L  Skin: reddened area on sacrum from chemo  Diet Order: NPO   Intake/Output Summary (Last 24 hours) at 09/24/12 1040 Last data filed at 09/24/12 0823  Gross per 24 hour  Intake 953.75 ml  Output      0 ml  Net 953.75 ml    Labs:   Recent Labs Lab 09/22/12 0745 09/23/12 0534 09/24/12 0500  NA 134* 132* 132*  K 3.8  3.9 3.8  CL 98 96 96  CO2 27 25 22   BUN 6 4* 6  CREATININE 0.32* 0.26* 0.27*  CALCIUM 8.3* 8.3* 8.3*  GLUCOSE 156* 136* 159*    CBG (last 3)   Recent Labs  09/23/12 2333 09/24/12 0428 09/24/12 1011  GLUCAP 132* 148* 195*    Scheduled Meds: . antiseptic oral rinse  15 mL Mouth Rinse q12n4p  . aspirin  325 mg Per Tube Daily  . cefTRIAXone (ROCEPHIN)  IV  1 g Intravenous Q24H  . chlorhexidine  15 mL Mouth Rinse BID  . feeding supplement (JEVITY 1.2 CAL)  1,000 mL Per Tube Q24H  . insulin aspart  0-9 Units Subcutaneous Q4H  . simvastatin  40 mg Per Tube q1800    Continuous Infusions:   Maureen Chatters, RD, LDN Pager #: [phone] After-Hours Pager #: [phone]

## 2012-09-24 NOTE — Progress Notes (Signed)
Stroke Team Progress Note  HISTORY Rachel Benton is an 72 y.o. 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 Truett Perna. Patient currently is obtaining radiation and on oral Xeloda. Patient was at home today 09/21/2012 with family. At 12:10 she was brought to the bathroom. After her family members helped clean her, they were trying to pull up her pants when patient went limp, showed right gaze preference and not responding well. EMS was called and patient was brought to ED as a code stroke. Initial exam showed awake patient, able to count my fingers on the right, left facial droop, left arm and leg flaccidity. Initial CT head was negative for acute stroke or bleed. It was discussed with patients family that tPA was a option, but family wanted to run is by Dr. Truett Perna first, given her rectal cancer. In speaking with Dr. Truett Perna, there was a concern she may be bleeding from her rectal cancer given her depressed hemoglobin/ Hematocrit. For this reason the decision to administer tPA was canceled. After conversation with family patient with mRankin of 3. She was admitted for further evaluation.  SUBJECTIVE Drowsy. Sleeping. Has feeding tube in place.  OBJECTIVE Most recent Vital Signs: Filed Vitals:   09/24/12 0600 09/24/12 0700 09/24/12 0822 09/24/12 0823  BP:   151/92   Pulse: 118 119 115   Temp:    97.7 F (36.5 C)  TempSrc:    Axillary  Resp: 25 25 24    Height:      Weight:      SpO2: 100% 99% 94%    CBG (last 3)   Recent Labs  09/23/12 1944 09/23/12 2333 09/24/12 0428  GLUCAP 136* 132* 148*   IV Fluid Intake:     MEDICATIONS  . antiseptic oral rinse  15 mL Mouth Rinse q12n4p  . aspirin  325 mg Per Tube Daily  . cefTRIAXone (ROCEPHIN)  IV  1 g Intravenous Q24H  . chlorhexidine  15 mL Mouth Rinse BID  . feeding supplement (JEVITY 1.2 CAL)  1,000 mL Per Tube Q24H  . insulin aspart  0-9 Units Subcutaneous Q4H  . simvastatin  40 mg Per Tube  q1800   PRN:  ALPRAZolam, hydrALAZINE  Diet:  NPO  , tube feedings Activity:  Out of bed with assistance DVT Prophylaxis:  SCD  CLINICALLY SIGNIFICANT STUDIES Basic Metabolic Panel:   Recent Labs Lab 09/23/12 0534 09/24/12 0500  NA 132* 132*  K 3.9 3.8  CL 96 96  CO2 25 22  GLUCOSE 136* 159*  BUN 4* 6  CREATININE 0.26* 0.27*  CALCIUM 8.3* 8.3*   Liver Function Tests:   Recent Labs Lab 09/21/12 1332  AST 14  ALT 8  ALKPHOS 230*  BILITOT 0.8  PROT 5.5*  ALBUMIN 2.2*   CBC:  Recent Labs Lab 09/17/12 1507 09/21/12 1332  09/23/12 0534 09/24/12 0500  WBC 4.0 4.0  < > 3.9* 4.1  NEUTROABS 3.3 3.3  --   --   --   HGB 10.0* 9.8*  < > 9.2* 10.6*  HCT 31.8* 29.9*  < > 28.6* 32.8*  MCV 79.1* 80.6  < > 81.3 81.4  PLT 104 confirmed both analyzers* 95*  < > 85* 87*  < > = values in this interval not displayed. Coagulation:   Recent Labs Lab 09/21/12 1332 09/21/12 2205  LABPROT 16.3* 15.7*  INR 1.34 1.28   Cardiac Enzymes:   Recent Labs Lab 09/21/12 1332  TROPONINI <0.30  Urinalysis:   Recent Labs Lab 09/21/12 1628  COLORURINE AMBER*  LABSPEC 1.019  PHURINE 5.5  GLUCOSEU 250*  HGBUR NEGATIVE  BILIRUBINUR MODERATE*  KETONESUR 15*  PROTEINUR 30*  UROBILINOGEN 1.0  NITRITE NEGATIVE  LEUKOCYTESUR MODERATE*   Lipid Panel    Component Value Date/Time   CHOL 142 09/22/2012 0440   TRIG 163* 09/22/2012 0440   HDL 36* 09/22/2012 0440   CHOLHDL 3.9 09/22/2012 0440   VLDL 33 09/22/2012 0440   LDLCALC 73 09/22/2012 0440   HgbA1C  Lab Results  Component Value Date   HGBA1C 8.1* 09/22/2012    Urine Drug Screen:     Component Value Date/Time   LABOPIA NONE DETECTED 09/21/2012 1628   COCAINSCRNUR NONE DETECTED 09/21/2012 1628   LABBENZ POSITIVE* 09/21/2012 1628   AMPHETMU NONE DETECTED 09/21/2012 1628   THCU NONE DETECTED 09/21/2012 1628   LABBARB NONE DETECTED 09/21/2012 1628    Alcohol Level:   Recent Labs Lab 09/21/12 1332  ETH <11    Ct Head Wo Contrast   09/21/2012    No evidence of acute intracranial abnormality.  Remote right cerebellar and right thalamic infarcts and mild chronic small vessel white matter ischemic changes.  Dg Chest Port 1 View  09/21/2012   Mild enlargement of cardiac silhouette. No acute abnormalities.Marland Kitchen   MRI of the brain  09/22/2012  1.  Moderate sized confluent right MCA infarct with cytotoxic edema.   No associated hemorrhage or mass effect at this time. 2.  Possible superimposed acute to subacute lacunar infarct in the posterior left MCA territory. 3.  Otherwise, stable brain with expected evolution of the 2010 right thalamus lacunar infarct. 4.  Bone marrow signal has become abnormal since 2010.  This could be related to anemia due to GI bleeding or cancer therapy. Metastatic disease to bone is less likely. 5.  Mild mastoid effusions.    MRA of the brain  09/22/2012   1.  New right MCA occlusion just beyond its origin. 2.  Otherwise stable intracranial MRA including proximal right PCA tandem stenoses and distal left PCA stenosis.   2D Echocardiogram  EF 55-60% with no source of embolus. No falvular abnormalities.  Carotid Doppler  Preliminary report: Bilateral: 1-39% ICA stenosis. Vertebral artery flow is antegrade  CXR  09/21/2012 Mild enlargement of cardiac silhouette. No acute abnormalities.  EKG  Sinus tach, PACs  Therapy Recommendations TBD - likely SNF based on family situation  Physical Exam   Elderly caucasian lady who is sleeping but can barely be aroused. . Afebrile. Head is nontraumatic. Neck is supple without bruit.. Cardiac exam no murmur or gallop. Lungs are clear to auscultation. Distal pulses are well felt.  Neurological Exam : Limited due to patient was sleeping and can be aroused only partially. Eyes are in primary position. Can follow gaze in all directions. Fundi were not visualized. Vision acuity and fields cannot be tested. Face appears symmetric. Moves right side quite well purposefully against gravity.  Mild left-sided weakness but can withdraw to touch and pain. Sensation, coordination cannot be reliably tested. Gait was not tested.   ASSESSMENT Ms. Rachel Benton is a 72 y.o. female presenting with left hemiparesis, right gaze preference, acute delirium. Imaging confirms confluent right MCA infarct with cytotoxic cerebral edema and acute to subacute lacunar infarct in the posterior left MCA territory (right cerebellar and right thalamic infarcts). 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  On no antithrombotics prior to admission. Now on aspirin 325mg  per tube daily for secondary stroke prevention. Patient with resultant lethargy, left hemiparesis, dysphagia. Work up completed.   Dysphagia, failed swallow eval, NPO, feeding tube placed with feeds started  Tachycardia, no afib seen per nurse, need to monitor  Hypertension  Diabetes mellitus, hgb a1c , 8.2 (goal < 6.5)  Hyperlipidemia, LDL 73 (goal <70)  Rectal cancer, advanced, followed by Dr Truett Perna, per him with likely rectal bleeding given low Hgb, just finished chemo and radiation last Thursday. Plans for surgery to remove tumor post chemo/radiation  Hx stroke 2009, placed on aspirin, good recovery. Taken off at time of tumor diagnosis.  Hx recent fall  Hospital day # 3  TREATMENT/PLAN  Continue aspirin per tube for secondary stroke prevention. Not a long term anticoagulant candidate, so will not do further embolic workup. Watch for afib (given increased HR) as additional possible cause of stroke  Will likely need PEG. Reasonable to give meds and feedings for now, evaluate for improvement. Family may want to consider options given medical co morbidities.  Given stroke, patient is at risk for recurrent stroke, extension of stroke during the perioperative period. This risk is minimized after 6 weeks. If surgery indicated prior to  that point, will need to weigh risk/benefits and discuss with patient and family.  No further stroke workup indicated. Patient has a 10-15% risk of having another stroke over the next year, the highest risk is within 2 weeks of the most recent stroke. Ongoing risk factor control  Stroke Service will sign off. Please call should any needs arise. Follow up with Dr. Pearlean Brownie, Stroke Clinic, in 2 months.   Annie Main, MSN, RN, ANVP-BC, ANP-BC, [facility] Pager: [phone] 09/24/2012 8:55 AM  I have personally obtained a history, examined the patient, evaluated imaging results, and formulated the assessment and plan of care. I agree with the above.  Delia Heady, MD

## 2012-09-24 NOTE — Progress Notes (Signed)
[facility] Progress Note  TEAM 1 - Stepdown/ICU TEAM   Rachel Benton ZOX:[id number] DOB: 1941-02-06 DOA: 09/21/2012 PCP: Rachel Showers, MD  Brief narrative: 72 y.o. female, with recent diagnosis of rectal cancer with rectal bleeding under the care of Dr. Truett Benton and Dr. Doylene Benton, who completed chemoradiation 4 days ago, CVA in the past affecting left sided extremities for which she had completely recovered, type 2 diabetes mellitus, dyslipidemia, hypertension who was in her usual state of health till about 4 hours prior to admit when she experienced sudden onset of left-sided weakness, facial droop and got limp, this was noticed by family members who were around her.  She was brought to the ER, code stroke was called she was seen by Neurology. Head CT did not show any acute change.  Due to her rectal cancer and history of rectal bleeding she was not a TPA candidate.    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 and acute to subacute lacunar infarct in the posterior left MCA territory (right cerebellar and right thalamic infarcts) felt to be thromboembolic secondary to occluded right M1 noted on MRA - no signif stenosis on carotids - further w/u and tx as per Stroke Team   Dysphagia failed her bedside swallow screen - RN to placed feeding tube 8/6 for short term tube feeding -add free water in am if able to get to goal rate  DIABETES MELLITUS, TYPE II controlled at this time but watch with addition of tube feeding  Hypokalemia Resolved w/ replacement   Hyponatremia  Hydrate and follow  UTI Urine cx not obtained unfortunately - will complete 5 days of empiric tx and follow clinically   HYPERLIPIDEMIA On statin at home - resume once route secured   ANXIETY, MILD On chronic benzo as outpt - resumed since increased anxiety but watch closely for over sedation  HYPERTENSION BP within acceptable range at this time  Tachycardia Volume depletion  v/s anxiety - consider benzo wthdrwl if persists - is not on BB as outpt  BACK PAIN, CHRONIC  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 Truett Benton  Protein-calorie malnutrition, severe Tube feeding to begin today   Code Status: FULL Family Communication: spoke w/ two family members at bedside Disposition Plan: transfer to 4N - place NG tube for short term feeds - cont to monitor for improvement in mental status   Consultants: Neurology  Procedures: 8/5 - Cartotid dopplers - Bilateral: 1-39% ICA stenosis 8/5 - TTE - EF 55-60% - grade I DD - no obvious source of emboli  Antibiotics: Rocephin 8/5 >>  DVT prophylaxis: SCDs  HPI/Subjective: Pt awake and able to verbalize some responses but still quite difficult to understand. Seems anxious and fidgety.   Objective: Blood pressure 151/92, pulse 115, temperature 97.8 F (36.6 C), temperature source Axillary, resp. rate 24, height 4\' 9"  (1.448 m), weight 65.7 kg (144 lb 13.5 oz), SpO2 94.00%.  Intake/Output Summary (Last 24 hours) at 09/24/12 1435 Last data filed at 09/24/12 1150  Gross per 24 hour  Intake 916.17 ml  Output      2 ml  Net 914.17 ml     Exam: General: No acute respiratory distress Lungs: Clear to auscultation bilaterally without wheezes or crackles Cardiovascular: Tachycardic but regular rhythm without murmur gallop or rub normal S1 and S2 Abdomen: Nontender, nondistended, soft, bowel sounds positive, no rebound, no ascites, no appreciable mass-PANDA tube in place w/ TF  infusing Extremities: No significant cyanosis, clubbing, or edema bilateral lower extremities  Data Reviewed: Basic Metabolic Panel:  Recent Labs Lab 09/21/12 1332 09/21/12 1343 09/22/12 0745 09/23/12 0534 09/24/12 0500  NA 130* 131* 134* 132* 132*  K 3.4* 3.4* 3.8 3.9 3.8  CL 92* 93* 98 96 96  CO2 25  --  27 25 22   GLUCOSE 263* 256* 156* 136* 159*   BUN 8 6 6  4* 6  CREATININE 0.49* 0.60 0.32* 0.26* 0.27*  CALCIUM 8.3*  --  8.3* 8.3* 8.3*   Liver Function Tests:  Recent Labs Lab 09/21/12 1332  AST 14  ALT 8  ALKPHOS 230*  BILITOT 0.8  PROT 5.5*  ALBUMIN 2.2*   CBC:  Recent Labs Lab 09/17/12 1507 09/21/12 1332 09/21/12 1343 09/22/12 0440 09/23/12 0534 09/24/12 0500  WBC 4.0 4.0  --  3.3* 3.9* 4.1  NEUTROABS 3.3 3.3  --   --   --   --   HGB 10.0* 9.8* 9.9* 8.5* 9.2* 10.6*  HCT 31.8* 29.9* 29.0* 26.1* 28.6* 32.8*  MCV 79.1* 80.6  --  80.6 81.3 81.4  PLT 104 confirmed both analyzers* 95*  --  89* 85* 87*   Cardiac Enzymes:  Recent Labs Lab 09/21/12 1332  TROPONINI <0.30   CBG:  Recent Labs Lab 09/23/12 1944 09/23/12 2333 09/24/12 0428 09/24/12 1011 09/24/12 1141  GLUCAP 136* 132* 148* 195* 186*    Recent Results (from the past 240 hour(s))  MRSA PCR SCREENING     Status: None   Collection Time    09/22/12  3:33 AM      Result Value Range Status   MRSA by PCR NEGATIVE  NEGATIVE Final   Comment:            The GeneXpert MRSA Assay (FDA     approved for NASAL specimens     only), is one component of a     comprehensive MRSA colonization     surveillance program. It is not     intended to diagnose MRSA     infection nor to guide or     monitor treatment for     MRSA infections.     Studies:  Recent x-ray studies have been reviewed in detail by the Attending Physician  Scheduled Meds:  Scheduled Meds: . antiseptic oral rinse  15 mL Mouth Rinse q12n4p  . aspirin  325 mg Per Tube Daily  . chlorhexidine  15 mL Mouth Rinse BID  . free water  100 mL Per Tube Q6H  . insulin aspart  0-9 Units Subcutaneous Q4H  . simvastatin  40 mg Per Tube q1800    Time spent on care of this patient:   Benton,Rachel L. ANP  [facility] Office  [phone] Pager - Text Page per Rachel Benton as per below:  On-Call/Text Page:      [email or web address]      password [email or web address]  If 7PM-7AM, please contact  night-coverage [email or web address] Password [email or web address] 09/24/2012, 2:35 PM   LOS: 3 days      I have examined the patient, reviewed the chart and modified the above note which I agree with.   Rachel Lovins,MD [phone] 09/24/2012, 6:46 PM

## 2012-09-25 LAB — GLUCOSE, CAPILLARY
Glucose-Capillary: 257 mg/dL — ABNORMAL HIGH (ref 70–99)
Glucose-Capillary: 263 mg/dL — ABNORMAL HIGH (ref 70–99)
Glucose-Capillary: 293 mg/dL — ABNORMAL HIGH (ref 70–99)
Glucose-Capillary: 312 mg/dL — ABNORMAL HIGH (ref 70–99)

## 2012-09-25 MED ORDER — DEXTROSE 5 % IV SOLN
1.0000 g | INTRAVENOUS | Status: DC
  Administered 2012-09-25 – 2012-09-26 (×2): 1 g via INTRAVENOUS
  Filled 2012-09-25 (×3): qty 10

## 2012-09-25 MED ORDER — METOPROLOL TARTRATE 25 MG PO TABS
25.0000 mg | ORAL_TABLET | Freq: Two times a day (BID) | ORAL | Status: DC
  Administered 2012-09-25 – 2012-10-01 (×11): 25 mg
  Filled 2012-09-25 (×14): qty 1

## 2012-09-25 MED ORDER — INSULIN ASPART 100 UNIT/ML ~~LOC~~ SOLN
6.0000 [IU] | SUBCUTANEOUS | Status: DC
  Administered 2012-09-25 – 2012-09-28 (×17): 6 [IU] via SUBCUTANEOUS

## 2012-09-25 MED ORDER — INSULIN ASPART 100 UNIT/ML ~~LOC~~ SOLN
5.0000 [IU] | SUBCUTANEOUS | Status: DC
  Administered 2012-09-25: 5 [IU] via SUBCUTANEOUS

## 2012-09-25 NOTE — Progress Notes (Signed)
Clinical Social Work Department BRIEF PSYCHOSOCIAL ASSESSMENT 09/25/2012  Patient:  Rachel Benton, Rachel Benton     Account Number:  [id number]     Admit date:  09/21/2012  Clinical Social Worker:  Hadley Pen  Date/Time:  09/25/2012 10:37 AM  Referred by:  Physician  Date Referred:  09/24/2012 Referred for  SNF Placement   Other Referral:   Interview type:  Family Other interview type:   Philbert Riser (daughter) [phone], document review    PSYCHOSOCIAL DATA Living Status:  FAMILY Admitted from facility:   Level of care:   Primary support name:  Philbert Riser [phone] Primary support relationship to patient:  CHILD, ADULT Degree of support available:   Strong    CURRENT CONCERNS Current Concerns  Post-Acute Placement   Other Concerns:    SOCIAL WORK ASSESSMENT / PLAN At patient's request, CSW spoke with patient's daughter, Philbert Riser, regarding suggested PT after discharge. CSW and daughter discussed SNF options and daughter agreed for CSW to fax out patient's info for bed offers. Daughter did state a preference for [facility] or [facility]. CSW will continue to follow for d/c planning.   Assessment/plan status:  Information/Referral to [facility] Other assessment/ plan:   Information/referral to community resources:   CSW provided patient's daughter with a list of SNF facilities in [place]    PATIENT'S/FAMILY'S RESPONSE TO PLAN OF CARE: Patient & daughter were agreeable to CSW faxing out information for SNF bed offers.    Belenda Cruise Kailer Heindel, LCSWA

## 2012-09-25 NOTE — Progress Notes (Signed)
Inpatient Diabetes Program Recommendations  AACE/ADA: New Consensus Statement on Inpatient Glycemic Control (2013)  Target Ranges:  Prepandial:   less than 140 mg/dL      Peak postprandial:   less than 180 mg/dL (1-2 hours)      Critically ill patients:  140 - 180 mg/dL   In order to get the benefit of the nutrients from the tube feedings, pt needs insulin to cover.  Inpatient Diabetes Program Recommendations Insulin - Meal Coverage: Please add tube feed coverage of 4-5 units novolog q 4 hrs to cover carbohydrate  content of tube feeds.  Thank you, Lenor Coffin, RN, CNS, Diabetes Coordinator [phone])

## 2012-09-25 NOTE — Progress Notes (Signed)
Pt to TX to 4N-05, VSS. Up to chair w/PT today, back to bed w/lift.

## 2012-09-25 NOTE — Progress Notes (Signed)
[facility] Progress Note [place] TEAM 1 - Stepdown/ICU TEAM   Rachel Benton:[id number] DOB: Sep 10, 1940 DOA: 09/21/2012 PCP: Elby Showers, MD  Brief narrative: 72 y.o. female, with recent diagnosis of rectal cancer with rectal bleeding under the care of Dr. Truett Perna and Dr. 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  She was brought to the ER, code stroke was called she was seen by Neurology. Head CT did not show any acute change.  Due to her rectal cancer and history of rectal bleeding she was not a TPA candidate.    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 and acute to subacute lacunar infarct in the posterior left MCA territory (right cerebellar and right thalamic infarcts) felt to be thromboembolic secondary to occluded right M1 noted on MRA - no signif stenosis on carotids - no further w/u indicated per Stroke Team - to continue ASA for CVA prophy  Dysphagia failed her bedside swallow screen - feeding tube placed 8/6 for short term - discussed need to make more long term decisions regarding PEG vs/ comfort care by early next week with family today - they are not yet ready to comment on what the pt would desire should she not prove to recover to point of oral intake   DIABETES MELLITUS, TYPE II CBG's increasing on TF - add Novolog 5 units q 4 hours  Hypokalemia Resolved w/ replacement   Hyponatremia  Hydrate and follow  UTI Urine cx not obtained unfortunately - will complete 5 days of empiric tx and follow clinically   HYPERLIPIDEMIA On statin   ANXIETY, MILD On chronic benzo as outpt -  clarified with family today that patient only uses "every now and then" - stopped scheduled dosing and follow clinically with prn only   HYPERTENSION BP currently reasonably controlled   Tachycardia/ocasional bursts of ST w/ frequent PAC's vs PAF Volume depletion v/s anxiety - also likely BB withdrawal - follow clinically - appears to be improving   BACK PAIN, CHRONIC  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 Truett Perna  Protein-calorie malnutrition, severe Tube feeding ongoing   Code Status: FULL Family Communication: spoke w/ two family members at bedside Disposition Plan: transfer to 4N Telemetry - monitor for improvement in neuro status over the next 3 days - if no change will need to d/w family re: PEG + SNF  Consultants: Neurology  Procedures: 8/5 - Cartotid dopplers - Bilateral: 1-39% ICA stenosis 8/5 - TTE - EF 55-60% - grade I DD - no obvious source of emboli  Antibiotics: Rocephin 8/5 >>  DVT prophylaxis: SCDs  HPI/Subjective: Pt unresponsive to exam or attempts to interview.  Objective: Blood pressure 138/90, pulse 109, temperature 97.4 F (36.3 C), temperature source Oral, resp. rate 28, height 4\' 9"  (1.448 m), weight 63.8 kg (140 lb 10.5 oz), SpO2 99.00%.  Intake/Output Summary (Last 24 hours) at 09/25/12 1642 Last data filed at 09/25/12 1545  Gross per 24 hour  Intake 1137.75 ml  Output      0 ml  Net 1137.75 ml   Exam: General: No acute respiratory  distress evident  Lungs: Clear to auscultation bilaterally without wheezes or crackles Cardiovascular: Tachycardic but regular rhythm without murmur gallop or rub normal S1 and S2 Abdomen: Nontender, nondistended, soft, bowel sounds positive, no rebound, no ascites, no appreciable mass-PANDA tube in place w/ TF infusing Extremities: No significant cyanosis, clubbing, or edema bilateral lower extremities  Data Reviewed: Basic  Metabolic Panel:  Recent Labs Lab 09/21/12 1332 09/21/12 1343 09/22/12 0745 09/23/12 0534 09/24/12 0500  NA 130* 131* 134* 132* 132*  K 3.4* 3.4* 3.8 3.9 3.8  CL 92* 93* 98 96 96  CO2 25  --  27 25 22   GLUCOSE 263* 256* 156* 136* 159*  BUN 8 6 6  4* 6  CREATININE 0.49* 0.60 0.32* 0.26* 0.27*  CALCIUM 8.3*  --  8.3* 8.3* 8.3*   Liver Function Tests:  Recent Labs Lab 09/21/12 1332  AST 14  ALT 8  ALKPHOS 230*  BILITOT 0.8  PROT 5.5*  ALBUMIN 2.2*   CBC:  Recent Labs Lab 09/21/12 1332 09/21/12 1343 09/22/12 0440 09/23/12 0534 09/24/12 0500  WBC 4.0  --  3.3* 3.9* 4.1  NEUTROABS 3.3  --   --   --   --   HGB 9.8* 9.9* 8.5* 9.2* 10.6*  HCT 29.9* 29.0* 26.1* 28.6* 32.8*  MCV 80.6  --  80.6 81.3 81.4  PLT 95*  --  89* 85* 87*   CBG:  Recent Labs Lab 09/24/12 2047 09/24/12 2355 09/25/12 0401 09/25/12 0825 09/25/12 1139  GLUCAP 247* 247* 254* 291* 312*    Recent Results (from the past 240 hour(s))  MRSA PCR SCREENING     Status: None   Collection Time    09/22/12  3:33 AM      Result Value Range Status   MRSA by PCR NEGATIVE  NEGATIVE Final   Comment:            The GeneXpert MRSA Assay (FDA     approved for NASAL specimens     only), is one component of a     comprehensive MRSA colonization     surveillance program. It is not     intended to diagnose MRSA     infection nor to guide or     monitor treatment for     MRSA infections.     Studies:  Recent x-ray studies have been reviewed in detail by the Attending Physician  Scheduled Meds:  Scheduled Meds: . antiseptic oral rinse  15 mL Mouth Rinse q12n4p  . aspirin  325 mg Per Tube Daily  . chlorhexidine  15 mL Mouth Rinse BID  . ferrous sulfate  300 mg Oral Q breakfast  . free water  100 mL Per Tube Q6H  . insulin aspart  5 Units Subcutaneous Q4H  . metoprolol tartrate  25 mg Per Tube BID  . simvastatin  40 mg Per Tube q1800    Time spent on care of this patient:    [name],[name] ANP  [facility] Office  [phone] Pager - Text Page per Loretha Stapler as per below:  On-Call/Text Page:      [email or web address]      password [email or web address]  If 7PM-7AM, please contact night-coverage [email or web address] Password [email or web address] 09/25/2012, 4:42 PM   LOS: 4 days   I have personally examined this patient and reviewed the entire database. I have reviewed the above note, made any necessary editorial changes, and agree with its content.  Lonia Blood, MD [facility]

## 2012-09-25 NOTE — Progress Notes (Signed)
Clinical Social Work Department CLINICAL SOCIAL WORK PLACEMENT NOTE 09/25/2012  Patient:  Rachel Benton, Rachel Benton  Account Number:  [id number] Admit date:  09/21/2012  Clinical Social Worker:  Samuella Bruin, Theresia Majors  Date/time:  09/24/2012 10:50 AM  Clinical Social Work is seeking post-discharge placement for this patient at the following level of care:   SKILLED NURSING   (*CSW will update this form in Epic as items are completed)   09/24/2012  Patient/family provided with [facility] Department of Clinical Social Work's list of facilities offering this level of care within the geographic area requested by the patient (or if unable, by the patient's family).  09/24/2012  Patient/family informed of their freedom to choose among providers that offer the needed level of care, that participate in Medicare, Medicaid or managed care program needed by the patient, have an available bed and are willing to accept the patient.  09/24/2012  Patient/family informed of [facility]' ownership interest in [facility], as well as of the fact that they are under no obligation to receive care at this facility.  PASARR submitted to [employer] on 11/02/2007 PASARR number received from [employer] on 11/02/2007  FL2 transmitted to all facilities in geographic area requested by pt/family on  09/25/2012 FL2 transmitted to all facilities within larger geographic area on   Patient informed that his/her managed care company has contracts with or will negotiate with  certain facilities, including the following:     Patient/family informed of bed offers received:   Patient chooses bed at  Physician recommends and patient chooses bed at    Patient to be transferred to  on   Patient to be transferred to facility by   The following physician request were entered in Epic:   Additional Comments:   Kikue Gerhart, [employer]

## 2012-09-25 NOTE — Progress Notes (Signed)
Physical Therapy Treatment Patient Details Name: GAE BIHL MRN: [id number] DOB: September 08, 1940 Today's Date: 09/25/2012 Time: 3086-5784 PT Time Calculation (min): 24 min  PT Assessment / Plan / Recommendation  History of Present Illness Pt admit with R CVA.  Recently completed chemo and radiation for rectal CA.     PT Comments   Pt admitted with R CVA. Pt currently with functional limitations due to continued deficits  With lethargy, poor cognition and poor mobility.   NHP is best option at this point to maximize patient's recovery.  Pt will benefit from skilled PT to increase their independence and safety with mobility to allow discharge to the venue listed below.   Follow Up Recommendations  Supervision/Assistance - 24 hour;SNF                 Equipment Recommendations  Other (comment) (TBA)        Frequency Min 3X/week   Progress towards PT Goals Progress towards PT goals: Progressing toward goals  Plan Current plan remains appropriate    Precautions / Restrictions Precautions Precautions: Fall Restrictions Weight Bearing Restrictions: No   Pertinent Vitals/Pain VSS, No pain    Mobility  Bed Mobility Bed Mobility: Rolling Left;Left Sidelying to Sit;Sitting - Scoot to Edge of Bed Rolling Left: 1: +1 Total assist Left Sidelying to Sit: 1: +1 Total assist Sitting - Scoot to Edge of Bed: 1: +1 Total assist Details for Bed Mobility Assistance: Pt initiated movement slightly.  Used pad to assist pt to EOB.   Transfers Transfers: Sit to Stand;Stand to Sit;Squat Pivot Transfers Sit to Stand: 1: +2 Total assist;With upper extremity assist;From bed Sit to Stand: Patient Percentage: 10% Stand to Sit: 1: +2 Total assist;With upper extremity assist;To chair/3-in-1;With armrests Stand to Sit: Patient Percentage: 10% Squat Pivot Transfers: 1: +2 Total assist;With upper extremity assistance Squat Pivot Transfers: Patient Percentage: 10% Details for Transfer Assistance: Pt needed  cues for positioning during transfer.  Had to block patient''s left knee.  Pt unable to weight bear entire transfer.  Did better with weight bearing statically.  Maintained flexed posture throughout.   Ambulation/Gait Ambulation/Gait Assistance: Not tested (comment) Stairs: No Wheelchair Mobility Wheelchair Mobility: No Modified Rankin (Stroke Patients Only) Pre-Morbid Rankin Score: Moderately severe disability Modified Rankin: Severe disability     PT Goals (current goals can now be found in the care plan section)    Visit Information  Last PT Received On: 09/25/12 Assistance Needed: +2 History of Present Illness: Pt admit with R CVA.  Recently completed chemo and radiation for rectal CA.      Subjective Data  Subjective: "I would like to sit up."   Cognition  Cognition Arousal/Alertness: Lethargic Behavior During Therapy: Flat affect Overall Cognitive Status: History of cognitive impairments - at baseline    Balance  Static Sitting Balance Static Sitting - Balance Support: Feet supported;Right upper extremity supported Static Sitting - Level of Assistance: 1: +1 Total assist;4: Min assist Static Sitting - Comment/# of Minutes: Pt sat for 8 minutes needing varying assist from total assist to min guard assist at times.  Longest pt was noted to be min guard assist was 45 seconds.  Pt did fatigue quickly and need more and more asssit the longer she sat.  Pt attempted to extend neck to command.  Still would not open eyes to command.  Kicked right LE to command.    End of Session PT - End of Session Equipment Utilized During Treatment: Gait belt;Oxygen Activity Tolerance: Patient  limited by fatigue;Patient limited by lethargy Patient left: in chair;with call bell/phone within reach Nurse Communication: Mobility status;Need for lift equipment        INGOLD,Winnell Bento 09/25/2012, 12:55 PM [facility] [phone] (pager)

## 2012-09-25 NOTE — Progress Notes (Signed)
CSW spoke with daughter Philbert Riser [phone]) regarding short term PT after rehab and was given permission to fax out patient's information for SNF bed offers. Will continue to follow.   Belenda Cruise Kyria Bumgardner, LCSWA

## 2012-09-26 ENCOUNTER — Inpatient Hospital Stay (HOSPITAL_COMMUNITY): Payer: Medicare Other

## 2012-09-26 DIAGNOSIS — C775 Secondary and unspecified malignant neoplasm of intrapelvic lymph nodes: Secondary | ICD-10-CM

## 2012-09-26 LAB — BASIC METABOLIC PANEL
BUN: 13 mg/dL (ref 6–23)
GFR calc Af Amer: >90 mL/min (ref >90)
GFR calc non Af Amer: >90 mL/min (ref >90)
Potassium: 3.1 mEq/L — ABNORMAL LOW (ref 3.5–5.1)
Sodium: 133 mEq/L — ABNORMAL LOW (ref 135–145)

## 2012-09-26 LAB — CBC
Hemoglobin: 9.4 g/dL — ABNORMAL LOW (ref 12.0–15.0)
MCHC: 31.5 g/dL (ref 30.0–36.0)
Platelets: 135 10*3/uL — ABNORMAL LOW (ref 150–400)
RBC: 3.59 MIL/uL — ABNORMAL LOW (ref 3.87–5.11)

## 2012-09-26 LAB — GLUCOSE, CAPILLARY
Glucose-Capillary: 295 mg/dL — ABNORMAL HIGH (ref 70–99)
Glucose-Capillary: 273 mg/dL — ABNORMAL HIGH (ref 70–99)
Glucose-Capillary: 277 mg/dL — ABNORMAL HIGH (ref 70–99)
Glucose-Capillary: 307 mg/dL — ABNORMAL HIGH (ref 70–99)

## 2012-09-26 MED ORDER — POTASSIUM CHLORIDE 20 MEQ/15ML (10%) PO LIQD
40.0000 meq | Freq: Once | ORAL | Status: AC
  Administered 2012-09-26: 40 meq
  Filled 2012-09-26: qty 30

## 2012-09-26 MED ORDER — ACETAMINOPHEN 325 MG PO TABS
650.0000 mg | ORAL_TABLET | Freq: Once | ORAL | Status: AC
  Administered 2012-09-26: 650 mg via ORAL
  Filled 2012-09-26: qty 2

## 2012-09-26 MED ORDER — ACETAMINOPHEN 325 MG PO TABS
650.0000 mg | ORAL_TABLET | Freq: Four times a day (QID) | ORAL | Status: DC | PRN
  Administered 2012-09-26 – 2012-09-29 (×3): 650 mg
  Filled 2012-09-26 (×3): qty 2

## 2012-09-26 NOTE — Progress Notes (Signed)
Pt OOB to chair for 2 hours today via Maxi Lift, tolerated well. Back to bed with call light in reach, and heat pack applied to sore right shoulder. Daughter at bedside. Will cont to monitor

## 2012-09-26 NOTE — Progress Notes (Signed)
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 and acute to subacute lacunar infarct in the posterior left MCA territory (right cerebellar and right thalamic infarcts)  felt to be thromboembolic secondary to occluded right M1 noted on MRA - no signif stenosis on carotids - no further w/u indicated per Stroke Team - to continue ASA for CVA prophy  Dysphagia  -failed her bedside swallow screen - feeding tube placed 8/6 for short term - -discussed need to make more long term decisions regarding PEG vs/ comfort care by early next week with family  -not yet ready to comment on what the pt would desire should she not prove to recover to point of oral intake  -daughter at bedside states "I thought we were going to give her 7-10 days before deciding on a feeding tube" DIABETES MELLITUS, TYPE II  CBG's increasing on TF - add Novolog 5 units q 4 hours  Hypokalemia  Resolved w/ replacement  Hyponatremia  Hydrate and follow  UTI  Urine cx not obtained unfortunately - will complete 5 days of empiric tx and follow clinically  HYPERLIPIDEMIA  On statin  ANXIETY, MILD  On chronic benzo as outpt - clarified with family today that patient only uses "every now and then" - stopped scheduled dosing and follow clinically with prn only  HYPERTENSION  BP currently reasonably controlled  Tachycardia/ocasional bursts of ST w/ frequent PAC's vs PAF  Volume depletion v/s anxiety - also likely BB withdrawal - follow clinically - appears to be improving  BACK PAIN, CHRONIC  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 Truett Perna  Protein-calorie malnutrition, severe  -Tube feeding ongoing via PANDA tube -tolerating TF Code Status: FULL  Family Communication: spoke w/ two family members at bedside   Disposition Plan: transfer to [facility] Telemetry - monitor for improvement in neuro status over the next 3 days - if no change will need to d/w family re: PEG + SNF  Consultants:  Neurology  Procedures:  8/5 - Cartotid dopplers - Bilateral: 1-39% ICA stenosis  8/5 - TTE - EF 55-60% - grade I DD - no obvious source of emboli  Antibiotics:  Rocephin 8/5 >>    Family Communication:   Pt at beside Disposition Plan:   Home when medically stable        Procedures/Studies: Ct Head Wo Contrast  09/21/2012   *RADIOLOGY REPORT*  Clinical Data: 72 year old female - code stroke - altered mental status.  CT HEAD WITHOUT CONTRAST  Technique:  Contiguous axial images were obtained from the base of the skull through the vertex without contrast.  Comparison: 06/15/2008 CT and MRI  Findings: Remote right cerebellar and right thalamic infarcts noted as well as mild chronic small vessel white matter ischemic changes.  No acute intracranial abnormalities are identified, including mass lesion or mass effect, hydrocephalus, extra-axial fluid collection, midline shift, hemorrhage, or acute infarction.  The visualized bony calvarium is unremarkable.  IMPRESSION: No evidence of acute intracranial abnormality.  Remote right cerebellar and right thalamic infarcts and mild chronic small vessel white matter ischemic changes.   Original Report Authenticated By: Harmon Pier, M.D.   Mr Brain Wo Contrast  09/22/2012   *RADIOLOGY REPORT*  Clinical Data:  72 year old female with sudden onset left side weakness and facial droop.  Not at tPA candidate due to history of  rectal cancer/rectal bleeding.  Recent diagnosis of rectal cancer. History of prior stroke affecting the left side.  Comparison: Head CT without contrast 09/21/2012.  Brain MRI/MRA 06/16/2008.  MRI HEAD WITHOUT CONTRAST  Technique: Multiplanar, multiecho pulse sequences of the brain and surrounding structures were obtained according to standard protocol without  intravenous contrast.  Findings: Confluent restricted diffusion throughout much of the right MCA territory, affecting cortex and white matter.  Basal ganglia partially affected.  Overall moderate stroke size. Associated cytotoxic edema.  No midline shift or significant mass effect at this time.  No associated acute intracranial hemorrhage. Occasional small foci of restricted diffusion also in the right occipital lobe.  No posterior fossa involvement.  There could be a punctate area of restricted diffusion also in the left posterior MCA territory, left parietal lobe on series 4 image 18.  Major intracranial vascular flow voids are stable at the skull base, but the right MCA M1 segment may now be abnormal.  See MRA findings below.  Interval expected evolution of the right thalamic lacunar infarct which occurred in 2010.  Chronic right cerebellar infarct is stable.  No ventriculomegaly.  Negative pituitary, cervicomedullary junction and visualized cervical spine.  No extra-axial collection. No vasogenic cerebral edema is identified.  Bone marrow signal now is mostly abnormal in the skull and visible cervical spine.  Marrow signal is normal in 2010.  Visualized orbit soft tissues are within normal limits.  Mild mastoid effusions greater on the right.  Stable paranasal sinuses. Negative scalp soft tissues.  IMPRESSION: 1.  Moderate sized confluent right MCA infarct with cytotoxic edema. See MRA findings below.  No associated hemorrhage or mass effect at this time. 2.  Possible superimposed acute to subacute lacunar infarct in the posterior left MCA territory. 3.  Otherwise, stable brain with expected evolution of the 2010 right thalamus lacunar infarct. 4.  Bone marrow signal has become abnormal since 2010.  This could be related to anemia due to GI bleeding or cancer therapy. Metastatic disease to bone is less likely. 5.  Mild mastoid effusions.  MRA HEAD WITHOUT CONTRAST  Technique: Angiographic images of the [place] of  [place] were obtained using MRA technique without  intravenous contrast.  Findings: Artifact at the cervicomedullary junction.  Stable diminutive posterior circulation with antegrade flow in the distal vertebral arteries.  Patent PICA origins.  Patent basilar artery without stenosis.  Fetal type bilateral PCA origins re- identified.  Chronic left PCA superior division origin stenosis. Chronic tandem stenoses in the right PCA distal P1 and P2 segment, not significantly changed.  Stable antegrade flow in both ICA siphons.  No ICA stenosis. Patent carotid termini.  Bilateral ACA and left MCA origins are patent.  Diminutive or absent anterior communicating artery.  Visualized bilateral ACA branches are within normal limits.  Stable visualized left MCA branches.  Right MCA occlusion just beyond the M1 segment origin.  No distal right MCA flow.  IMPRESSION: 1.  New right MCA occlusion just beyond its origin. 2.  Otherwise stable intracranial MRA including proximal right PCA tandem stenoses and distal left PCA stenosis.  Salient findings discussed with Dr. Leroy Kennedy by telephone at 2020 hours on 09/22/2012.   Original Report Authenticated By: Erskine Speed, M.D.   Dg Chest Port 1 View  09/21/2012   *RADIOLOGY REPORT*  Clinical Data: Cough, weakness, question stroke  PORTABLE CHEST - 1 VIEW  Comparison: Portable exam 1627 hours compared to 06/15/2008  Findings: Enlargement of cardiac silhouette. Tortuous aorta with atherosclerotic calcification  at arch. Mediastinal contours and pulmonary vascularity normal. Lungs clear. No pleural effusion or pneumothorax. Bones demineralized.  IMPRESSION: Mild enlargement of cardiac silhouette. No acute abnormalities.   Original Report Authenticated By: Ulyses Southward, M.D.   Dg Abd Portable 1v  09/23/2012   *RADIOLOGY REPORT*  Clinical Data: Feeding tube placement.  PORTABLE ABDOMEN - 1 VIEW  Comparison: None.  Findings: Single portable view of the abdomen submitted.  There is nonspecific  nonobstructive bowel gas pattern.  There is a NG feeding tube with tip in distal gastric region.  IMPRESSION: NG feeding tube with tip in distal gastric region.   Original Report Authenticated By: Natasha Mead, M.D.   Mr Mra Head/brain Wo Cm  09/22/2012   *RADIOLOGY REPORT*  Clinical Data:  72 year old female with sudden onset left side weakness and facial droop.  Not at tPA candidate due to history of rectal cancer/rectal bleeding.  Recent diagnosis of rectal cancer. History of prior stroke affecting the left side.  Comparison: Head CT without contrast 09/21/2012.  Brain MRI/MRA 06/16/2008.  MRI HEAD WITHOUT CONTRAST  Technique: Multiplanar, multiecho pulse sequences of the brain and surrounding structures were obtained according to standard protocol without intravenous contrast.  Findings: Confluent restricted diffusion throughout much of the right MCA territory, affecting cortex and white matter.  Basal ganglia partially affected.  Overall moderate stroke size. Associated cytotoxic edema.  No midline shift or significant mass effect at this time.  No associated acute intracranial hemorrhage. Occasional small foci of restricted diffusion also in the right occipital lobe.  No posterior fossa involvement.  There could be a punctate area of restricted diffusion also in the left posterior MCA territory, left parietal lobe on series 4 image 18.  Major intracranial vascular flow voids are stable at the skull base, but the right MCA M1 segment may now be abnormal.  See MRA findings below.  Interval expected evolution of the right thalamic lacunar infarct which occurred in 2010.  Chronic right cerebellar infarct is stable.  No ventriculomegaly.  Negative pituitary, cervicomedullary junction and visualized cervical spine.  No extra-axial collection. No vasogenic cerebral edema is identified.  Bone marrow signal now is mostly abnormal in the skull and visible cervical spine.  Marrow signal is normal in 2010.  Visualized  orbit soft tissues are within normal limits.  Mild mastoid effusions greater on the right.  Stable paranasal sinuses. Negative scalp soft tissues.  IMPRESSION: 1.  Moderate sized confluent right MCA infarct with cytotoxic edema. See MRA findings below.  No associated hemorrhage or mass effect at this time. 2.  Possible superimposed acute to subacute lacunar infarct in the posterior left MCA territory. 3.  Otherwise, stable brain with expected evolution of the 2010 right thalamus lacunar infarct. 4.  Bone marrow signal has become abnormal since 2010.  This could be related to anemia due to GI bleeding or cancer therapy. Metastatic disease to bone is less likely. 5.  Mild mastoid effusions.  MRA HEAD WITHOUT CONTRAST  Technique: Angiographic images of the Circle of [place] were obtained using MRA technique without  intravenous contrast.  Findings: Artifact at the cervicomedullary junction.  Stable diminutive posterior circulation with antegrade flow in the distal vertebral arteries.  Patent PICA origins.  Patent basilar artery without stenosis.  Fetal type bilateral PCA origins re- identified.  Chronic left PCA superior division origin stenosis. Chronic tandem stenoses in the right PCA distal P1 and P2 segment, not significantly changed.  Stable antegrade flow in both ICA siphons.  No ICA  stenosis. Patent carotid termini.  Bilateral ACA and left MCA origins are patent.  Diminutive or absent anterior communicating artery.  Visualized bilateral ACA branches are within normal limits.  Stable visualized left MCA branches.  Right MCA occlusion just beyond the M1 segment origin.  No distal right MCA flow.  IMPRESSION: 1.  New right MCA occlusion just beyond its origin. 2.  Otherwise stable intracranial MRA including proximal right PCA tandem stenoses and distal left PCA stenosis.  Salient findings discussed with Dr. Leroy Kennedy by telephone at 2020 hours on 09/22/2012.   Original Report Authenticated By: Erskine Speed, M.D.          Subjective: Patient is somnolent but wakes up on examination. Denies any headache, chest pain, shortness breath, vomiting, diarrhea, abdominal pain.  Objective: Filed Vitals:   09/25/12 2158 09/26/12 0314 09/26/12 0452 09/26/12 0629  BP: 138/67 125/62  133/71  Pulse: 98 85  91  Temp: 99 F (37.2 C) 97.3 F (36.3 C)  97.3 F (36.3 C)  TempSrc: Oral Axillary  Axillary  Resp: 24 20  18   Height:      Weight:   64.8 kg (142 lb 13.7 oz)   SpO2: 98% 97%  100%    Intake/Output Summary (Last 24 hours) at 09/26/12 0858 Last data filed at 09/25/12 1700  Gross per 24 hour  Intake 1097.75 ml  Output      0 ml  Net 1097.75 ml   Weight change: 1 kg (2 lb 3.3 oz) Exam:   General:  Pt is alert, follows commands appropriately, not in acute distress  HEENT: No icterus, No thrush,  [place]/AT,NG tube in place  Cardiovascular: RRR, S1/S2, no rubs, no gallops  Respiratory: diminished breath sounds at the bases. No wheezing.  Abdomen: Soft/+BS, non tender, non distended, no guarding  Extremities: No edema, No lymphangitis, No petechiae, No rashes, no synovitis  Data Reviewed: Basic Metabolic Panel:  Recent Labs Lab 09/21/12 1332 09/21/12 1343 09/22/12 0745 09/23/12 0534 09/24/12 0500 09/26/12 0500  NA 130* 131* 134* 132* 132* 133*  K 3.4* 3.4* 3.8 3.9 3.8 3.1*  CL 92* 93* 98 96 96 96  CO2 25  --  27 25 22 26   GLUCOSE 263* 256* 156* 136* 159* 256*  BUN 8 6 6  4* 6 13  CREATININE 0.49* 0.60 0.32* 0.26* 0.27* 0.25*  CALCIUM 8.3*  --  8.3* 8.3* 8.3* 8.1*   Liver Function Tests:  Recent Labs Lab 09/21/12 1332  AST 14  ALT 8  ALKPHOS 230*  BILITOT 0.8  PROT 5.5*  ALBUMIN 2.2*   No results found for this basename: LIPASE, AMYLASE,  in the last 168 hours No results found for this basename: AMMONIA,  in the last 168 hours CBC:  Recent Labs Lab 09/21/12 1332 09/21/12 1343 09/22/12 0440 09/23/12 0534 09/24/12 0500 09/26/12 0500  WBC 4.0  --  3.3* 3.9* 4.1  3.4*  NEUTROABS 3.3  --   --   --   --   --   HGB 9.8* 9.9* 8.5* 9.2* 10.6* 9.4*  HCT 29.9* 29.0* 26.1* 28.6* 32.8* 29.8*  MCV 80.6  --  80.6 81.3 81.4 83.0  PLT 95*  --  89* 85* 87* 135*   Cardiac Enzymes:  Recent Labs Lab 09/21/12 1332  TROPONINI <0.30   BNP: No components found with this basename: POCBNP,  CBG:  Recent Labs Lab 09/25/12 2000 09/25/12 2347 09/26/12 0322 09/26/12 0448 09/26/12 0807  GLUCAP 263* 257* 284* 295* 273*  Recent Results (from the past 240 hour(s))  MRSA PCR SCREENING     Status: None   Collection Time    09/22/12  3:33 AM      Result Value Range Status   MRSA by PCR NEGATIVE  NEGATIVE Final   Comment:            The GeneXpert MRSA Assay (FDA     approved for NASAL specimens     only), is one component of a     comprehensive MRSA colonization     surveillance program. It is not     intended to diagnose MRSA     infection nor to guide or     monitor treatment for     MRSA infections.     Scheduled Meds: . antiseptic oral rinse  15 mL Mouth Rinse q12n4p  . aspirin  325 mg Per Tube Daily  . cefTRIAXone (ROCEPHIN)  IV  1 g Intravenous Q24H  . chlorhexidine  15 mL Mouth Rinse BID  . free water  100 mL Per Tube Q6H  . insulin aspart  6 Units Subcutaneous Q4H  . metoprolol tartrate  25 mg Per Tube BID  . simvastatin  40 mg Per Tube q1800   Continuous Infusions: . sodium chloride 10 mL/hr at 09/25/12 1545  . feeding supplement (JEVITY 1.2 CAL) 55 mL/hr at 09/25/12 1544     Chaeli Judy, DO  [facility] Pager [phone]  If 7PM-7AM, please contact night-coverage [email or web address] Password [email or web address] 09/26/2012, 8:58 AM   LOS: 5 days

## 2012-09-26 NOTE — Progress Notes (Signed)
Called for outdated peripheral IV restart. Family request not to restart IV at this time due to patient "has just started resting".

## 2012-09-27 ENCOUNTER — Inpatient Hospital Stay (HOSPITAL_COMMUNITY): Payer: Medicare Other

## 2012-09-27 ENCOUNTER — Encounter: Payer: Self-pay | Admitting: Radiation Oncology

## 2012-09-27 DIAGNOSIS — R063 Periodic breathing: Secondary | ICD-10-CM | POA: Diagnosis present

## 2012-09-27 LAB — BASIC METABOLIC PANEL
BUN: 10 mg/dL (ref 6–23)
CO2: 26 mEq/L (ref 19–32)
Calcium: 8.2 mg/dL — ABNORMAL LOW (ref 8.4–10.5)
Calcium: 8.2 mg/dL — ABNORMAL LOW (ref 8.4–10.5)
Creatinine, Ser: 0.24 mg/dL — ABNORMAL LOW (ref 0.50–1.10)
GFR calc Af Amer: >90 mL/min (ref >90)
GFR calc non Af Amer: >90 mL/min (ref >90)
Glucose, Bld: 295 mg/dL — ABNORMAL HIGH (ref 70–99)
Potassium: 3.5 mEq/L (ref 3.5–5.1)
Sodium: 135 mEq/L (ref 135–145)

## 2012-09-27 LAB — CBC
HCT: 33.5 % — ABNORMAL LOW (ref 36.0–46.0)
Hemoglobin: 10.3 g/dL — ABNORMAL LOW (ref 12.0–15.0)
MCH: 25.4 pg — ABNORMAL LOW (ref 26.0–34.0)
MCHC: 30.7 g/dL (ref 30.0–36.0)
MCV: 82.7 fL (ref 78.0–100.0)
RDW: 30 % — ABNORMAL HIGH (ref 11.5–15.5)

## 2012-09-27 LAB — GLUCOSE, CAPILLARY
Glucose-Capillary: 244 mg/dL — ABNORMAL HIGH (ref 70–99)
Glucose-Capillary: 262 mg/dL — ABNORMAL HIGH (ref 70–99)

## 2012-09-27 LAB — BLOOD GAS, ARTERIAL
Acid-Base Excess: 3.2 mmol/L — ABNORMAL HIGH (ref 0.0–2.0)
Bicarbonate: 26.1 mEq/L — ABNORMAL HIGH (ref 20.0–24.0)
TCO2: 27 mmol/L (ref 0–100)
pCO2 arterial: 31.7 mmHg — ABNORMAL LOW (ref 35.0–45.0)
pH, Arterial: 7.525 — ABNORMAL HIGH (ref 7.350–7.450)
pO2, Arterial: 67.8 mmHg — ABNORMAL LOW (ref 80.0–100.0)

## 2012-09-27 LAB — TSH: TSH: 2.447 u[IU]/mL (ref 0.350–4.500)

## 2012-09-27 LAB — MAGNESIUM: Magnesium: 1.9 mg/dL (ref 1.5–2.5)

## 2012-09-27 LAB — TROPONIN I: Troponin I: <0.3 ng/mL (ref <0.30)

## 2012-09-27 MED ORDER — PIPERACILLIN-TAZOBACTAM 3.375 G IVPB 30 MIN
3.3750 g | Freq: Three times a day (TID) | INTRAVENOUS | Status: DC
  Administered 2012-09-27 – 2012-09-30 (×9): 3.375 g via INTRAVENOUS
  Filled 2012-09-27 (×12): qty 50

## 2012-09-27 NOTE — Progress Notes (Signed)
[facility] PROGRESS NOTE  UNIQUA KIHN ZHY:[id number] DOB: 01-14-1941 DOA: 09/21/2012 PCP: Elby Showers, MD  Brief narrative Called by RN to see patient do to tachypnea. 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 Patient is awake and alert, but with respirations of 38 reported by RN. Upon my arrival, the patient appears comfortable. The patient denies any chest discomfort, shortness of breath, nausea, vomiting, abdominal pain, headache, dysuria. patient denies any anxietyor jitteriness. Patient is tolerating tube feeds reported by RN.  Vital signs: temp -97.8-HR104-RR32-146/87--100%RA  General:  Pt is alert, follows commands appropriately, not in acute distress  HEENT: No icterus,  [place]/AT  Cardiovascular: RRR, S1/S2,   Respiratory: CTA bilaterally, no wheezing, no crackles  Abdomen: Soft/+BS, non tender, non distended, no guarding  Extremities: No edema, No lymphangitis, No rashes Assessment/Plan: tachypnea -likely due to Cheyne-Stokes respirations from the patient's acute stroke -Check ABG, chest x-ray -Check EKG -Cycle troponins -repeat BMP -check TSH, lactate Right MCA infarct with cytotoxic cerebral edema and acute to subacute lacunar infarct in the posterior left MCA territory (right cerebellar and right thalamic infarcts)  felt to be thromboembolic secondary to occluded right M1 noted on MRA - no signif stenosis on carotids - no further w/u indicated per Stroke Team - to continue ASA for CVA prophy  Dysphagia  -failed her bedside swallow screen - feeding tube placed 8/6 for short term - -discussed need to make more long term decisions regarding PEG vs/ comfort care by early next week with family  -not yet ready to comment on what the pt would desire should she not prove to recover to point of oral intake  -daughter at bedside states "I thought we were going to give her 7-10 days before deciding on a feeding tube"   DIABETES MELLITUS, TYPE II  CBG's increasing on TF - add Novolog 6 units q 4 hours  Hypokalemia  Resolved w/ replacement  Hyponatremia  Hydrate and follow  UTI  Urine cx not obtained unfortunately - will complete 5 days of empiric tx and follow clinically  HYPERLIPIDEMIA  On statin  ANXIETY, MILD  -currently, patient denies any feelings of anxiety or jitteriness On chronic benzo as outpt - clarified with family today that patient only uses "every now and then" - stopped scheduled dosing and follow clinically with prn only  HYPERTENSION  BP currently reasonably controlled  Tachycardia/ocasional bursts of ST w/ frequent PAC's vs PAF  Volume depletion v/s anxiety - also likely BB withdrawal - follow clinically - appears to be improving  BACK PAIN, CHRONIC  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 Truett Perna  Protein-calorie malnutrition, severe  -Tube feeding ongoing via PANDA tube  -tolerating TF  -repeat abdominal x-ray reveals that tube remains in the stomach antrum Code Status: FULL  Family Communication: spoke w/ two family members at bedside  Disposition Plan: transfer to 4N Telemetry - monitor for improvement in neuro status over the next 3 days - if no change will need to d/w family re: PEG + SNF  Consultants:  Neurology  Procedures:  8/5 - Cartotid dopplers - Bilateral: 1-39% ICA stenosis  8/5 - TTE - EF 55-60% - grade I DD - no obvious source of emboli  Antibiotics:  Rocephin 8/5 >>  Family Communication:   Granddaughter at beside Disposition Plan:   Home when medically stable        Procedures/Studies:  Ct Head Wo Contrast  09/21/2012   *RADIOLOGY REPORT*  Clinical Data: 72 year old female - code stroke - altered mental status.  CT HEAD WITHOUT CONTRAST  Technique:  Contiguous axial images were obtained from the base of the skull through the vertex without contrast.  Comparison:  06/15/2008 CT and MRI  Findings: Remote right cerebellar and right thalamic infarcts noted as well as mild chronic small vessel white matter ischemic changes.  No acute intracranial abnormalities are identified, including mass lesion or mass effect, hydrocephalus, extra-axial fluid collection, midline shift, hemorrhage, or acute infarction.  The visualized bony calvarium is unremarkable.  IMPRESSION: No evidence of acute intracranial abnormality.  Remote right cerebellar and right thalamic infarcts and mild chronic small vessel white matter ischemic changes.   Original Report Authenticated By: Harmon Pier, M.D.   Mr Brain Wo Contrast  09/22/2012   *RADIOLOGY REPORT*  Clinical Data:  72 year old female with sudden onset left side weakness and facial droop.  Not at tPA candidate due to history of rectal cancer/rectal bleeding.  Recent diagnosis of rectal cancer. History of prior stroke affecting the left side.  Comparison: Head CT without contrast 09/21/2012.  Brain MRI/MRA 06/16/2008.  MRI HEAD WITHOUT CONTRAST  Technique: Multiplanar, multiecho pulse sequences of the brain and surrounding structures were obtained according to standard protocol without intravenous contrast.  Findings: Confluent restricted diffusion throughout much of the right MCA territory, affecting cortex and white matter.  Basal ganglia partially affected.  Overall moderate stroke size. Associated cytotoxic edema.  No midline shift or significant mass effect at this time.  No associated acute intracranial hemorrhage. Occasional small foci of restricted diffusion also in the right occipital lobe.  No posterior fossa involvement.  There could be a punctate area of restricted diffusion also in the left posterior MCA territory, left parietal lobe on series 4 image 18.  Major intracranial vascular flow voids are stable at the skull base, but the right MCA M1 segment may now be abnormal.  See MRA findings below.  Interval expected evolution of the  right thalamic lacunar infarct which occurred in 2010.  Chronic right cerebellar infarct is stable.  No ventriculomegaly.  Negative pituitary, cervicomedullary junction and visualized cervical spine.  No extra-axial collection. No vasogenic cerebral edema is identified.  Bone marrow signal now is mostly abnormal in the skull and visible cervical spine.  Marrow signal is normal in 2010.  Visualized orbit soft tissues are within normal limits.  Mild mastoid effusions greater on the right.  Stable paranasal sinuses. Negative scalp soft tissues.  IMPRESSION: 1.  Moderate sized confluent right MCA infarct with cytotoxic edema. See MRA findings below.  No associated hemorrhage or mass effect at this time. 2.  Possible superimposed acute to subacute lacunar infarct in the posterior left MCA territory. 3.  Otherwise, stable brain with expected evolution of the 2010 right thalamus lacunar infarct. 4.  Bone marrow signal has become abnormal since 2010.  This could be related to anemia due to GI bleeding or cancer therapy. Metastatic disease to bone is less likely. 5.  Mild mastoid effusions.  MRA HEAD WITHOUT CONTRAST  Technique: Angiographic images of the Circle of Willis were obtained using MRA technique without  intravenous contrast.  Findings: Artifact at the cervicomedullary junction.  Stable diminutive posterior circulation with antegrade flow in the distal vertebral arteries.  Patent PICA origins.  Patent basilar artery without stenosis.  Fetal type bilateral PCA origins re- identified.  Chronic left PCA superior division origin stenosis. Chronic tandem stenoses  in the right PCA distal P1 and P2 segment, not significantly changed.  Stable antegrade flow in both ICA siphons.  No ICA stenosis. Patent carotid termini.  Bilateral ACA and left MCA origins are patent.  Diminutive or absent anterior communicating artery.  Visualized bilateral ACA branches are within normal limits.  Stable visualized left MCA branches.  Right  MCA occlusion just beyond the M1 segment origin.  No distal right MCA flow.  IMPRESSION: 1.  New right MCA occlusion just beyond its origin. 2.  Otherwise stable intracranial MRA including proximal right PCA tandem stenoses and distal left PCA stenosis.  Salient findings discussed with Dr. Leroy Kennedy by telephone at 2020 hours on 09/22/2012.   Original Report Authenticated By: Erskine Speed, M.D.   Dg Chest Port 1 View  09/21/2012   *RADIOLOGY REPORT*  Clinical Data: Cough, weakness, question stroke  PORTABLE CHEST - 1 VIEW  Comparison: Portable exam 1627 hours compared to 06/15/2008  Findings: Enlargement of cardiac silhouette. Tortuous aorta with atherosclerotic calcification at arch. Mediastinal contours and pulmonary vascularity normal. Lungs clear. No pleural effusion or pneumothorax. Bones demineralized.  IMPRESSION: Mild enlargement of cardiac silhouette. No acute abnormalities.   Original Report Authenticated By: Ulyses Southward, M.D.   Dg Abd Portable 1v  09/26/2012   *RADIOLOGY REPORT*  Clinical Data: Feeding tube placement  PORTABLE ABDOMEN - 1 VIEW  Comparison: September 23, 2012  Findings: Tip of the feeding tube is in the antrum of the stomach. Moderate stool in the ascending colon.  Mild distention of the colon.  IMPRESSION: Feeding tube tip is in the antrum of the stomach.   Original Report Authenticated By: Jolaine Click, M.D.   Dg Abd Portable 1v  09/23/2012   *RADIOLOGY REPORT*  Clinical Data: Feeding tube placement.  PORTABLE ABDOMEN - 1 VIEW  Comparison: None.  Findings: Single portable view of the abdomen submitted.  There is nonspecific nonobstructive bowel gas pattern.  There is a NG feeding tube with tip in distal gastric region.  IMPRESSION: NG feeding tube with tip in distal gastric region.   Original Report Authenticated By: Natasha Mead, M.D.   Mr Mra Head/brain Wo Cm  09/22/2012   *RADIOLOGY REPORT*  Clinical Data:  72 year old female with sudden onset left side weakness and facial droop.  Not at  tPA candidate due to history of rectal cancer/rectal bleeding.  Recent diagnosis of rectal cancer. History of prior stroke affecting the left side.  Comparison: Head CT without contrast 09/21/2012.  Brain MRI/MRA 06/16/2008.  MRI HEAD WITHOUT CONTRAST  Technique: Multiplanar, multiecho pulse sequences of the brain and surrounding structures were obtained according to standard protocol without intravenous contrast.  Findings: Confluent restricted diffusion throughout much of the right MCA territory, affecting cortex and white matter.  Basal ganglia partially affected.  Overall moderate stroke size. Associated cytotoxic edema.  No midline shift or significant mass effect at this time.  No associated acute intracranial hemorrhage. Occasional small foci of restricted diffusion also in the right occipital lobe.  No posterior fossa involvement.  There could be a punctate area of restricted diffusion also in the left posterior MCA territory, left parietal lobe on series 4 image 18.  Major intracranial vascular flow voids are stable at the skull base, but the right MCA M1 segment may now be abnormal.  See MRA findings below.  Interval expected evolution of the right thalamic lacunar infarct which occurred in 2010.  Chronic right cerebellar infarct is stable.  No ventriculomegaly.  Negative pituitary, cervicomedullary junction  and visualized cervical spine.  No extra-axial collection. No vasogenic cerebral edema is identified.  Bone marrow signal now is mostly abnormal in the skull and visible cervical spine.  Marrow signal is normal in 2010.  Visualized orbit soft tissues are within normal limits.  Mild mastoid effusions greater on the right.  Stable paranasal sinuses. Negative scalp soft tissues.  IMPRESSION: 1.  Moderate sized confluent right MCA infarct with cytotoxic edema. See MRA findings below.  No associated hemorrhage or mass effect at this time. 2.  Possible superimposed acute to subacute lacunar infarct in the  posterior left MCA territory. 3.  Otherwise, stable brain with expected evolution of the 2010 right thalamus lacunar infarct. 4.  Bone marrow signal has become abnormal since 2010.  This could be related to anemia due to GI bleeding or cancer therapy. Metastatic disease to bone is less likely. 5.  Mild mastoid effusions.  MRA HEAD WITHOUT CONTRAST  Technique: Angiographic images of the Circle of Willis were obtained using MRA technique without  intravenous contrast.  Findings: Artifact at the cervicomedullary junction.  Stable diminutive posterior circulation with antegrade flow in the distal vertebral arteries.  Patent PICA origins.  Patent basilar artery without stenosis.  Fetal type bilateral PCA origins re- identified.  Chronic left PCA superior division origin stenosis. Chronic tandem stenoses in the right PCA distal P1 and P2 segment, not significantly changed.  Stable antegrade flow in both ICA siphons.  No ICA stenosis. Patent carotid termini.  Bilateral ACA and left MCA origins are patent.  Diminutive or absent anterior communicating artery.  Visualized bilateral ACA branches are within normal limits.  Stable visualized left MCA branches.  Right MCA occlusion just beyond the M1 segment origin.  No distal right MCA flow.  IMPRESSION: 1.  New right MCA occlusion just beyond its origin. 2.  Otherwise stable intracranial MRA including proximal right PCA tandem stenoses and distal left PCA stenosis.  Salient findings discussed with Dr. Leroy Kennedy by telephone at 2020 hours on 09/22/2012.   Original Report Authenticated By: Erskine Speed, M.D.           Objective: Filed Vitals:   09/27/12 0500 09/27/12 0600 09/27/12 1007 09/27/12 1029  BP:  143/79 138/69 146/87  Pulse:  93 103 106  Temp:  99.7 F (37.6 C) 97.8 F (36.6 C)   TempSrc:   Axillary   Resp:  22 38 38  Height:      Weight: 64.7 kg (142 lb 10.2 oz)     SpO2:  96% 100% 98%   No intake or output data in the 24 hours ending 09/27/12  1048 Weight change: -0.1 kg (-3.5 oz)  Data Reviewed: Basic Metabolic Panel:  Recent Labs Lab 09/22/12 0745 09/23/12 0534 09/24/12 0500 09/26/12 0500 09/27/12 0600  NA 134* 132* 132* 133* 135  K 3.8 3.9 3.8 3.1* 3.6  CL 98 96 96 96 99  CO2 27 25 22 26 26   GLUCOSE 156* 136* 159* 256* 292*  BUN 6 4* 6 13 10   CREATININE 0.32* 0.26* 0.27* 0.25* 0.24*  CALCIUM 8.3* 8.3* 8.3* 8.1* 8.2*  MG  --   --   --   --  1.9   Liver Function Tests:  Recent Labs Lab 09/21/12 1332  AST 14  ALT 8  ALKPHOS 230*  BILITOT 0.8  PROT 5.5*  ALBUMIN 2.2*   No results found for this basename: LIPASE, AMYLASE,  in the last 168 hours No results found for this basename: AMMONIA,  in the last 168 hours CBC:  Recent Labs Lab 09/21/12 1332  09/22/12 0440 09/23/12 0534 09/24/12 0500 09/26/12 0500 09/27/12 0600  WBC 4.0  --  3.3* 3.9* 4.1 3.4* 3.1*  NEUTROABS 3.3  --   --   --   --   --   --   HGB 9.8*  < > 8.5* 9.2* 10.6* 9.4* 10.3*  HCT 29.9*  < > 26.1* 28.6* 32.8* 29.8* 33.5*  MCV 80.6  --  80.6 81.3 81.4 83.0 82.7  PLT 95*  --  89* 85* 87* 135* PLATELET CLUMPS NOTED ON SMEAR, COUNT APPEARS DECREASED  < > = values in this interval not displayed. Cardiac Enzymes:  Recent Labs Lab 09/21/12 1332  TROPONINI <0.30   BNP: No components found with this basename: POCBNP,  CBG:  Recent Labs Lab 09/26/12 1643 09/26/12 1958 09/27/12 0001 09/27/12 0514 09/27/12 0803  GLUCAP 307* 304* 244* 283* 262*    Recent Results (from the past 240 hour(s))  MRSA PCR SCREENING     Status: None   Collection Time    09/22/12  3:33 AM      Result Value Range Status   MRSA by PCR NEGATIVE  NEGATIVE Final   Comment:            The GeneXpert MRSA Assay (FDA     approved for NASAL specimens     only), is one component of a     comprehensive MRSA colonization     surveillance program. It is not     intended to diagnose MRSA     infection nor to guide or     monitor treatment for     MRSA  infections.     Scheduled Meds: . antiseptic oral rinse  15 mL Mouth Rinse q12n4p  . aspirin  325 mg Per Tube Daily  . cefTRIAXone (ROCEPHIN)  IV  1 g Intravenous Q24H  . chlorhexidine  15 mL Mouth Rinse BID  . free water  100 mL Per Tube Q6H  . insulin aspart  6 Units Subcutaneous Q4H  . metoprolol tartrate  25 mg Per Tube BID  . simvastatin  40 mg Per Tube q1800   Continuous Infusions: . sodium chloride 10 mL/hr at 09/25/12 1545  . feeding supplement (JEVITY 1.2 CAL) 1,000 mL (09/27/12 0741)     Matilde Pottenger, DO  [facility] Pager [phone]  If 7PM-7AM, please contact night-coverage [email or web address] Password [email or web address] 09/27/2012, 10:48 AM   LOS: 6 days

## 2012-09-27 NOTE — Progress Notes (Signed)
  Radiation Oncology         [phone] ________________________________  Name: Rachel Benton MRN: [id number]  Date: 09/27/2012  DOB: 1940/09/28  End of Treatment Note  Diagnosis:   Locally advanced rectal cancer     Indication for treatment:  Preop along with radiosensitizing chemotherapy       Radiation treatment dates:   June 18 through July 31  Site/dose:   Pelvis 45 gray in 25 fractions, the site of presentation along the rectum and associated nodes was boosted to 50.4 gray  Beams/energy:   4 field set up encompassing the posterior pelvis, a three-field boost technique using a PA and lateral fields  Narrative: The patient tolerated radiation treatment relatively well.   She did experience fatigue. She had somewhat of a poor appetite. The patient's pain in the pelvis area and rectal bleeding improved during her treatment.  Plan: The patient has completed radiation treatment. The patient will return to radiation oncology clinic for routine followup in one month. I advised them to call or return sooner if they have any questions or concerns related to their recovery or treatment.  -----------------------------------  Billie Lade, PhD, MD

## 2012-09-28 LAB — BASIC METABOLIC PANEL
CO2: 26 mEq/L (ref 19–32)
Calcium: 8.1 mg/dL — ABNORMAL LOW (ref 8.4–10.5)
Chloride: 99 mEq/L (ref 96–112)
Potassium: 3.4 mEq/L — ABNORMAL LOW (ref 3.5–5.1)
Sodium: 136 mEq/L (ref 135–145)

## 2012-09-28 LAB — CBC
HCT: 28.9 % — ABNORMAL LOW (ref 36.0–46.0)
Hemoglobin: 9.1 g/dL — ABNORMAL LOW (ref 12.0–15.0)
MCV: 82.6 fL (ref 78.0–100.0)
Platelets: 160 10*3/uL (ref 150–400)
RBC: 3.5 MIL/uL — ABNORMAL LOW (ref 3.87–5.11)
WBC: 3.9 10*3/uL — ABNORMAL LOW (ref 4.0–10.5)

## 2012-09-28 LAB — GLUCOSE, CAPILLARY
Glucose-Capillary: 325 mg/dL — ABNORMAL HIGH (ref 70–99)
Glucose-Capillary: 302 mg/dL — ABNORMAL HIGH (ref 70–99)
Glucose-Capillary: 236 mg/dL — ABNORMAL HIGH (ref 70–99)
Glucose-Capillary: 267 mg/dL — ABNORMAL HIGH (ref 70–99)

## 2012-09-28 MED ORDER — INSULIN ASPART 100 UNIT/ML ~~LOC~~ SOLN
0.0000 [IU] | SUBCUTANEOUS | Status: DC
  Administered 2012-09-28: 8 [IU] via SUBCUTANEOUS
  Administered 2012-09-28: 6 [IU] via SUBCUTANEOUS
  Administered 2012-09-29 (×2): 3 [IU] via SUBCUTANEOUS
  Administered 2012-09-29 (×2): 2 [IU] via SUBCUTANEOUS
  Administered 2012-09-29: 5 [IU] via SUBCUTANEOUS
  Administered 2012-09-29: 3 [IU] via SUBCUTANEOUS
  Administered 2012-09-30: 2 [IU] via SUBCUTANEOUS
  Administered 2012-09-30: 3 [IU] via SUBCUTANEOUS
  Administered 2012-09-30 – 2012-10-01 (×3): 2 [IU] via SUBCUTANEOUS
  Administered 2012-10-01: 3 [IU] via SUBCUTANEOUS
  Administered 2012-10-01: 2 [IU] via SUBCUTANEOUS

## 2012-09-28 MED ORDER — POTASSIUM CHLORIDE 20 MEQ/15ML (10%) PO LIQD
20.0000 meq | Freq: Once | ORAL | Status: AC
  Administered 2012-09-28: 20 meq via ORAL
  Filled 2012-09-28: qty 15

## 2012-09-28 MED ORDER — INSULIN ASPART 100 UNIT/ML ~~LOC~~ SOLN
4.0000 [IU] | SUBCUTANEOUS | Status: DC
  Administered 2012-09-28 – 2012-09-30 (×10): 4 [IU] via SUBCUTANEOUS

## 2012-09-28 MED ORDER — INSULIN ASPART 100 UNIT/ML ~~LOC~~ SOLN: 0.0000 [IU] | Freq: Three times a day (TID) | SUBCUTANEOUS | Status: DC

## 2012-09-28 MED ORDER — GLUCERNA 1.2 CAL PO LIQD
1000.0000 mL | ORAL | Status: DC
  Administered 2012-09-28: 1000 mL
  Filled 2012-09-28 (×5): qty 1000

## 2012-09-28 MED ORDER — INSULIN ASPART 100 UNIT/ML ~~LOC~~ SOLN: 4.0000 [IU] | Freq: Three times a day (TID) | SUBCUTANEOUS | Status: DC

## 2012-09-28 NOTE — Progress Notes (Signed)
NUTRITION FOLLOW UP  Intervention:   1. D/c Jevity 1.2 2. Initiate Glucerna 1.2 @ 55 ml/hr, this will provide 1584 kcal, 79 gm protein, and 1063 ml free water 3. Continue free water, 100 ml q6h, for a total of 1463 ml daily. Additional free water needs being met with IVF.   Nutrition Dx:   Malnutrition related to cancer and cancer related treatments as evidenced by 7% weight loss x 1 month and estimated intake < 75% of her needs for > 1 month, ongoing  Goal:   Meet >/=90% estimated nutrition needs. Met  Monitor:   Tolerance of EN, diet advance, weight trends, labs   Assessment:   Pt continues remains NPO with TF to meet nutrition needs. Per SLP notes, pt needs MBS for possible advancement of diet though will likely remain unable to meet nutrition needs with PO intake. Family appears to not have made a decision about a PEG tube if needed.   Currently with NG tube with Jevity 1.2 @ goal rate of 55 ml/hr providing 1584 kcal, 73 gm protein, and 1065 ml free water. Additional 100 ml free water q 6 h for a total of 1465 ml daily.  Pt with elevated blood sugars. Will change to Glucerna formula, family aware/agreeable.   Height: Ht Readings from Last 1 Encounters:  09/22/12 4\' 9"  (1.448 m)    Weight Status:   Wt Readings from Last 1 Encounters:  09/28/12 148 lb 13 oz (67.5 kg)    Re-estimated needs:  Kcal: 1400-1600  Protein: 65-75 gm  Fluid: >/= 1.5 L  Skin: reddened area on sacrum   Diet Order: NPO  No intake or output data in the 24 hours ending 09/28/12 1059  Last BM: 8/4   Labs:   Recent Labs Lab 09/26/12 0500 09/27/12 0600 09/27/12 1213 09/28/12 0505  NA 133* 135 135 136  K 3.1* 3.6 3.5 3.4*  CL 96 99 99 99  CO2 26 26 27 26   BUN 13 10 11 13   CREATININE 0.25* 0.24* 0.27* 0.24*  CALCIUM 8.1* 8.2* 8.2* 8.1*  MG  --  1.9  --   --   GLUCOSE 256* 292* 295* 290*    CBG (last 3)   Recent Labs  09/28/12 0004 09/28/12 0430 09/28/12 0759  GLUCAP 263* 269*  302*    Scheduled Meds: . antiseptic oral rinse  15 mL Mouth Rinse q12n4p  . aspirin  325 mg Per Tube Daily  . chlorhexidine  15 mL Mouth Rinse BID  . free water  100 mL Per Tube Q6H  . insulin aspart  6 Units Subcutaneous Q4H  . metoprolol tartrate  25 mg Per Tube BID  . piperacillin-tazobactam  3.375 g Intravenous Q8H  . simvastatin  40 mg Per Tube q1800    Continuous Infusions: . sodium chloride 10 mL/hr at 09/25/12 1545  . feeding supplement (JEVITY 1.2 CAL) 1,000 mL (09/28/12 0625)    Clarene Duke RD, LDN Pager [phone] After Hours pager [phone]

## 2012-09-28 NOTE — Progress Notes (Signed)
[facility] PROGRESS NOTE  Rachel Benton ZOX:[id number] DOB: 26-Dec-1940 DOA: 09/21/2012 PCP: Elby Showers, MD  Assessment/Plan: tachypnea  -likely due to Cheyne-Stokes respirations from the patient's acute stroke  -Check ABG 7.52/31/67/26  -chest x-ray--increased LLL, infiltrate -Check EKG--sinus rhythm without ST to T wave change  -check TSH-2.447 -lactate 2.2 LLL infiltrate/atelectasis -in light of pt hypoxemia, tachypnea and dysphagia, start empiric zosyn (8/10) Right MCA infarct with cytotoxic cerebral edema and acute to subacute lacunar infarct in the posterior left MCA territory (right cerebellar and right thalamic infarcts)  -patient's mentation waxes and wanes throughout the day with periods of alertness and somnolence -Patient arouses answers questions appropriately felt to be thromboembolic secondary to occluded right M1 noted on MRA - no signif stenosis on carotids - no further w/u indicated per Stroke Team  -continue ASA for CVA prophy  Dysphagia  -discussed with speech therapy-->repeat MBS 09/29/12 -failed her bedside swallow screen - feeding tube placed 8/6 for short term - -discussed need to make more long term decisions regarding PEG vs/ comfort care by early next week with family  -not yet ready to comment on what the pt would desire should she not prove to recover to point of oral intake  -daughter at bedside states "I thought we were going to give her 7-10 days before deciding on a feeding tube"  DIABETES MELLITUS, TYPE II  -hyperglycemia has been exacerbated by the patient's tube feedings -Jevity has been changed to Glucerna (09/28/2012) -Start NovoLog sliding scale and 4 units NovoLog every 4 hours Hypokalemia  -replete -check mag UTI  Urine cx not obtained unfortunately - completed 5 days of empiric ceftriaxone HYPERLIPIDEMIA  On statin  ANXIETY, MILD  -currently, patient denies any feelings of anxiety or jitteriness  On chronic benzo as outpt -  clarified with family today that patient only uses "every now and then" - stopped scheduled dosing and follow clinically with prn only  HYPERTENSION  BP currently reasonably controlled -continue metoprolol tartrate 25 mg twice a day  Tachycardia/ocasional bursts of ST w/ frequent PAC's vs PAF  Volume depletion v/s anxiety - also likely BB withdrawal - follow clinically - appears to be improving  BACK PAIN, CHRONIC  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 Truett Perna  Protein-calorie malnutrition, severe  -Tube feeding ongoing via PANDA tube  -tolerating TF  -repeat abdominal x-ray reveals that tube remains in the stomach antrum  Code Status: FULL  Family Communication: spoke w/ two family members at bedside  Disposition Plan: transfer to 4N Telemetry - monitor for improvement in neuro status over the next 3 days - if no change will need to d/w family re: PEG + SNF  Consultants:  Neurology  Procedures:  8/5 - Cartotid dopplers - Bilateral: 1-39% ICA stenosis  8/5 - TTE - EF 55-60% - grade I DD - no obvious source of emboli  Antibiotics:  Rocephin 8/5 >> 09/28/2002 Family Communication: Granddaughter at beside  Disposition Plan: Home when medically stable        Procedures/Studies: Ct Head Wo Contrast  09/21/2012   *RADIOLOGY REPORT*  Clinical Data: 72 year old female - code stroke - altered mental status.  CT HEAD WITHOUT CONTRAST  Technique:  Contiguous axial images were obtained from the base of the skull through the vertex without contrast.  Comparison: 06/15/2008 CT and MRI  Findings: Remote right cerebellar and right thalamic infarcts noted as well as mild chronic small vessel white matter  ischemic changes.  No acute intracranial abnormalities are identified, including mass lesion or mass effect, hydrocephalus, extra-axial fluid collection, midline shift, hemorrhage, or acute infarction.  The  visualized bony calvarium is unremarkable.  IMPRESSION: No evidence of acute intracranial abnormality.  Remote right cerebellar and right thalamic infarcts and mild chronic small vessel white matter ischemic changes.   Original Report Authenticated By: Harmon Pier, M.D.   Mr Brain Wo Contrast  09/22/2012   *RADIOLOGY REPORT*  Clinical Data:  72 year old female with sudden onset left side weakness and facial droop.  Not at tPA candidate due to history of rectal cancer/rectal bleeding.  Recent diagnosis of rectal cancer. History of prior stroke affecting the left side.  Comparison: Head CT without contrast 09/21/2012.  Brain MRI/MRA 06/16/2008.  MRI HEAD WITHOUT CONTRAST  Technique: Multiplanar, multiecho pulse sequences of the brain and surrounding structures were obtained according to standard protocol without intravenous contrast.  Findings: Confluent restricted diffusion throughout much of the right MCA territory, affecting cortex and white matter.  Basal ganglia partially affected.  Overall moderate stroke size. Associated cytotoxic edema.  No midline shift or significant mass effect at this time.  No associated acute intracranial hemorrhage. Occasional small foci of restricted diffusion also in the right occipital lobe.  No posterior fossa involvement.  There could be a punctate area of restricted diffusion also in the left posterior MCA territory, left parietal lobe on series 4 image 18.  Major intracranial vascular flow voids are stable at the skull base, but the right MCA M1 segment may now be abnormal.  See MRA findings below.  Interval expected evolution of the right thalamic lacunar infarct which occurred in 2010.  Chronic right cerebellar infarct is stable.  No ventriculomegaly.  Negative pituitary, cervicomedullary junction and visualized cervical spine.  No extra-axial collection. No vasogenic cerebral edema is identified.  Bone marrow signal now is mostly abnormal in the skull and visible cervical  spine.  Marrow signal is normal in 2010.  Visualized orbit soft tissues are within normal limits.  Mild mastoid effusions greater on the right.  Stable paranasal sinuses. Negative scalp soft tissues.  IMPRESSION: 1.  Moderate sized confluent right MCA infarct with cytotoxic edema. See MRA findings below.  No associated hemorrhage or mass effect at this time. 2.  Possible superimposed acute to subacute lacunar infarct in the posterior left MCA territory. 3.  Otherwise, stable brain with expected evolution of the 2010 right thalamus lacunar infarct. 4.  Bone marrow signal has become abnormal since 2010.  This could be related to anemia due to GI bleeding or cancer therapy. Metastatic disease to bone is less likely. 5.  Mild mastoid effusions.  MRA HEAD WITHOUT CONTRAST  Technique: Angiographic images of the Circle of Willis were obtained using MRA technique without  intravenous contrast.  Findings: Artifact at the cervicomedullary junction.  Stable diminutive posterior circulation with antegrade flow in the distal vertebral arteries.  Patent PICA origins.  Patent basilar artery without stenosis.  Fetal type bilateral PCA origins re- identified.  Chronic left PCA superior division origin stenosis. Chronic tandem stenoses in the right PCA distal P1 and P2 segment, not significantly changed.  Stable antegrade flow in both ICA siphons.  No ICA stenosis. Patent carotid termini.  Bilateral ACA and left MCA origins are patent.  Diminutive or absent anterior communicating artery.  Visualized bilateral ACA branches are within normal limits.  Stable visualized left MCA branches.  Right MCA occlusion just beyond the M1 segment origin.  No distal  right MCA flow.  IMPRESSION: 1.  New right MCA occlusion just beyond its origin. 2.  Otherwise stable intracranial MRA including proximal right PCA tandem stenoses and distal left PCA stenosis.  Salient findings discussed with Dr. Leroy Kennedy by telephone at 2020 hours on 09/22/2012.    Original Report Authenticated By: Erskine Speed, M.D.   Dg Chest Port 1 View  09/27/2012   *RADIOLOGY REPORT*  Clinical Data: Tachypnea  PORTABLE CHEST - 1 VIEW  Comparison: 09/21/2012  Findings: The lower lobe airspace disease has progressed.  This may be atelectasis or pneumonia.  No heart failure.  Right lung is clear.  The feeding tube extends into the stomach with the tip of the tube not visualized.  IMPRESSION: Progression of left lower lobe atelectasis/infiltrate.   Original Report Authenticated By: Janeece Riggers, M.D.   Dg Chest Port 1 View  09/21/2012   *RADIOLOGY REPORT*  Clinical Data: Cough, weakness, question stroke  PORTABLE CHEST - 1 VIEW  Comparison: Portable exam 1627 hours compared to 06/15/2008  Findings: Enlargement of cardiac silhouette. Tortuous aorta with atherosclerotic calcification at arch. Mediastinal contours and pulmonary vascularity normal. Lungs clear. No pleural effusion or pneumothorax. Bones demineralized.  IMPRESSION: Mild enlargement of cardiac silhouette. No acute abnormalities.   Original Report Authenticated By: Ulyses Southward, M.D.   Dg Abd Portable 1v  09/26/2012   *RADIOLOGY REPORT*  Clinical Data: Feeding tube placement  PORTABLE ABDOMEN - 1 VIEW  Comparison: September 23, 2012  Findings: Tip of the feeding tube is in the antrum of the stomach. Moderate stool in the ascending colon.  Mild distention of the colon.  IMPRESSION: Feeding tube tip is in the antrum of the stomach.   Original Report Authenticated By: Jolaine Click, M.D.   Dg Abd Portable 1v  09/23/2012   *RADIOLOGY REPORT*  Clinical Data: Feeding tube placement.  PORTABLE ABDOMEN - 1 VIEW  Comparison: None.  Findings: Single portable view of the abdomen submitted.  There is nonspecific nonobstructive bowel gas pattern.  There is a NG feeding tube with tip in distal gastric region.  IMPRESSION: NG feeding tube with tip in distal gastric region.   Original Report Authenticated By: Natasha Mead, M.D.   Mr Mra Head/brain Wo  Cm  09/22/2012   *RADIOLOGY REPORT*  Clinical Data:  72 year old female with sudden onset left side weakness and facial droop.  Not at tPA candidate due to history of rectal cancer/rectal bleeding.  Recent diagnosis of rectal cancer. History of prior stroke affecting the left side.  Comparison: Head CT without contrast 09/21/2012.  Brain MRI/MRA 06/16/2008.  MRI HEAD WITHOUT CONTRAST  Technique: Multiplanar, multiecho pulse sequences of the brain and surrounding structures were obtained according to standard protocol without intravenous contrast.  Findings: Confluent restricted diffusion throughout much of the right MCA territory, affecting cortex and white matter.  Basal ganglia partially affected.  Overall moderate stroke size. Associated cytotoxic edema.  No midline shift or significant mass effect at this time.  No associated acute intracranial hemorrhage. Occasional small foci of restricted diffusion also in the right occipital lobe.  No posterior fossa involvement.  There could be a punctate area of restricted diffusion also in the left posterior MCA territory, left parietal lobe on series 4 image 18.  Major intracranial vascular flow voids are stable at the skull base, but the right MCA M1 segment may now be abnormal.  See MRA findings below.  Interval expected evolution of the right thalamic lacunar infarct which occurred in 2010.  Chronic  right cerebellar infarct is stable.  No ventriculomegaly.  Negative pituitary, cervicomedullary junction and visualized cervical spine.  No extra-axial collection. No vasogenic cerebral edema is identified.  Bone marrow signal now is mostly abnormal in the skull and visible cervical spine.  Marrow signal is normal in 2010.  Visualized orbit soft tissues are within normal limits.  Mild mastoid effusions greater on the right.  Stable paranasal sinuses. Negative scalp soft tissues.  IMPRESSION: 1.  Moderate sized confluent right MCA infarct with cytotoxic edema. See MRA  findings below.  No associated hemorrhage or mass effect at this time. 2.  Possible superimposed acute to subacute lacunar infarct in the posterior left MCA territory. 3.  Otherwise, stable brain with expected evolution of the 2010 right thalamus lacunar infarct. 4.  Bone marrow signal has become abnormal since 2010.  This could be related to anemia due to GI bleeding or cancer therapy. Metastatic disease to bone is less likely. 5.  Mild mastoid effusions.  MRA HEAD WITHOUT CONTRAST  Technique: Angiographic images of the Circle of Willis were obtained using MRA technique without  intravenous contrast.  Findings: Artifact at the cervicomedullary junction.  Stable diminutive posterior circulation with antegrade flow in the distal vertebral arteries.  Patent PICA origins.  Patent basilar artery without stenosis.  Fetal type bilateral PCA origins re- identified.  Chronic left PCA superior division origin stenosis. Chronic tandem stenoses in the right PCA distal P1 and P2 segment, not significantly changed.  Stable antegrade flow in both ICA siphons.  No ICA stenosis. Patent carotid termini.  Bilateral ACA and left MCA origins are patent.  Diminutive or absent anterior communicating artery.  Visualized bilateral ACA branches are within normal limits.  Stable visualized left MCA branches.  Right MCA occlusion just beyond the M1 segment origin.  No distal right MCA flow.  IMPRESSION: 1.  New right MCA occlusion just beyond its origin. 2.  Otherwise stable intracranial MRA including proximal right PCA tandem stenoses and distal left PCA stenosis.  Salient findings discussed with Dr. Leroy Kennedy by telephone at 2020 hours on 09/22/2012.   Original Report Authenticated By: Erskine Speed, M.D.         Subjective: Patient is somnolent but arouses easily. Denies any headache, chest pain, shortness breath, vomiting, diarrhea, abdominal pain. Patient is tolerating nasogastric feeds.  Objective: Filed Vitals:   09/28/12 0447  09/28/12 0600 09/28/12 0930 09/28/12 1415  BP:  131/70 142/82 113/73  Pulse:  100 108 66  Temp:  97.6 F (36.4 C) 98.3 F (36.8 C) 98.6 F (37 C)  TempSrc:   Oral Axillary  Resp: 24 20 20 20   Height:      Weight: 65.4 kg (144 lb 2.9 oz) 67.5 kg (148 lb 13 oz)    SpO2:  95% 96% 96%   No intake or output data in the 24 hours ending 09/28/12 1625 Weight change: 0.7 kg (1 lb 8.7 oz) Exam:   General:  Pt is alert, follows commands appropriately, not in acute distress  HEENT: No icterus, No thrush,  Notus/AT  Cardiovascular: RRR, S1/S2, no rubs, no gallops  Respiratory: diminished breath sounds at bases, no wheeze  Abdomen: Soft/+BS, non tender, non distended, no guarding  Extremities: No edema, No lymphangitis, No petechiae, No rashes, no synovitis  Data Reviewed: Basic Metabolic Panel:  Recent Labs Lab 09/24/12 0500 09/26/12 0500 09/27/12 0600 09/27/12 1213 09/28/12 0505  NA 132* 133* 135 135 136  K 3.8 3.1* 3.6 3.5 3.4*  CL 96  96 99 99 99  CO2 22 26 26 27 26   GLUCOSE 159* 256* 292* 295* 290*  BUN 6 13 10 11 13   CREATININE 0.27* 0.25* 0.24* 0.27* 0.24*  CALCIUM 8.3* 8.1* 8.2* 8.2* 8.1*  MG  --   --  1.9  --   --    Liver Function Tests: No results found for this basename: AST, ALT, ALKPHOS, BILITOT, PROT, ALBUMIN,  in the last 168 hours No results found for this basename: LIPASE, AMYLASE,  in the last 168 hours No results found for this basename: AMMONIA,  in the last 168 hours CBC:  Recent Labs Lab 09/23/12 0534 09/24/12 0500 09/26/12 0500 09/27/12 0600 09/28/12 0505  WBC 3.9* 4.1 3.4* 3.1* 3.9*  HGB 9.2* 10.6* 9.4* 10.3* 9.1*  HCT 28.6* 32.8* 29.8* 33.5* 28.9*  MCV 81.3 81.4 83.0 82.7 82.6  PLT 85* 87* 135* PLATELET CLUMPS NOTED ON SMEAR, COUNT APPEARS DECREASED 160   Cardiac Enzymes:  Recent Labs Lab 09/27/12 1213 09/27/12 1815 09/27/12 2223  TROPONINI <0.30 <0.30 <0.30   BNP: No components found with this basename: POCBNP,  CBG:  Recent  Labs Lab 09/27/12 2000 09/28/12 0004 09/28/12 0430 09/28/12 0759 09/28/12 1134  GLUCAP 325* 263* 269* 302* 236*    Recent Results (from the past 240 hour(s))  MRSA PCR SCREENING     Status: None   Collection Time    09/22/12  3:33 AM      Result Value Range Status   MRSA by PCR NEGATIVE  NEGATIVE Final   Comment:            The GeneXpert MRSA Assay (FDA     approved for NASAL specimens     only), is one component of a     comprehensive MRSA colonization     surveillance program. It is not     intended to diagnose MRSA     infection nor to guide or     monitor treatment for     MRSA infections.     Scheduled Meds: . antiseptic oral rinse  15 mL Mouth Rinse q12n4p  . aspirin  325 mg Per Tube Daily  . chlorhexidine  15 mL Mouth Rinse BID  . free water  100 mL Per Tube Q6H  . insulin aspart  0-15 Units Subcutaneous Q4H  . insulin aspart  4 Units Subcutaneous Q4H  . metoprolol tartrate  25 mg Per Tube BID  . piperacillin-tazobactam  3.375 g Intravenous Q8H  . simvastatin  40 mg Per Tube q1800   Continuous Infusions: . sodium chloride 10 mL/hr at 09/25/12 1545  . feeding supplement (GLUCERNA 1.2 CAL) 1,000 mL (09/28/12 1444)     Corey Laski, DO  [facility] Pager [phone]  If 7PM-7AM, please contact night-coverage [email or web address] Password [email or web address] 09/28/2012, 4:25 PM   LOS: 7 days

## 2012-09-28 NOTE — Progress Notes (Signed)
Inpatient Diabetes Program Recommendations  AACE/ADA: New Consensus Statement on Inpatient Glycemic Control (2013)  Target Ranges:  Prepandial:   less than 140 mg/dL      Peak postprandial:   less than 180 mg/dL (1-2 hours)      Critically ill patients:  140 - 180 mg/dL   Results for DAMITA, EPPARD (MRN [id number]) as of 09/28/2012 11:05  Ref. Range 09/27/2012 05:14 09/27/2012 08:03 09/27/2012 12:05 09/27/2012 16:07 09/27/2012 20:00 09/28/2012 00:04 09/28/2012 04:30 09/28/2012 07:59  Glucose-Capillary Latest Range: 70-99 mg/dL 409 (H) 811 (H) 914 (H) 250 (H) 325 (H) 263 (H) 269 (H) 302 (H)   Inpatient Diabetes Program Recommendations Correction (SSI): Please order CBGs Q4H with Novolog moderate correction scale. Insulin - Meal Coverage: Please continue tube feeding coverage; recommend Novolog 5 units Q4H (along with Novolog correction insulin).  Note: Blood glucose ranged from 250-325 mg/dl on 7/82/95 and has ranged from 263-302 mg/dl thus far today.  Note that patient remains NPO and is ordered Jevity 55 ml/hr tube feeding.  Currently patient is only ordered to receive Novolog 6 units Q4H for management of inpatient glycemic control.  Please order CBGs Q4H with Novolog moderate correction scale and decrease tube feeding coverage to Novolog 5 units Q4H (to be given along with correction insulin).  If recommendations ordered and glucose continues to be consistently elevated, may want to consider ordering low dose basal insulin.  Will continue to follow.  Thanks, Orlando Penner, RN, MSN, CCRN Diabetes Coordinator Inpatient Diabetes Program [phone]

## 2012-09-28 NOTE — Progress Notes (Signed)
Speech Language Pathology Dysphagia Treatment Patient Details Name: Rachel Benton MRN: [id number] DOB: 1940/06/23 Today's Date: 09/28/2012 Time: 7829-5621 SLP Time Calculation (min): 32 min  Assessment / Plan / Recommendation Clinical Impression  Pt more responsive today but still requiring max cues to transit boluses. Pts function is clearly improving. Pts plan awaiting decision on nutrition. Though probability that pt would consume appropriate amount of PO at this time to nutritionally support herself is poor, will proceed with objective study to determine safety with PO and establish prognosis for improved intake to aid in decision making. MBS planned for tomorrow am.     Diet Recommendation  Continue with Current Diet: NPO    SLP Plan MBS   Pertinent Vitals/Pain NA   Swallowing Goals  SLP Swallowing Goals Goal #3: Pt. will maintain alertness and initiate pharyngeal swallow consistently with min verbal cues to establish goals of care regarding po's. Swallow Study Goal #3 - Progress: Progressing toward goal  General Temperature Spikes Noted: No Respiratory Status: Room air Behavior/Cognition: Alert;Requires cueing;Decreased sustained attention;Cooperative Oral Cavity - Dentition: Edentulous Patient Positioning: Upright in bed  Oral Cavity - Oral Hygiene Patient is HIGH RISK - Oral Care Protocol followed (see row info): Yes   Dysphagia Treatment Treatment focused on: Upgraded PO texture trials;Patient/family/caregiver education Family/Caregiver Educated: grandaughter Treatment Methods/Modalities: Skilled observation Patient observed directly with PO's: Yes Type of PO's observed: Dysphagia 1 (puree);Ice chips Feeding: Total assist Liquids provided via: Cup Oral Phase Signs & Symptoms: Prolonged bolus formation;Prolonged oral phase Pharyngeal Phase Signs & Symptoms: Suspected delayed swallow initiation Type of cueing: Verbal;Tactile Amount of cueing: Maximal   GO     Rachel Ditty, MA CCC-SLP [phone]  Rachel Benton 09/28/2012, 9:36 AM

## 2012-09-29 ENCOUNTER — Encounter (HOSPITAL_COMMUNITY): Payer: Self-pay | Admitting: Radiology

## 2012-09-29 ENCOUNTER — Inpatient Hospital Stay (HOSPITAL_COMMUNITY): Payer: Medicare Other

## 2012-09-29 LAB — GLUCOSE, CAPILLARY
Glucose-Capillary: 201 mg/dL — ABNORMAL HIGH (ref 70–99)
Glucose-Capillary: 132 mg/dL — ABNORMAL HIGH (ref 70–99)
Glucose-Capillary: 174 mg/dL — ABNORMAL HIGH (ref 70–99)

## 2012-09-29 LAB — BASIC METABOLIC PANEL
BUN: 17 mg/dL (ref 6–23)
Calcium: 8.3 mg/dL — ABNORMAL LOW (ref 8.4–10.5)
GFR calc non Af Amer: >90 mL/min (ref >90)
Glucose, Bld: 168 mg/dL — ABNORMAL HIGH (ref 70–99)
Sodium: 137 mEq/L (ref 135–145)

## 2012-09-29 NOTE — Progress Notes (Signed)
Occupational Therapy Treatment Patient Details Name: Rachel Benton MRN: [id number] DOB: 1940-06-15 Today's Date: 09/29/2012 Time: 7829-5621 OT Time Calculation (min): 33 min  OT Assessment / Plan / Recommendation  History of present illness Pt admit with R CVA.  Recently completed chemo and radiation for rectal CA.     OT comments  Pt progressing with therapy and demonstrated incr activity tolerance this session. Session focused on core strengthening and postural alignment. Pt provided neck PROM and now aligned with neck in neutral in chair. Pt with eyes in a rt gaze.  Follow Up Recommendations  SNF (pt progressing with sitting balance- )    Barriers to Discharge       Equipment Recommendations  Hospital bed;Wheelchair (measurements OT);Wheelchair cushion (measurements OT)    Recommendations for Other Services    Frequency Min 3X/week   Progress towards OT Goals Progress towards OT goals: Progressing toward goals  Plan Discharge plan remains appropriate    Precautions / Restrictions Precautions Precautions: Fall  High risk for skin break down without repositioning every 2 hours  Pertinent Vitals/Pain No pain reported     ADL  Eating/Feeding: NPO (panda- barium noted in mouth) Where Assessed - Eating/Feeding: Chair Grooming: Wash/dry face;Min guard Where Assessed - Grooming: Supported sitting (wiping mouth with Rt hand and max cues ) Lower Body Dressing: Maximal assistance Where Assessed - Lower Body Dressing: Supported sitting (don socks) ADL Comments: Pt sleeping on arrival. pt needed tactile input on the right side of the body for arousal and name calling. pt presented with socks and pt able to verbalize socks and color red iwth questioning. Pt provided sock and cue to don. Pt initiated with Rt Le and terminating task and decr attention. Pt again given arousal cue. Pt don sock with tactile auditory cues. Pt provided second sock and asked to don. pt attempting to place second  sock on right foot again. pt with poor awarenss to the left side of body and reports sensation on that side as "numb". Pt total (A) to hold Left foot against right tight for tactile input. Pt with poor visual attention initialliy. Pt needed tactile input and verbal cues to coach toward midline vision. pt demonstrated during session ability to visually attend to left visual fields. Pt locating stickers during session on therapist and pulling off with Rt UE. pt with LT shoulder 1 digit subluxation. Pt with reposition of Lt scapula and humerus with incr ability to perform neck extension for visual attention. Pt with right gaze preference. Pt verbalizing several times during session. Pt demonstrates cognitive defi cits. Pt at times demonstrating with hand the answer to a question instead of verbalizing. (showing 1 finger) Pt using Rt Ue to pull against therapist to achieve upright sitting min (A) for 30 seconds but unable to sustain. Pt sitting at edge of chair unsupported with therapist for 20 minutes working on core strengthening, postural alignment and visual attention. Pt playing thumb war with therapis to work on tracking in a midline gaze. Pt giving high five just past midline. pt unable to locate Left hand. Pt needed Left hand placed in midline to visually acknowledge.     OT Diagnosis:    OT Problem List:   OT Treatment Interventions:     OT Goals(current goals can now be found in the care plan section) Acute Rehab OT Goals Patient Stated Goal: to go home OT Goal Formulation: Patient unable to participate in goal setting Time For Goal Achievement: 10/06/12 Potential to Achieve Goals:  Good ADL Goals Pt Will Perform Grooming: with mod assist;sitting Additional ADL Goal #1: Pt will perform supine to sit at EOB with mod assist in prep for ADL. Additional ADL Goal #2: Pt will sit EOB with min guard assist  x 10 minutes while engaged in functional activity.  Visit Information  Last OT Received On:  09/29/12 Assistance Needed: +2 PT/OT Co-Evaluation/Treatment: Yes History of Present Illness: Pt admit with R CVA.  Recently completed chemo and radiation for rectal CA.      Subjective Data      Prior Functioning       Cognition  Cognition Arousal/Alertness: Awake/alert Behavior During Therapy: Flat affect (needing arousal cues initially) Overall Cognitive Status: History of cognitive impairments - at baseline Memory: Decreased short-term memory    Mobility  Bed Mobility Bed Mobility: Not assessed (in chair with hoyer pad) Transfers Transfers: Not assessed    Exercises      Balance Static Sitting Balance Static Sitting - Balance Support: Feet supported Static Sitting - Level of Assistance: 1: +1 Total assist Static Sitting - Comment/# of Minutes: sitting at edge of chair for 20 minutes. Pt able to sustain unsupported sitting for brief trial attempts pulling with RT UE   End of Session OT - End of Session Activity Tolerance: Patient tolerated treatment well Patient left: in chair;with call bell/phone within reach Nurse Communication: Mobility status;Precautions;Need for lift equipment  GO     Lucile Shutters 09/29/2012, 5:03 PM Pager: [phone]

## 2012-09-29 NOTE — Progress Notes (Signed)
Physical Therapy Treatment Patient Details Name: Rachel Benton MRN: [id number] DOB: 1941/01/01 Today's Date: 09/29/2012 Time: 4098-1191 PT Time Calculation (min): 33 min  PT Assessment / Plan / Recommendation  History of Present Illness Pt admit with R CVA.  Recently completed chemo and radiation for rectal CA.     PT Comments   Patient seen in co-tx with OT working on trunk and head/neck control, postural endurance, visual tracking, engaging in left side awareness and functional tasks to promote all of the above.  She needed multimodal cues to engage initially due to lethargy, but was interactive and participating more throughout the session.  Agree with STSNF level rehab at d/c.  Follow Up Recommendations  Supervision/Assistance - 24 hour;SNF           Equipment Recommendations  Other (comment) (TBA)       Frequency Min 3X/week   Progress towards PT Goals Progress towards PT goals: Progressing toward goals  Plan Current plan remains appropriate    Precautions / Restrictions Precautions Precautions: Fall Precaution Comments: High risk for skin break down without repositioning every 2 hours   Pertinent Vitals/Pain Denies pain    Mobility  Bed Mobility Bed Mobility: Not assessed Details for Bed Mobility Assistance: up in recliner via lift equipment with nursing Modified Rankin (Stroke Patients Only) Pre-Morbid Rankin Score: Moderately severe disability Modified Rankin: Severe disability      PT Goals (current goals can now be found in the care plan section) Acute Rehab PT Goals Patient Stated Goal: to go home  Visit Information  Last PT Received On: 09/29/12 Assistance Needed: +2 PT/OT Co-Evaluation/Treatment: Yes History of Present Illness: Pt admit with R CVA.  Recently completed chemo and radiation for rectal CA.      Subjective Data  Patient Stated Goal: to go home   Cognition  Cognition Arousal/Alertness: Awake/alert Behavior During Therapy: Flat  affect Overall Cognitive Status: History of cognitive impairments - at baseline Memory: Decreased short-term memory    Balance  Static Sitting Balance Static Sitting - Balance Support: Feet supported Static Sitting - Level of Assistance: 1: +1 Total assist Static Sitting - Comment/# of Minutes: sitting at edge of chair 20 minutes working on reaching and supporting self with right UE; activities performed to encourage midline gaze and left gaze, tracking, left head movements, head and trunk control; facilitation to upper trunk and left scapula for increased upright posture and head control  End of Session PT - End of Session Activity Tolerance: Patient limited by fatigue;Patient limited by lethargy Patient left: in chair;with call bell/phone within reach;with family/visitor present   GP     [facility] 09/29/2012, 5:17 PM Sheran Lawless, PT [phone] 09/29/2012

## 2012-09-29 NOTE — H&P (Signed)
Referring Physician: Dr. Arbutus Leas HPI: Rachel Benton is an 72 y.o. female who has PMHx of rectal cancer and rectal bleeding, patient receiving therapy. Patient also with a history of CVA and now presented on 09/21/12 with left hemiparesis, MRI revealed moderate sized confluent right MCA infarct. Patient with dysphagia and NG tube in place. Request for percutaneous gastric tube placement prior to SNF.   Past Medical History:  Past Medical History  Diagnosis Date  . Hypertension   . Diabetes mellitus without complication   . Hyperlipidemia   . Back pain   . Insomnia   . Hyperlipidemia   . Stroke 2009  . Hemorrhoids   . History of recent fall 07/05/12  . Cancer 07/21/12    rectal, lymphadenopathy    Past Surgical History:  Past Surgical History  Procedure Laterality Date  . Hip arthroplasty Left 2009    fall injury  . Abdominal hysterectomy      TAH, > 37 yrs ago  . Bladder suspension      many yrs ago    Family History:  Family History  Problem Relation Age of Onset  . Cancer Sister     lung  . Cancer Daughter     cervical  . Diabetes Mother     Social History:  reports that she has never smoked. She has never used smokeless tobacco. She reports that she does not drink alcohol or use illicit drugs.  Allergies:  Allergies  Allergen Reactions  . Ace Inhibitors   . Fish Oil     REACTION: GI  . Glipizide     REACTION: hypoglycemia  . Ibuprofen   . Rosiglitazone Maleate     REACTION: edema  . Sulfonamide Derivatives       Medication List    ASK your doctor about these medications       Aloe Vera Liqd  Take 60 mLs by mouth 3 (three) times daily. Mixed in juice     ALPRAZolam 0.25 MG tablet  Commonly known as:  XANAX  Take 0.25 mg by mouth 3 (three) times daily as needed for anxiety.     capecitabine 500 MG tablet  Commonly known as:  XELODA  - Take #3 (1500mg ) po every am & #2 (1000mg ) po every pm = 2500 mg total daily dose  - Take on days of radiation therapy  only M-F     emollient cream  Commonly known as:  BIAFINE  Apply 1 application topically See admin instructions. To bottom after every bathroom trip     fentaNYL 25 MCG/HR patch  Commonly known as:  DURAGESIC - dosed mcg/hr  Place 1 patch onto the skin every 3 (three) days.     ferrous sulfate 325 (65 FE) MG tablet  Take 325 mg by mouth 2 (two) times daily.     fluconazole 100 MG tablet  Commonly known as:  DIFLUCAN  Take 1 tablet (100 mg total) by mouth daily.     metFORMIN 1000 MG tablet  Commonly known as:  GLUCOPHAGE  Take 1,000 mg by mouth 2 (two) times daily with a meal.     metFORMIN 1000 MG tablet  Commonly known as:  GLUCOPHAGE  Take 500 mg by mouth daily after lunch.     ondansetron 4 MG tablet  Commonly known as:  ZOFRAN  Take 4 mg by mouth every 8 (eight) hours as needed for nausea.     OVER THE COUNTER MEDICATION  Radiology cream to radiation burns on  her mid section three times daily     polyethylene glycol packet  Commonly known as:  MIRALAX / GLYCOLAX  Take 17 g by mouth daily as needed (constipation).     pravastatin 40 MG tablet  Commonly known as:  PRAVACHOL  Take 40 mg by mouth daily.     prochlorperazine 10 MG tablet  Commonly known as:  COMPAZINE  Take 10 mg by mouth every 6 (six) hours as needed (nausea).        Please HPI for pertinent positives, otherwise complete 10 system ROS negative.  Physical Exam: BP 120/80  Pulse 93  Temp(Src) 98.7 F (37.1 C) (Oral)  Resp 20  Ht 4\' 9"  (1.448 m)  Wt 148 lb 13 oz (67.5 kg)  BMI 32.19 kg/m2  SpO2 96% Body mass index is 32.19 kg/(m^2).   General Appearance:  Alert, cooperative, no distress, appears stated age  Head:  Normocephalic, without obvious abnormality, atraumatic  Lungs:   Clear to auscultation bilaterally, no w/r/r, respirations unlabored without use of accessory muscles.  Chest Wall:  No tenderness or deformity  Heart:  Regular rate and rhythm, S1, S2 normal, no murmur, rub or  gallop.  Abdomen:   Soft, non-tender, non distended.  Extremities: Extremities normal, atraumatic, no cyanosis or edema  Pulses: 1+ and symmetric   Results for orders placed during the hospital encounter of 09/21/12 (from the past 48 hour(s))  TROPONIN I     Status: None   Collection Time    09/27/12  6:15 PM      Result Value Range   Troponin I <0.30  <0.30 ng/mL   Comment:            Due to the release kinetics of cTnI,     a negative result within the first hours     of the onset of symptoms does not rule out     myocardial infarction with certainty.     If myocardial infarction is still suspected,     repeat the test at appropriate intervals.  GLUCOSE, CAPILLARY     Status: Abnormal   Collection Time    09/27/12  8:00 PM      Result Value Range   Glucose-Capillary 325 (*) 70 - 99 mg/dL  TROPONIN I     Status: None   Collection Time    09/27/12 10:23 PM      Result Value Range   Troponin I <0.30  <0.30 ng/mL   Comment:            Due to the release kinetics of cTnI,     a negative result within the first hours     of the onset of symptoms does not rule out     myocardial infarction with certainty.     If myocardial infarction is still suspected,     repeat the test at appropriate intervals.  GLUCOSE, CAPILLARY     Status: Abnormal   Collection Time    09/28/12 12:04 AM      Result Value Range   Glucose-Capillary 263 (*) 70 - 99 mg/dL  GLUCOSE, CAPILLARY     Status: Abnormal   Collection Time    09/28/12  4:30 AM      Result Value Range   Glucose-Capillary 269 (*) 70 - 99 mg/dL  BASIC METABOLIC PANEL     Status: Abnormal   Collection Time    09/28/12  5:05 AM      Result Value Range  Sodium 136  135 - 145 mEq/L   Potassium 3.4 (*) 3.5 - 5.1 mEq/L   Chloride 99  96 - 112 mEq/L   CO2 26  19 - 32 mEq/L   Glucose, Bld 290 (*) 70 - 99 mg/dL   BUN 13  6 - 23 mg/dL   Creatinine, Ser 1.61 (*) 0.50 - 1.10 mg/dL   Calcium 8.1 (*) 8.4 - 10.5 mg/dL   GFR calc non Af  Amer >90  >90 mL/min   GFR calc Af Amer >90  >90 mL/min   Comment:            The eGFR has been calculated     using the CKD EPI equation.     This calculation has not been     validated in all clinical     situations.     eGFR's persistently     <90 mL/min signify     possible Chronic Kidney Disease.  CBC     Status: Abnormal   Collection Time    09/28/12  5:05 AM      Result Value Range   WBC 3.9 (*) 4.0 - 10.5 K/uL   RBC 3.50 (*) 3.87 - 5.11 MIL/uL   Hemoglobin 9.1 (*) 12.0 - 15.0 g/dL   HCT 09.6 (*) 04.5 - 40.9 %   MCV 82.6  78.0 - 100.0 fL   MCH 26.0  26.0 - 34.0 pg   MCHC 31.5  30.0 - 36.0 g/dL   RDW 81.1 (*) 91.4 - 78.2 %   Platelets 160  150 - 400 K/uL  GLUCOSE, CAPILLARY     Status: Abnormal   Collection Time    09/28/12  7:59 AM      Result Value Range   Glucose-Capillary 302 (*) 70 - 99 mg/dL  GLUCOSE, CAPILLARY     Status: Abnormal   Collection Time    09/28/12 11:34 AM      Result Value Range   Glucose-Capillary 236 (*) 70 - 99 mg/dL  GLUCOSE, CAPILLARY     Status: Abnormal   Collection Time    09/28/12  4:08 PM      Result Value Range   Glucose-Capillary 259 (*) 70 - 99 mg/dL  GLUCOSE, CAPILLARY     Status: Abnormal   Collection Time    09/28/12  7:48 PM      Result Value Range   Glucose-Capillary 267 (*) 70 - 99 mg/dL  GLUCOSE, CAPILLARY     Status: Abnormal   Collection Time    09/28/12 11:57 PM      Result Value Range   Glucose-Capillary 201 (*) 70 - 99 mg/dL  GLUCOSE, CAPILLARY     Status: Abnormal   Collection Time    09/29/12  3:52 AM      Result Value Range   Glucose-Capillary 155 (*) 70 - 99 mg/dL  BASIC METABOLIC PANEL     Status: Abnormal   Collection Time    09/29/12  5:35 AM      Result Value Range   Sodium 137  135 - 145 mEq/L   Potassium 3.7  3.5 - 5.1 mEq/L   Chloride 101  96 - 112 mEq/L   CO2 24  19 - 32 mEq/L   Glucose, Bld 168 (*) 70 - 99 mg/dL   BUN 17  6 - 23 mg/dL   Creatinine, Ser 9.56 (*) 0.50 - 1.10 mg/dL   Calcium  8.3 (*) 8.4 - 10.5 mg/dL   GFR  calc non Af Amer >90  >90 mL/min   GFR calc Af Amer >90  >90 mL/min   Comment:            The eGFR has been calculated     using the CKD EPI equation.     This calculation has not been     validated in all clinical     situations.     eGFR's persistently     <90 mL/min signify     possible Chronic Kidney Disease.  MAGNESIUM     Status: None   Collection Time    09/29/12  5:35 AM      Result Value Range   Magnesium 2.1  1.5 - 2.5 mg/dL  GLUCOSE, CAPILLARY     Status: Abnormal   Collection Time    09/29/12  8:50 AM      Result Value Range   Glucose-Capillary 132 (*) 70 - 99 mg/dL   Comment 1 Documented in Chart     Comment 2 Notify RN    GLUCOSE, CAPILLARY     Status: Abnormal   Collection Time    09/29/12 12:12 PM      Result Value Range   Glucose-Capillary 157 (*) 70 - 99 mg/dL   Comment 1 Documented in Chart     Comment 2 Notify RN     Dg Swallowing Func-speech Pathology  09/29/2012   Riley Nearing Deblois, CCC-SLP     09/29/2012  1:48 PM Objective Swallowing Evaluation: Modified Barium Swallowing Study   Patient Details  Name: NINNIE FEIN MRN: [id number] Date of Birth: March 31, 1940  Today's Date: 09/29/2012 Time: 1130-1200 SLP Time Calculation (min): 30 min  Past Medical History:  Past Medical History  Diagnosis Date  . Hypertension   . Diabetes mellitus without complication   . Hyperlipidemia   . Back pain   . Insomnia   . Hyperlipidemia   . Stroke 2009  . Hemorrhoids   . History of recent fall 07/05/12  . Cancer 07/21/12    rectal, lymphadenopathy   Past Surgical History:  Past Surgical History  Procedure Laterality Date  . Hip arthroplasty Left 2009    fall injury  . Abdominal hysterectomy      TAH, > 37 yrs ago  . Bladder suspension      many yrs ago   HPI:  72 y.o. female, with recent diagnosis of rectal cancer with  rectal bleeding under the care of Dr. Truett Perna and Dr. Doylene Canard,  who completed chemoradiation 4 days ago, CVA in the past  affecting left  sided extremities for which she had completely  recovered, type 2 diabetes mellitus, dyslipidemia, hypertension  who was in her usual state of health till about 4 hours ago she  experienced sudden onset of left-sided weakness, facial droop and  got limp, this was noticed by family members who were around her,  she was brought to the ER, code stroke was called she was seen by  neurology. Head CT did not show any acute change, due to her  rectal cancer and history of rectal bleeding she was not a TPA  candidate, after neurology saw the patient they recommended  hospitalist admission.  CXR  Mild enlargement of cardiac  silhouette. No acute abnormalities.  Failed RN swallow screen due  to decreased level of alertness.  Daughter reports pt. recently  "choked on a pill and has had difficutly swallowing ever since."       Assessment / Plan / Recommendation  Clinical Impression  Dysphagia Diagnosis: Severe oral phase dysphagia;Mild pharyngeal  phase dysphagia Clinical impression: Ms Shipman presents with a severe oral based  dysphagia with left sided weakness, combined with briefly  sustained attention and decreased initiation, which all impair  her ability to accept and transit POs.   With max cues including verbal tactile, dry spoons, mixed  textures, positioning etc, the pt orally held boluses with  moderate to severe anterior spillage. Though pt was visibly  awake, she was intermittently aware of PO presentations. When a  swallow was elicited however the swallow response was delayed but  otherwise strong with no penetration or aspiration observed.   Given the effort required for minimal intake, the pt will not  nutritionally support herself orally. She is recommended to  initiate a diet with staff and family allowed to provide POs  given adequate pharyngeal function, in combination with non oral  nutrition. Discussed with dtr who is agreeable to long term  alternate nutrition, with awareness of some of the risks   associated with that method of feeding, but still with hopes that  pt will slowly regain ability to eat and drink with therapy and  time.     Treatment Recommendation  Therapy as outlined in treatment plan below    Diet Recommendation Dysphagia 1 (Puree);Nectar-thick liquid   Liquid Administration via: Spoon;Cup;Straw Medication Administration: Via alternative means Supervision: Staff feed patient;Trained caregiver to feed patient Compensations: Slow rate;Small sips/bites;Follow solids with  liquid;Check for pocketing;Check for anterior loss Postural Changes and/or Swallow Maneuvers: Seated upright 90  degrees;Upright 30-60 min after meal    Other  Recommendations Oral Care Recommendations: Oral care  before and after PO Other Recommendations: Order thickener from pharmacy   Follow Up Recommendations  Skilled Nursing facility    Frequency and Duration min 2x/week  2 weeks   Pertinent Vitals/Pain NA    SLP Swallow Goals Patient will utilize recommended strategies during swallow to  increase swallowing safety with: Maximal cueing;Total assistance   General HPI: 72 y.o. female, with recent diagnosis of rectal  cancer with rectal bleeding under the care of Dr. Truett Perna and  Dr. Doylene Canard, who completed chemoradiation 4 days ago, CVA in the  past affecting left sided extremities for which she had  completely recovered, type 2 diabetes mellitus, dyslipidemia,  hypertension who was in her usual state of health till about 4  hours ago she experienced sudden onset of left-sided weakness,  facial droop and got limp, this was noticed by family members who  were around her, she was brought to the ER, code stroke was  called she was seen by neurology. Head CT did not show any acute  change, due to her rectal cancer and history of rectal bleeding  she was not a TPA candidate, after neurology saw the patient they  recommended hospitalist admission.  CXR  Mild enlargement of  cardiac silhouette. No acute abnormalities.  Failed RN  swallow  screen due to decreased level of alertness.  Daughter reports pt.  recently "choked on a pill and has had difficutly swallowing ever  since."  Type of Study: Modified Barium Swallowing Study Reason for Referral: Objectively evaluate swallowing function Diet Prior to this Study: NPO Temperature Spikes Noted: No Respiratory Status: Room air History of Recent Intubation: No Behavior/Cognition: Alert;Requires cueing;Decreased sustained  attention;Cooperative Oral Cavity - Dentition: Edentulous Oral Motor / Sensory Function: Impaired - see Bedside swallow  eval Self-Feeding Abilities: Needs assist Patient Positioning: Upright in chair Baseline Vocal Quality:  Clear Volitional Cough: Weak Volitional Swallow: Able to elicit Anatomy: Within functional limits Pharyngeal Secretions: Not observed secondary MBS    Reason for Referral Objectively evaluate swallowing function   Oral Phase     Pharyngeal Phase    Cervical Esophageal Phase    GO             Harlon Ditty, MA CCC-SLP [phone]  Claudine Mouton 09/29/2012, 1:46 PM     Assessment/Plan Right MCA infarct. Dysphagia with NG tube in place.\ Rectal cancer with bleeding, receiving radiation. H/H stable (hgb 9.1, HCT 28.9) Request for percutaneous gastric tube placement prior to SNF. Patient is on Zosyn. Check am labs and abdominal xray. Plan for g-tube 8/13 Risks and Benefits discussed with the patient and the patient's family. All of the patient's questions were answered, patient is agreeable to proceed. Consent signed and in chart.    Pattricia Boss D PA-C 09/29/2012, 4:28 PM

## 2012-09-29 NOTE — Progress Notes (Signed)
[facility] PROGRESS NOTE  Rachel Benton GNF:[id number] DOB: [birth year] DOA: 09/21/2012 PCP: Elby Showers, MD  Brief narrative:  72 y.o. female, with recent diagnosis of rectal cancer with rectal bleeding under the care of Dr. Truett Perna and Dr. 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 She was brought to the ER, code stroke was called she was seen by Neurology. Head CT did not show any acute change. Due to her rectal cancer and history of rectal bleeding she was not a TPA candidate. Due to the patient's significant somnolence and dysphagia, the patient was initially admitted to step down unit.  She was transferred to Neuroscience floor on 09/25/12.   Assessment/Plan: tachypnea  -likely due to Cheyne-Stokes respirations from the patient's acute stroke  -Check ABG 7.52/31/67/26  -chest x-ray--increased LLL atelectasis, ?infiltrate  -Check EKG--sinus rhythm without ST to T wave change  -check TSH-2.447  -lactate 2.2  LLL infiltrate/atelectasis  -in light of pt hypoxemia, tachypnea and dysphagia, start empiric zosyn (8/10)--today is D#3/7  Right MCA infarct with cytotoxic cerebral edema and acute to subacute lacunar infarct in the posterior left MCA territory -patient's mentation waxes and wanes throughout the day with periods of alertness and somnolence  -Patient arouses answers questions appropriately  felt to be thromboembolic secondary to occluded right M1 noted on MRA - no signif stenosis on carotids - no further w/u indicated per Stroke Team  -continue ASA for CVA prophy  Dysphagia  -discussed with speech  therapy-->repeat MBS 09/29/12  -discussed with speech therapy-->although pt "passed" swallow eval, pt currently unable to take enough po nutrition due to deconditioned state -earlier (8/5) failed her bedside swallow screen - Dobhoff feeding tube placed 8/6 for short term   -discussed PEG vs/ comfort care by early next week with family  -daughter, husband, and patient agreeable for gastrostomy tube placement until which time patient continues to improve clinically and able to take enough oral intake to sustain herself -requested IR for gastrostomy placement DIABETES MELLITUS, TYPE II  -hyperglycemia has been exacerbated by the patient's tube feedings  -Jevity has been changed to Glucerna (09/28/2012)  -Start NovoLog sliding scale and 4 units NovoLog every 4 hours -CBGs gradually improving with changed to Glucerna and NovoLog insulin  Hypokalemia  -replete  -check mag--2.1 HYPERLIPIDEMIA  On zocor  ANXIETY, MILD  -currently, patient denies any feelings of anxiety or jitteriness  On chronic benzo as outpt - clarified with family today that patient only uses "every now and then" - stopped scheduled dosing and follow clinically with prn only  HYPERTENSION  BP currently reasonably controlled  -continue metoprolol tartrate 25 mg twice a day  Tachycardia/ocasional bursts of ST w/ frequent PAC's vs PAF  Volume depletion v/s anxiety - also likely BB withdrawal - follow clinically  -appears to be improving since restarting metoprolol tartrate -previous EKG showed sinus with PACs -place back on telemetry-->if Afib, may need to altered antiplatelet tx BACK PAIN, CHRONIC  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 Truett Perna  Protein-calorie malnutrition, severe  -Tube feeding ongoing via PANDA tube  -tolerating TF  -repeat  abdominal x-ray reveals that tube remains in the stomach antrum  Code Status: FULL  Disposition  Plan: SNF after PEG and toleration of enteral feeds Consultants:  Neurology  Procedures:  8/5 - Cartotid dopplers - Bilateral: 1-39% ICA stenosis  8/5 - TTE - EF 55-60% - grade I DD - no obvious source of emboli  Antibiotics:  Rocephin 8/5 >> 09/27/2012  Zosyn 09/27/12>>> Family Communication: daughter and husband at beside              Procedures/Studies: Ct Head Wo Contrast  09/21/2012   *RADIOLOGY REPORT*  Clinical Data: 72 year old female - code stroke - altered mental status.  CT HEAD WITHOUT CONTRAST  Technique:  Contiguous axial images were obtained from the base of the skull through the vertex without contrast.  Comparison: 06/15/2008 CT and MRI  Findings: Remote right cerebellar and right thalamic infarcts noted as well as mild chronic small vessel white matter ischemic changes.  No acute intracranial abnormalities are identified, including mass lesion or mass effect, hydrocephalus, extra-axial fluid collection, midline shift, hemorrhage, or acute infarction.  The visualized bony calvarium is unremarkable.  IMPRESSION: No evidence of acute intracranial abnormality.  Remote right cerebellar and right thalamic infarcts and mild chronic small vessel white matter ischemic changes.   Original Report Authenticated By: Harmon Pier, M.D.   Mr Brain Wo Contrast  09/22/2012   *RADIOLOGY REPORT*  Clinical Data:  72 year old female with sudden onset left side weakness and facial droop.  Not at tPA candidate due to history of rectal cancer/rectal bleeding.  Recent diagnosis of rectal cancer. History of prior stroke affecting the left side.  Comparison: Head CT without contrast 09/21/2012.  Brain MRI/MRA 06/16/2008.  MRI HEAD WITHOUT CONTRAST  Technique: Multiplanar, multiecho pulse sequences of the brain and surrounding structures were obtained according to standard protocol without intravenous contrast.  Findings: Confluent restricted diffusion throughout much of the right MCA territory,  affecting cortex and white matter.  Basal ganglia partially affected.  Overall moderate stroke size. Associated cytotoxic edema.  No midline shift or significant mass effect at this time.  No associated acute intracranial hemorrhage. Occasional small foci of restricted diffusion also in the right occipital lobe.  No posterior fossa involvement.  There could be a punctate area of restricted diffusion also in the left posterior MCA territory, left parietal lobe on series 4 image 18.  Major intracranial vascular flow voids are stable at the skull base, but the right MCA M1 segment may now be abnormal.  See MRA findings below.  Interval expected evolution of the right thalamic lacunar infarct which occurred in 2010.  Chronic right cerebellar infarct is stable.  No ventriculomegaly.  Negative pituitary, cervicomedullary junction and visualized cervical spine.  No extra-axial collection. No vasogenic cerebral edema is identified.  Bone marrow signal now is mostly abnormal in the skull and visible cervical spine.  Marrow signal is normal in 2010.  Visualized orbit soft tissues are within normal limits.  Mild mastoid effusions greater on the right.  Stable paranasal sinuses. Negative scalp soft tissues.  IMPRESSION: 1.  Moderate sized confluent right MCA infarct with cytotoxic edema. See MRA findings below.  No associated hemorrhage or mass effect at this time. 2.  Possible superimposed acute to subacute lacunar infarct in the posterior left MCA territory. 3.  Otherwise, stable brain with expected evolution of the 2010 right thalamus lacunar infarct. 4.  Bone marrow signal has become abnormal since 2010.  This could be related to anemia due to GI  bleeding or cancer therapy. Metastatic disease to bone is less likely. 5.  Mild mastoid effusions.  MRA HEAD WITHOUT CONTRAST  Technique: Angiographic images of the Circle of Willis were obtained using MRA technique without  intravenous contrast.  Findings: Artifact at the  cervicomedullary junction.  Stable diminutive posterior circulation with antegrade flow in the distal vertebral arteries.  Patent PICA origins.  Patent basilar artery without stenosis.  Fetal type bilateral PCA origins re- identified.  Chronic left PCA superior division origin stenosis. Chronic tandem stenoses in the right PCA distal P1 and P2 segment, not significantly changed.  Stable antegrade flow in both ICA siphons.  No ICA stenosis. Patent carotid termini.  Bilateral ACA and left MCA origins are patent.  Diminutive or absent anterior communicating artery.  Visualized bilateral ACA branches are within normal limits.  Stable visualized left MCA branches.  Right MCA occlusion just beyond the M1 segment origin.  No distal right MCA flow.  IMPRESSION: 1.  New right MCA occlusion just beyond its origin. 2.  Otherwise stable intracranial MRA including proximal right PCA tandem stenoses and distal left PCA stenosis.  Salient findings discussed with Dr. Leroy Kennedy by telephone at 2020 hours on 09/22/2012.   Original Report Authenticated By: Erskine Speed, M.D.   Dg Chest Port 1 View  09/27/2012   *RADIOLOGY REPORT*  Clinical Data: Tachypnea  PORTABLE CHEST - 1 VIEW  Comparison: 09/21/2012  Findings: The lower lobe airspace disease has progressed.  This may be atelectasis or pneumonia.  No heart failure.  Right lung is clear.  The feeding tube extends into the stomach with the tip of the tube not visualized.  IMPRESSION: Progression of left lower lobe atelectasis/infiltrate.   Original Report Authenticated By: Janeece Riggers, M.D.   Dg Chest Port 1 View  09/21/2012   *RADIOLOGY REPORT*  Clinical Data: Cough, weakness, question stroke  PORTABLE CHEST - 1 VIEW  Comparison: Portable exam 1627 hours compared to 06/15/2008  Findings: Enlargement of cardiac silhouette. Tortuous aorta with atherosclerotic calcification at arch. Mediastinal contours and pulmonary vascularity normal. Lungs clear. No pleural effusion or  pneumothorax. Bones demineralized.  IMPRESSION: Mild enlargement of cardiac silhouette. No acute abnormalities.   Original Report Authenticated By: Ulyses Southward, M.D.   Dg Abd Portable 1v  09/26/2012   *RADIOLOGY REPORT*  Clinical Data: Feeding tube placement  PORTABLE ABDOMEN - 1 VIEW  Comparison: September 23, 2012  Findings: Tip of the feeding tube is in the antrum of the stomach. Moderate stool in the ascending colon.  Mild distention of the colon.  IMPRESSION: Feeding tube tip is in the antrum of the stomach.   Original Report Authenticated By: Jolaine Click, M.D.   Dg Abd Portable 1v  09/23/2012   *RADIOLOGY REPORT*  Clinical Data: Feeding tube placement.  PORTABLE ABDOMEN - 1 VIEW  Comparison: None.  Findings: Single portable view of the abdomen submitted.  There is nonspecific nonobstructive bowel gas pattern.  There is a NG feeding tube with tip in distal gastric region.  IMPRESSION: NG feeding tube with tip in distal gastric region.   Original Report Authenticated By: Natasha Mead, M.D.   Mr Mra Head/brain Wo Cm  09/22/2012   *RADIOLOGY REPORT*  Clinical Data:  73 year old female with sudden onset left side weakness and facial droop.  Not at tPA candidate due to history of rectal cancer/rectal bleeding.  Recent diagnosis of rectal cancer. History of prior stroke affecting the left side.  Comparison: Head CT without contrast 09/21/2012.  Brain MRI/MRA  06/16/2008.  MRI HEAD WITHOUT CONTRAST  Technique: Multiplanar, multiecho pulse sequences of the brain and surrounding structures were obtained according to standard protocol without intravenous contrast.  Findings: Confluent restricted diffusion throughout much of the right MCA territory, affecting cortex and white matter.  Basal ganglia partially affected.  Overall moderate stroke size. Associated cytotoxic edema.  No midline shift or significant mass effect at this time.  No associated acute intracranial hemorrhage. Occasional small foci of restricted diffusion  also in the right occipital lobe.  No posterior fossa involvement.  There could be a punctate area of restricted diffusion also in the left posterior MCA territory, left parietal lobe on series 4 image 18.  Major intracranial vascular flow voids are stable at the skull base, but the right MCA M1 segment may now be abnormal.  See MRA findings below.  Interval expected evolution of the right thalamic lacunar infarct which occurred in 2010.  Chronic right cerebellar infarct is stable.  No ventriculomegaly.  Negative pituitary, cervicomedullary junction and visualized cervical spine.  No extra-axial collection. No vasogenic cerebral edema is identified.  Bone marrow signal now is mostly abnormal in the skull and visible cervical spine.  Marrow signal is normal in 2010.  Visualized orbit soft tissues are within normal limits.  Mild mastoid effusions greater on the right.  Stable paranasal sinuses. Negative scalp soft tissues.  IMPRESSION: 1.  Moderate sized confluent right MCA infarct with cytotoxic edema. See MRA findings below.  No associated hemorrhage or mass effect at this time. 2.  Possible superimposed acute to subacute lacunar infarct in the posterior left MCA territory. 3.  Otherwise, stable brain with expected evolution of the 2010 right thalamus lacunar infarct. 4.  Bone marrow signal has become abnormal since 2010.  This could be related to anemia due to GI bleeding or cancer therapy. Metastatic disease to bone is less likely. 5.  Mild mastoid effusions.  MRA HEAD WITHOUT CONTRAST  Technique: Angiographic images of the Circle of Willis were obtained using MRA technique without  intravenous contrast.  Findings: Artifact at the cervicomedullary junction.  Stable diminutive posterior circulation with antegrade flow in the distal vertebral arteries.  Patent PICA origins.  Patent basilar artery without stenosis.  Fetal type bilateral PCA origins re- identified.  Chronic left PCA superior division origin stenosis.  Chronic tandem stenoses in the right PCA distal P1 and P2 segment, not significantly changed.  Stable antegrade flow in both ICA siphons.  No ICA stenosis. Patent carotid termini.  Bilateral ACA and left MCA origins are patent.  Diminutive or absent anterior communicating artery.  Visualized bilateral ACA branches are within normal limits.  Stable visualized left MCA branches.  Right MCA occlusion just beyond the M1 segment origin.  No distal right MCA flow.  IMPRESSION: 1.  New right MCA occlusion just beyond its origin. 2.  Otherwise stable intracranial MRA including proximal right PCA tandem stenoses and distal left PCA stenosis.  Salient findings discussed with Dr. Leroy Kennedy by telephone at 2020 hours on 09/22/2012.   Original Report Authenticated By: Erskine Speed, M.D.         Subjective: Patient is awake and alert. Denies any headache, visual disturbance, chest pain, shortness breath, vomiting, diarrhea, Donna pain. No fevers or chills.  Objective: Filed Vitals:   09/28/12 2200 09/29/12 0200 09/29/12 0600 09/29/12 1015  BP: 128/63 137/67 137/68 125/81  Pulse: 84 94 101 88  Temp: 98 F (36.7 C) 97.3 F (36.3 C) 97.2 F (36.2 C) 98.7 F (  37.1 C)  TempSrc:    Axillary  Resp: 22 20 20 22   Height:      Weight:   67.5 kg (148 lb 13 oz)   SpO2: 96% 96% 99% 94%   No intake or output data in the 24 hours ending 09/29/12 1248 Weight change: 2.1 kg (4 lb 10.1 oz) Exam:   General:  Pt is alert, follows commands appropriately, not in acute distress  HEENT: No icterus, No thrush,[place]/AT  Cardiovascular: RRR, S1/S2, no rubs, no gallops  Respiratory: scattered basilar rales. No wheezes. Good air movement.  Abdomen: Soft/+BS, non tender, non distended, no guarding  Extremities: No edema, No lymphangitis, No petechiae, No rashes, no synovitis  Data Reviewed: Basic Metabolic Panel:  Recent Labs Lab 09/26/12 0500 09/27/12 0600 09/27/12 1213 09/28/12 0505 09/29/12 0535  NA 133* 135  135 136 137  K 3.1* 3.6 3.5 3.4* 3.7  CL 96 99 99 99 101  CO2 26 26 27 26 24   GLUCOSE 256* 292* 295* 290* 168*  BUN 13 10 11 13 17   CREATININE 0.25* 0.24* 0.27* 0.24* 0.26*  CALCIUM 8.1* 8.2* 8.2* 8.1* 8.3*  MG  --  1.9  --   --  2.1   Liver Function Tests: No results found for this basename: AST, ALT, ALKPHOS, BILITOT, PROT, ALBUMIN,  in the last 168 hours No results found for this basename: LIPASE, AMYLASE,  in the last 168 hours No results found for this basename: AMMONIA,  in the last 168 hours CBC:  Recent Labs Lab 09/23/12 0534 09/24/12 0500 09/26/12 0500 09/27/12 0600 09/28/12 0505  WBC 3.9* 4.1 3.4* 3.1* 3.9*  HGB 9.2* 10.6* 9.4* 10.3* 9.1*  HCT 28.6* 32.8* 29.8* 33.5* 28.9*  MCV 81.3 81.4 83.0 82.7 82.6  PLT 85* 87* 135* PLATELET CLUMPS NOTED ON SMEAR, COUNT APPEARS DECREASED 160   Cardiac Enzymes:  Recent Labs Lab 09/27/12 1213 09/27/12 1815 09/27/12 2223  TROPONINI <0.30 <0.30 <0.30   BNP: No components found with this basename: POCBNP,  CBG:  Recent Labs Lab 09/28/12 1948 09/28/12 2357 09/29/12 0352 09/29/12 0850 09/29/12 1212  GLUCAP 267* 201* 155* 132* 157*    Recent Results (from the past 240 hour(s))  MRSA PCR SCREENING     Status: None   Collection Time    09/22/12  3:33 AM      Result Value Range Status   MRSA by PCR NEGATIVE  NEGATIVE Final   Comment:            The GeneXpert MRSA Assay (FDA     approved for NASAL specimens     only), is one component of a     comprehensive MRSA colonization     surveillance program. It is not     intended to diagnose MRSA     infection nor to guide or     monitor treatment for     MRSA infections.     Scheduled Meds: . antiseptic oral rinse  15 mL Mouth Rinse q12n4p  . aspirin  325 mg Per Tube Daily  . chlorhexidine  15 mL Mouth Rinse BID  . free water  100 mL Per Tube Q6H  . insulin aspart  0-15 Units Subcutaneous Q4H  . insulin aspart  4 Units Subcutaneous Q4H  . metoprolol tartrate   25 mg Per Tube BID  . piperacillin-tazobactam  3.375 g Intravenous Q8H  . simvastatin  40 mg Per Tube q1800   Continuous Infusions: . sodium chloride 250 mL (09/29/12  7829)  . feeding supplement (GLUCERNA 1.2 CAL) 1,000 mL (09/28/12 1444)     Kabao Leite, DO  [facility] Pager [phone]  If 7PM-7AM, please contact night-coverage [email or web address] Password [facility] 09/29/2012, 12:48 PM   LOS: 8 days

## 2012-09-29 NOTE — Procedures (Signed)
Objective Swallowing Evaluation: Modified Barium Swallowing Study  Patient Details  Name: Rachel Benton MRN: [id number] Date of Birth: 1940-08-25  Today's Date: 09/29/2012 Time: 1130-1200 SLP Time Calculation (min): 30 min  Past Medical History:  Past Medical History  Diagnosis Date  . Hypertension   . Diabetes mellitus without complication   . Hyperlipidemia   . Back pain   . Insomnia   . Hyperlipidemia   . Stroke 2009  . Hemorrhoids   . History of recent fall 07/05/12  . Cancer 07/21/12    rectal, lymphadenopathy   Past Surgical History:  Past Surgical History  Procedure Laterality Date  . Hip arthroplasty Left 2009    fall injury  . Abdominal hysterectomy      TAH, > 37 yrs ago  . Bladder suspension      many yrs ago   HPI:  72 y.o. female, with recent diagnosis of rectal cancer with rectal bleeding under the care of Dr. Truett Perna and Dr. 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 Head CT did not show any acute change, due to her rectal cancer and history of rectal bleeding she was not a TPA candidate, after neurology saw the patient they recommended hospitalist admission.  CXR  Mild enlargement of cardiac silhouette. No acute abnormalities.  Failed RN swallow screen due to decreased level of alertness.  Daughter reports pt. recently "choked on a pill and has had difficutly swallowing ever since."      Assessment / Plan / Recommendation Clinical Impression  Dysphagia Diagnosis: Severe oral phase dysphagia;Mild pharyngeal phase dysphagia Clinical impression: Ms Watterson  presents with a severe oral based dysphagia with left sided weakness, combined with briefly sustained attention and decreased initiation, which all impair her ability to accept and transit POs.   With max cues including verbal tactile, dry spoons, mixed textures, positioning etc, the pt orally held boluses with moderate to severe anterior spillage. Though pt was visibly awake, she was intermittently aware of PO presentations. When a swallow was elicited however the swallow response was delayed but otherwise strong with no penetration or aspiration observed.   Given the effort required for minimal intake, the pt will not nutritionally support herself orally. She is recommended to initiate a diet with staff and family allowed to provide POs given adequate pharyngeal function, in combination with non oral nutrition. Discussed with dtr who is agreeable to long term alternate nutrition, with awareness of some of the risks associated with that method of feeding, but still with hopes that pt will slowly regain ability to eat and drink with therapy and time.     Treatment Recommendation  Therapy as outlined in treatment plan below    Diet Recommendation Dysphagia 1 (Puree);Nectar-thick liquid   Liquid Administration via: Spoon;Cup;Straw Medication Administration: Via alternative means Supervision: Staff feed patient;Trained caregiver to feed patient Compensations: Slow rate;Small sips/bites;Follow solids with liquid;Check for pocketing;Check for anterior loss Postural Changes and/or Swallow Maneuvers: Seated upright 90 degrees;Upright 30-60 min after meal    Other  Recommendations Oral Care Recommendations: Oral care before and after PO Other Recommendations: Order  thickener from pharmacy   Follow Up Recommendations  Skilled Nursing facility    Frequency and Duration min 2x/week  2 weeks   Pertinent Vitals/Pain NA    SLP Swallow Goals Patient will utilize recommended strategies during swallow to  increase swallowing safety with: Maximal cueing;Total assistance   General HPI: 72 y.o. female, with recent diagnosis of rectal cancer with rectal bleeding under the care of Dr. Truett Perna and Dr. 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 Head CT did not show any acute change, due to her rectal cancer and history of rectal bleeding she was not a TPA candidate, after neurology saw the patient they recommended hospitalist admission.  CXR  Mild enlargement of cardiac silhouette. No acute abnormalities.  Failed RN swallow screen due to decreased level of alertness.  Daughter reports pt. recently "choked on a pill and has had difficutly swallowing ever since."  Type of Study: Modified Barium Swallowing Study Reason for Referral: Objectively evaluate swallowing function Diet Prior to this Study: NPO Temperature Spikes Noted: No Respiratory Status: Room air History of Recent Intubation: No Behavior/Cognition: Alert;Requires cueing;Decreased sustained attention;Cooperative Oral Cavity - Dentition: Edentulous Oral Motor / Sensory Function: Impaired - see Bedside swallow eval Self-Feeding Abilities: Needs assist Patient Positioning: Upright in chair Baseline Vocal Quality: Clear Volitional Cough: Weak Volitional Swallow: Able to elicit Anatomy: Within functional limits Pharyngeal Secretions: Not observed secondary MBS    Reason for Referral Objectively evaluate swallowing function   Oral Phase     Pharyngeal Phase    Cervical Esophageal Phase    GO             Harlon Ditty,  MA CCC-SLP [phone]  Claudine Mouton 09/29/2012, 1:46 PM

## 2012-09-29 NOTE — Progress Notes (Signed)
OT Cancellation Note  Patient Details Name: Rachel Benton MRN: [id number] DOB: March 21, 1940   Cancelled Treatment:    Reason Eval/Treat Not Completed: Patient at procedure or test/ unavailable. Pt currently off the floor and OT to re attempt when appropriate.   Harrel Carina [place] Pager: [phone]  09/29/2012, 11:34 AM

## 2012-09-30 ENCOUNTER — Inpatient Hospital Stay (HOSPITAL_COMMUNITY): Payer: Medicare Other

## 2012-09-30 DIAGNOSIS — E43 Unspecified severe protein-calorie malnutrition: Secondary | ICD-10-CM

## 2012-09-30 LAB — GLUCOSE, CAPILLARY
Glucose-Capillary: 164 mg/dL — ABNORMAL HIGH (ref 70–99)
Glucose-Capillary: 97 mg/dL (ref 70–99)

## 2012-09-30 LAB — CBC
HCT: 28 % — ABNORMAL LOW (ref 36.0–46.0)
MCH: 25.8 pg — ABNORMAL LOW (ref 26.0–34.0)
MCHC: 31.1 g/dL (ref 30.0–36.0)
MCV: 83.1 fL (ref 78.0–100.0)
Platelets: 194 10*3/uL (ref 150–400)
RDW: 29 % — ABNORMAL HIGH (ref 11.5–15.5)

## 2012-09-30 LAB — BASIC METABOLIC PANEL
BUN: 18 mg/dL (ref 6–23)
CO2: 27 mEq/L (ref 19–32)
Calcium: 8.2 mg/dL — ABNORMAL LOW (ref 8.4–10.5)
Chloride: 102 mEq/L (ref 96–112)
Creatinine, Ser: 0.3 mg/dL — ABNORMAL LOW (ref 0.50–1.10)

## 2012-09-30 MED ORDER — KETOROLAC TROMETHAMINE 15 MG/ML IJ SOLN
15.0000 mg | Freq: Once | INTRAMUSCULAR | Status: AC
  Administered 2012-09-30: 15 mg via INTRAVENOUS
  Filled 2012-09-30: qty 1

## 2012-09-30 MED ORDER — FENTANYL CITRATE 0.05 MG/ML IJ SOLN
INTRAMUSCULAR | Status: AC | PRN
  Administered 2012-09-30 (×2): 25 ug via INTRAVENOUS

## 2012-09-30 MED ORDER — MIDAZOLAM HCL 2 MG/2ML IJ SOLN
INTRAMUSCULAR | Status: AC | PRN
  Administered 2012-09-30: 1 mg via INTRAVENOUS

## 2012-09-30 MED ORDER — SODIUM CHLORIDE 0.9 % IV SOLN
INTRAVENOUS | Status: DC
  Administered 2012-09-30: 15:00:00 via INTRAVENOUS
  Filled 2012-09-30 (×4): qty 1000

## 2012-09-30 MED ORDER — GLUCERNA 1.2 CAL PO LIQD
1000.0000 mL | ORAL | Status: DC
  Administered 2012-10-01: 1000 mL
  Filled 2012-09-30 (×2): qty 1000

## 2012-09-30 MED ORDER — PIPERACILLIN-TAZOBACTAM 3.375 G IVPB
3.3750 g | Freq: Three times a day (TID) | INTRAVENOUS | Status: DC
  Administered 2012-09-30 – 2012-10-01 (×4): 3.375 g via INTRAVENOUS
  Filled 2012-09-30 (×6): qty 50

## 2012-09-30 MED ORDER — POTASSIUM CHLORIDE 10 MEQ/100ML IV SOLN: 10.0000 meq | INTRAVENOUS | Status: DC

## 2012-09-30 MED ORDER — IOHEXOL 300 MG/ML  SOLN
50.0000 mL | Freq: Once | INTRAMUSCULAR | Status: AC | PRN
  Administered 2012-09-30: 25 mL via INTRAVENOUS

## 2012-09-30 NOTE — Progress Notes (Signed)
NUTRITION FOLLOW UP  Intervention:   1. D/c current TF orders. Once ok to use PEG ( 8/14 @ noon) initiate Glucerna 1.2 @ 20 ml/hr and advance by 10 ml q 4 hr to a goal rate of 55 ml/hr. This ill provide 1584 kcal, 79 gm protein, and 1063 ml free water daily.   2. Continue free water flushes, 100 ml q 6 hr as previously ordered via PEG.   Nutrition Dx:   Malnutrition related to cancer and cancer related treatments as evidenced by 7% weight loss x 1 month and estimated intake < 75% of her needs for > 1 month, ongoing  Goal:   Meet >/=90% estimated nutrition needs. Met  Monitor:   Tolerance of EN, diet advance, weight trends, labs   Assessment:   Pt s/p MBS was made NPO and IR placed PEG this morning. PEG to LIWS at this time and ok'd to use starting tomorrow at noon.  NG tube has been removed.   Height: Ht Readings from Last 1 Encounters:  09/22/12 4\' 9"  (1.448 m)    Weight Status:   Wt Readings from Last 1 Encounters:  09/30/12 152 lb 1.9 oz (69 kg)    Re-estimated needs:  Kcal: 1400-1600  Protein: 65-75 gm  Fluid: >/= 1.5 L  Skin: reddened area on sacrum   Diet Order: NPO  No intake or output data in the 24 hours ending 09/30/12 1124  Last BM: 8/4   Labs:   Recent Labs Lab 09/26/12 0500 09/27/12 0600  09/28/12 0505 09/29/12 0535 09/30/12 0725  NA 133* 135  < > 136 137 138  K 3.1* 3.6  < > 3.4* 3.7 3.2*  CL 96 99  < > 99 101 102  CO2 26 26  < > 26 24 27   BUN 13 10  < > 13 17 18   CREATININE 0.25* 0.24*  < > 0.24* 0.26* 0.30*  CALCIUM 8.1* 8.2*  < > 8.1* 8.3* 8.2*  MG  --  1.9  --   --  2.1  --   GLUCOSE 256* 292*  < > 290* 168* 95  < > = values in this interval not displayed.  CBG (last 3)   Recent Labs  09/30/12 0011 09/30/12 0438 09/30/12 0755  GLUCAP 164* 115* 97    Scheduled Meds: . antiseptic oral rinse  15 mL Mouth Rinse q12n4p  . aspirin  325 mg Per Tube Daily  . chlorhexidine  15 mL Mouth Rinse BID  . free water  100 mL Per Tube  Q6H  . insulin aspart  0-15 Units Subcutaneous Q4H  . insulin aspart  4 Units Subcutaneous Q4H  . metoprolol tartrate  25 mg Per Tube BID  . piperacillin-tazobactam (ZOSYN)  IV  3.375 g Intravenous Q8H  . simvastatin  40 mg Per Tube q1800    Continuous Infusions: . sodium chloride 250 mL (09/29/12 0520)  . feeding supplement (GLUCERNA 1.2 CAL) 1,000 mL (09/28/12 1444)    Clarene Duke RD, LDN Pager [phone] After Hours pager [phone]

## 2012-09-30 NOTE — ED Notes (Signed)
GTUBE in place- no complications.

## 2012-09-30 NOTE — Procedures (Signed)
Interventional Radiology Procedure Note  Procedure: Placement of percutaneous 20F pull-through gastrostomy tube. Complications: None Recommendations: - NPO except for sips and chips remainder of today and overnight - Maintain G-tube to LWS until tomorrow morning  - May advance diet as tolerated and begin using tube tomorrow at noon.  Signed,  Orest Dygert K. Camri Molloy, MD Vascular & Interventional Radiologist [facility]  

## 2012-09-30 NOTE — Progress Notes (Addendum)
[facility] PROGRESS NOTE  JOSEFITA WEISSMANN Benton:[id number] DOB: 08-Jun-1940 DOA: 09/21/2012 PCP: Elby Showers, MD Brief narrative:  72 y.o. female, with recently diagnosed rectal cancer with rectal bleeding followed by  Dr. Truett Perna and Dr. 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 This was noticed by family members who were around her. She was brought to the ER, code stroke was called and she was seen by Neurology. Head CT did not show any acute change. Due to her rectal cancer and history of rectal bleeding she was not a TPA candidate. Due to the patient's significant somnolence and dysphagia, the patient was initially admitted to step down unit. She was transferred to Neuro  floor on 09/25/12.   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  Event felt to be thromboembolic secondary to occluded right M1 noted on MRA - no significant  carotid stenosis - no further w/u indicated per Stroke Team  -continue ASA   Dysphagia  -MBS repeated on 8/12 -discussed with speech therapy-->although patient  cleared swallow eval, pt currently unable to take enough po nutrition due to deconditioning  -patient on admission failed swallow function and an NG feeding tube placed.  -after discussion family agreed upon a gastrostomy feeding tube which was placed by IR today. Start  using the tube after 24 hrs. On Glucerna.  tachypnea  -likely Cheyne-Stokes respirations from the patient's acute stroke vs LLL infiltrate/  atalectasis -currently improving. TSH wnl.  LLL infiltrate/atelectasis  Empirically started on zosyn given hypoxemia, tachypnea and dysphagia ( day4/7) transition to oral Levaquin on discharge.  DIABETES MELLITUS, TYPE II  -hyperglycemia due to Jevity feeds and thus switched to glucerna. Added novolog and her fsg has been stable.  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 Truett Perna   Protein-calorie malnutrition, severe  -tube feeding with PANDA tube.G tube placed today. Will start feeds tomorrow.  Anemia  possibly related to rectal ca. Monitor   Code Status: FULL   Disposition Plan: SNF after PEG and toleration of enteral feeds   Consultants:  Neurology   Procedures:  8/5 - Cartotid dopplers - Bilateral: 1-39% ICA stenosis  8/5 - TTE - EF 55-60% - grade I DD - no obvious source of emboli   Antibiotics:  Rocephin 8/5 >> 09/27/2012  Zosyn 09/27/12>>>   Family Communication: daughter at beside      Objective: Filed Vitals:   09/30/12 1110  BP: 131/69  Pulse: 101  Temp:   Resp: 27   No intake or output data in the 24 hours ending 09/30/12 1350 Filed Weights   09/28/12 0600 09/29/12 0600 09/30/12 0500  Weight: 67.5 kg (148 lb 13 oz) 67.5 kg (148 lb 13 oz)  69 kg (152 lb 1.9 oz)    Exam:   General:  Elderly thin built female in NAD, sleepy  HEENT: no pallor, dry oral mucosa, PANDA tube  Chest: Clear b/l, no added sounds  CVS: NS1&S2, no murmurs  Abd: soft, NT, ND, BS+  Ex: warm, no edema   CNS: AAOX3, but sleepy at times, left hemiplegia, jerky movements of rt foot  Data Reviewed: Basic Metabolic Panel:  Recent  Labs Lab 09/26/12 0500 09/27/12 0600 09/27/12 1213 09/28/12 0505 09/29/12 0535 09/30/12 0725  NA 133* 135 135 136 137 138  K 3.1* 3.6 3.5 3.4* 3.7 3.2*  CL 96 99 99 99 101 102  CO2 26 26 27 26 24 27   GLUCOSE 256* 292* 295* 290* 168* 95  BUN 13 10 11 13 17 18   CREATININE 0.25* 0.24* 0.27* 0.24* 0.26* 0.30*  CALCIUM 8.1* 8.2* 8.2* 8.1* 8.3* 8.2*  MG  --  1.9  --   --  2.1  --    Liver Function Tests: No results found for this basename: AST, ALT, ALKPHOS, BILITOT, PROT, ALBUMIN,  in the last 168 hours No results found for this basename: LIPASE, AMYLASE,  in the last 168 hours No results found for this basename: AMMONIA,  in the last 168 hours CBC:  Recent Labs Lab 09/24/12 0500 09/26/12 0500 09/27/12 0600 09/28/12 0505 09/30/12 0725  WBC 4.1 3.4* 3.1* 3.9* 5.2  HGB 10.6* 9.4* 10.3* 9.1* 8.7*  HCT 32.8* 29.8* 33.5* 28.9* 28.0*  MCV 81.4 83.0 82.7 82.6 83.1  PLT 87* 135* PLATELET CLUMPS NOTED ON SMEAR, COUNT APPEARS DECREASED 160 194   Cardiac Enzymes:  Recent Labs Lab 09/27/12 1213 09/27/12 1815 09/27/12 2223  TROPONINI <0.30 <0.30 <0.30   BNP (last 3 results) No results found for this basename: PROBNP,  in the last 8760 hours CBG:  Recent Labs Lab 09/29/12 1702 09/29/12 2005 09/30/12 0011 09/30/12 0438 09/30/12 0755  GLUCAP 138* 174* 164* 115* 97    Recent Results (from the past 240 hour(s))  MRSA PCR SCREENING     Status: None   Collection Time    09/22/12  3:33 AM      Result Value Range Status   MRSA by PCR NEGATIVE  NEGATIVE Final   Comment:            The GeneXpert MRSA Assay (FDA     approved for NASAL specimens     only), is one component of a     comprehensive MRSA colonization     surveillance program. It is not     intended to diagnose MRSA     infection nor to guide or     monitor treatment for     MRSA infections.     Studies: Dg Abd Portable 1v  09/30/2012   *RADIOLOGY REPORT*  Clinical Data: Evaluate barium distribution prior  to percutaneous gastrostomy tube  PORTABLE ABDOMEN - 1 VIEW  Comparison: Prior abdominal radiograph 09/26/2012  Findings: The distribution of enteric contrast material which is noted throughout the transverse colon.  There is trace residual barium in the stomach.  A weighted tip enteric feeding tube is present with the tip in the prepyloric gastric antrum.  The bowel gas pattern is not obstructed.  The bones appear osteopenic.  Multilevel degenerative disc changes throughout the spine with levoconvex scoliosis.  Incompletely imaged fixation of a healed left intertrochanteric fracture with partially imaged intramedullary rod and a gamma nail.  IMPRESSION: Barium is well distributed throughout  the transverse colon.   Original Report Authenticated By: Malachy Moan, M.D.   Dg Swallowing Func-speech Pathology  09/29/2012   Riley Nearing Deblois, CCC-SLP     09/29/2012  1:48 PM Objective Swallowing Evaluation: Modified Barium Swallowing Study   Patient Details  Name: Rachel Benton MRN: [id number] Date of Birth: December 26, 1940  Today's Date: 09/29/2012 Time: 1130-1200 SLP Time Calculation (min): 30 min  Past Medical History:  Past Medical History  Diagnosis Date  . Hypertension   . Diabetes mellitus without complication   . Hyperlipidemia   . Back pain   . Insomnia   . Hyperlipidemia   . Stroke 2009  . Hemorrhoids   . History of recent fall 07/05/12  . Cancer 07/21/12    rectal, lymphadenopathy   Past Surgical History:  Past Surgical History  Procedure Laterality Date  . Hip arthroplasty Left 2009    fall injury  . Abdominal hysterectomy      TAH, > 37 yrs ago  . Bladder suspension      many yrs ago   HPI:  72 y.o. female, with recent diagnosis of rectal cancer with  rectal bleeding under the care of Dr. Truett Perna and Dr. Doylene Canard,  who completed chemoradiation 4 days ago, CVA in the past  affecting left sided extremities for which she had completely  recovered, type 2 diabetes mellitus, dyslipidemia, hypertension  who was in  her usual state of health till about 4 hours ago she  experienced sudden onset of left-sided weakness, facial droop and  got limp, this was noticed by family members who were around her,  she was brought to the ER, code stroke was called she was seen by  neurology. Head CT did not show any acute change, due to her  rectal cancer and history of rectal bleeding she was not a TPA  candidate, after neurology saw the patient they recommended  hospitalist admission.  CXR  Mild enlargement of cardiac  silhouette. No acute abnormalities.  Failed RN swallow screen due  to decreased level of alertness.  Daughter reports pt. recently  "choked on a pill and has had difficutly swallowing ever since."       Assessment / Plan / Recommendation Clinical Impression  Dysphagia Diagnosis: Severe oral phase dysphagia;Mild pharyngeal  phase dysphagia Clinical impression: Ms Blume presents with a severe oral based  dysphagia with left sided weakness, combined with briefly  sustained attention and decreased initiation, which all impair  her ability to accept and transit POs.   With max cues including verbal tactile, dry spoons, mixed  textures, positioning etc, the pt orally held boluses with  moderate to severe anterior spillage. Though pt was visibly  awake, she was intermittently aware of PO presentations. When a  swallow was elicited however the swallow response was delayed but  otherwise strong with no penetration or aspiration observed.   Given the effort required for minimal intake, the pt will not  nutritionally support herself orally. She is recommended to  initiate a diet with staff and family allowed to provide POs  given adequate pharyngeal function, in combination with non oral  nutrition. Discussed with dtr who is agreeable to long term  alternate nutrition, with awareness of some of the risks  associated with that method of feeding, but still with hopes that  pt will slowly regain ability to eat and drink with therapy and   time.     Treatment Recommendation  Therapy as outlined in treatment plan below  Diet Recommendation Dysphagia 1 (Puree);Nectar-thick liquid   Liquid Administration via: Spoon;Cup;Straw Medication Administration: Via alternative means Supervision: Staff feed patient;Trained caregiver to feed patient Compensations: Slow rate;Small sips/bites;Follow solids with  liquid;Check for pocketing;Check for anterior loss Postural Changes and/or Swallow Maneuvers: Seated upright 90  degrees;Upright 30-60 min after meal    Other  Recommendations Oral Care Recommendations: Oral care  before and after PO Other Recommendations: Order thickener from pharmacy   Follow Up Recommendations  Skilled Nursing facility    Frequency and Duration min 2x/week  2 weeks   Pertinent Vitals/Pain NA    SLP Swallow Goals Patient will utilize recommended strategies during swallow to  increase swallowing safety with: Maximal cueing;Total assistance   General HPI: 72 y.o. female, with recent diagnosis of rectal  cancer with rectal bleeding under the care of Dr. Truett Perna and  Dr. Doylene Canard, who completed chemoradiation 4 days ago, CVA in the  past affecting left sided extremities for which she had  completely recovered, type 2 diabetes mellitus, dyslipidemia,  hypertension who was in her usual state of health till about 4  hours ago she experienced sudden onset of left-sided weakness,  facial droop and got limp, this was noticed by family members who  were around her, she was brought to the ER, code stroke was  called she was seen by neurology. Head CT did not show any acute  change, due to her rectal cancer and history of rectal bleeding  she was not a TPA candidate, after neurology saw the patient they  recommended hospitalist admission.  CXR  Mild enlargement of  cardiac silhouette. No acute abnormalities.  Failed RN swallow  screen due to decreased level of alertness.  Daughter reports pt.  recently "choked on a pill and has had difficutly swallowing  ever  since."  Type of Study: Modified Barium Swallowing Study Reason for Referral: Objectively evaluate swallowing function Diet Prior to this Study: NPO Temperature Spikes Noted: No Respiratory Status: Room air History of Recent Intubation: No Behavior/Cognition: Alert;Requires cueing;Decreased sustained  attention;Cooperative Oral Cavity - Dentition: Edentulous Oral Motor / Sensory Function: Impaired - see Bedside swallow  eval Self-Feeding Abilities: Needs assist Patient Positioning: Upright in chair Baseline Vocal Quality: Clear Volitional Cough: Weak Volitional Swallow: Able to elicit Anatomy: Within functional limits Pharyngeal Secretions: Not observed secondary MBS    Reason for Referral Objectively evaluate swallowing function   Oral Phase     Pharyngeal Phase    Cervical Esophageal Phase    GO             Harlon Ditty, MA CCC-SLP [phone]  DeBlois, Riley Nearing 09/29/2012, 1:46 PM     Scheduled Meds: . antiseptic oral rinse  15 mL Mouth Rinse q12n4p  . aspirin  325 mg Per Tube Daily  . chlorhexidine  15 mL Mouth Rinse BID  . free water  100 mL Per Tube Q6H  . insulin aspart  0-15 Units Subcutaneous Q4H  . insulin aspart  4 Units Subcutaneous Q4H  . ketorolac  15 mg Intravenous Once  . metoprolol tartrate  25 mg Per Tube BID  . piperacillin-tazobactam (ZOSYN)  IV  3.375 g Intravenous Q8H  . simvastatin  40 mg Per Tube q1800   Continuous Infusions: . sodium chloride 250 mL (09/29/12 0520)  . [START ON 10/01/2012] feeding supplement (GLUCERNA 1.2 CAL)        Time spent: 25 minutes    Daiel Strohecker  [facility] Pager [phone]. If 7PM-7AM, please contact night-coverage at  [email or web address], password [email or web address] 09/30/2012, 1:50 PM  LOS: 9 days

## 2012-10-01 ENCOUNTER — Ambulatory Visit: Payer: Medicare Other | Admitting: Oncology

## 2012-10-01 ENCOUNTER — Other Ambulatory Visit: Payer: Medicare Other | Admitting: Lab

## 2012-10-01 DIAGNOSIS — R918 Other nonspecific abnormal finding of lung field: Secondary | ICD-10-CM | POA: Diagnosis not present

## 2012-10-01 DIAGNOSIS — Z8679 Personal history of other diseases of the circulatory system: Secondary | ICD-10-CM

## 2012-10-01 DIAGNOSIS — R5381 Other malaise: Secondary | ICD-10-CM | POA: Diagnosis present

## 2012-10-01 DIAGNOSIS — E43 Unspecified severe protein-calorie malnutrition: Secondary | ICD-10-CM

## 2012-10-01 DIAGNOSIS — R063 Periodic breathing: Secondary | ICD-10-CM

## 2012-10-01 LAB — GLUCOSE, CAPILLARY
Glucose-Capillary: 123 mg/dL — ABNORMAL HIGH (ref 70–99)
Glucose-Capillary: 161 mg/dL — ABNORMAL HIGH (ref 70–99)

## 2012-10-01 MED ORDER — METOPROLOL TARTRATE 25 MG PO TABS: 25.0000 mg | ORAL_TABLET | Freq: Two times a day (BID) | ORAL | Status: DC

## 2012-10-01 MED ORDER — RESOURCE THICKENUP CLEAR PO POWD
ORAL | Status: DC | PRN
  Filled 2012-10-01: qty 125

## 2012-10-01 MED ORDER — TRAMADOL-ACETAMINOPHEN 37.5-325 MG PO TABS: 1.0000 | ORAL_TABLET | Freq: Four times a day (QID) | ORAL | Status: DC | PRN

## 2012-10-01 MED ORDER — MORPHINE SULFATE 2 MG/ML IJ SOLN
2.0000 mg | Freq: Once | INTRAMUSCULAR | Status: AC
  Administered 2012-10-01: 2 mg via INTRAVENOUS
  Filled 2012-10-01: qty 1

## 2012-10-01 MED ORDER — GLUCERNA 1.2 CAL PO LIQD: 1000.0000 mL | ORAL | Status: DC

## 2012-10-01 MED ORDER — TRAMADOL-ACETAMINOPHEN 37.5-325 MG PO TABS
1.0000 | ORAL_TABLET | Freq: Four times a day (QID) | ORAL | Status: DC | PRN
  Administered 2012-10-01 (×2): 1 via ORAL
  Filled 2012-10-01 (×2): qty 1

## 2012-10-01 MED ORDER — CHLORHEXIDINE GLUCONATE 0.12 % MT SOLN: 15.0000 mL | Freq: Two times a day (BID) | OROMUCOSAL | Status: DC

## 2012-10-01 MED ORDER — ASPIRIN 325 MG PO TABS: 325.0000 mg | ORAL_TABLET | Freq: Every day | ORAL | Status: DC

## 2012-10-01 MED ORDER — POTASSIUM CHLORIDE CRYS ER 20 MEQ PO TBCR
40.0000 meq | EXTENDED_RELEASE_TABLET | Freq: Once | ORAL | Status: AC
  Administered 2012-10-01: 40 meq via ORAL
  Filled 2012-10-01: qty 2

## 2012-10-01 MED ORDER — AMOXICILLIN-POT CLAVULANATE 875-125 MG PO TABS: 1.0000 | ORAL_TABLET | Freq: Two times a day (BID) | ORAL | Status: DC

## 2012-10-01 NOTE — Progress Notes (Signed)
Subjective: [name] G tube placed 8/13 Looks great; pt has no complaint May use now  Objective: Vital signs in last 24 hours: Temp:  [97.7 F (36.5 C)-98.8 F (37.1 C)] 97.7 F (36.5 C) (08/14 0656) Pulse Rate:  [93-108] 108 (08/14 0656) Resp:  [18-30] 20 (08/14 0656) BP: (131-158)/(64-101) 140/78 mmHg (08/14 0656) SpO2:  [93 %-100 %] 100 % (08/14 0656) Weight:  [149 lb 9 oz (67.841 kg)] 149 lb 9 oz (67.841 kg) (08/14 0500) Last BM Date: 09/30/12  Intake/Output from previous day:   Intake/Output this shift:    PE: afeb; vss G tube site clean and dry NT; no bleeding +BS   Lab Results:   Recent Labs  09/30/12 0725  WBC 5.2  HGB 8.7*  HCT 28.0*  PLT 194   BMET  Recent Labs  09/29/12 0535 09/30/12 0725  NA 137 138  K 3.7 3.2*  CL 101 102  CO2 24 27  GLUCOSE 168* 95  BUN 17 18  CREATININE 0.26* 0.30*  CALCIUM 8.3* 8.2*   PT/INR  Recent Labs  09/30/12 0725  LABPROT 15.3*  INR 1.24   ABG No results found for this basename: PHART, PCO2, PO2, HCO3,  in the last 72 hours  Studies/Results: Ir Gastrostomy Tube Mod Sed  09/30/2012   *RADIOLOGY REPORT*  PULL THROUGH GASTROSTOMY TUBE PLACEMENT UNDER FLUOROSCOPIC GUIDANCE  Date: 09/30/2012  Clinical History: 72 year old female with history of cerebrovascular accident and dysphagia.  Percutaneous gastrostomy tube required for nutrition prior to discharge to a skilled nursing facility.  Procedures Performed: 1.  Fluoroscopically guided placement of percutaneous pull-through gastrostomy tube.  Interventional Radiologist:  Sterling Big, MD  Sedation: Moderate (conscious) sedation was used.  1 mg Versed, 50 mcg Fentanyl were administered intravenously.  The patient's vital signs were monitored continuously by radiology nursing throughout the procedure.  Sedation Time: 15 minutes  Fluoroscopy time: 1 minute 54 seconds  Contrast volume: 25 ml Omnipaque-300  Antibiotics:  2 grams Ancef was administered intravenously  within 1 hour of skin incision.  PROCEDURE/FINDINGS:   Informed consent was obtained from the patient following explanation of the procedure, risks, benefits and alternatives. The patient understands, agrees and consents for the procedure. All questions were addressed. A time out was performed.  Maximal barrier sterile technique utilized including caps, mask, sterile gowns, sterile gloves, large sterile drape, hand hygiene, and chlorhexadine skin prep.  An angled catheter was advanced over a wire under fluoroscopic guidance through the nose, down the esophagus and into the body of the stomach.  The stomach was then insufflated with several 100 ml of air.  Fluoroscopy confirmed location of the gastric bubble, as well as inferior displacement of the barium stained colon.  Under direct fluoroscopic guidance, a single T-tack was placed, and the anterior gastric wall drawn up against the anterior abdominal wall. Percutaneous access was then obtained into the mid gastric body with an 18 gauge sheath needle.  Aspiration of air, and injection of contrast material under fluoroscopy confirmed needle placement.  An Amplatz wire was advanced in the gastric body and the access needle exchanged for a 9-French vascular sheath.  A snare device was advanced through the vascular sheath and an Amplatz wire advanced through the angled catheter.  The Amplatz wire was successfully snared and this was pulled up through the esophagus and out the mouth.  A 20-French Burnell Blanks MIC-PEG tube was then connected to the snare and pulled through the mouth, down the esophagus, into the stomach and  out to the anterior abdominal wall. Hand injection of contrast material confirmed intragastric location. The T-tack retention suture was then cut. The pull through peg tube was then secured with the external bumper and capped.  The patient will be observed for several hours with the newly placed tube on low wall suction to evaluate for any post  procedure complication.  The patient tolerated the procedure well, there is no immediate complication.  IMPRESSION:  Successful placement of a 20 French pull through gastrostomy tube.  The tube will be ready for use tomorrow, 10/01/2012, at noon.  Signed,  Sterling Big, MD Vascular & Interventional Radiologist [facility]   Original Report Authenticated By: Malachy Moan, M.D.   Dg Abd Portable 1v  09/30/2012   *RADIOLOGY REPORT*  Clinical Data: Evaluate barium distribution prior to percutaneous gastrostomy tube  PORTABLE ABDOMEN - 1 VIEW  Comparison: Prior abdominal radiograph 09/26/2012  Findings: The distribution of enteric contrast material which is noted throughout the transverse colon.  There is trace residual barium in the stomach.  A weighted tip enteric feeding tube is present with the tip in the prepyloric gastric antrum.  The bowel gas pattern is not obstructed.  The bones appear osteopenic.  Multilevel degenerative disc changes throughout the spine with levoconvex scoliosis.  Incompletely imaged fixation of a healed left intertrochanteric fracture with partially imaged intramedullary rod and a gamma nail.  IMPRESSION: Barium is well distributed throughout the transverse colon.   Original Report Authenticated By: Malachy Moan, M.D.   Dg Swallowing Func-speech Pathology  09/29/2012   Riley Nearing Deblois, CCC-SLP     09/29/2012  1:48 PM Objective Swallowing Evaluation: Modified Barium Swallowing Study   Patient Details  Name: Rachel Benton MRN: [id number] Date of Birth: 09/19/1940  Today's Date: 09/29/2012 Time: 1130-1200 SLP Time Calculation (min): 30 min  Past Medical History:  Past Medical History  Diagnosis Date  . Hypertension   . Diabetes mellitus without complication   . Hyperlipidemia   . Back pain   . Insomnia   . Hyperlipidemia   . Stroke 2009  . Hemorrhoids   . History of recent fall 07/05/12  . Cancer 07/21/12    rectal, lymphadenopathy   Past Surgical History:  Past  Surgical History  Procedure Laterality Date  . Hip arthroplasty Left 2009    fall injury  . Abdominal hysterectomy      TAH, > 37 yrs ago  . Bladder suspension      many yrs ago   HPI:  72 y.o. female, with recent diagnosis of rectal cancer with  rectal bleeding under the care of Dr. Truett Perna and Dr. Doylene Canard,  who completed chemoradiation 4 days ago, CVA in the past  affecting left sided extremities for which she had completely  recovered, type 2 diabetes mellitus, dyslipidemia, hypertension  who was in her usual state of health till about 4 hours ago she  experienced sudden onset of left-sided weakness, facial droop and  got limp, this was noticed by family members who were around her,  she was brought to the ER, code stroke was called she was seen by  neurology. Head CT did not show any acute change, due to her  rectal cancer and history of rectal bleeding she was not a TPA  candidate, after neurology saw the patient they recommended  hospitalist admission.  CXR  Mild enlargement of cardiac  silhouette. No acute abnormalities.  Failed RN swallow screen due  to decreased level of alertness.  Daughter  reports pt. recently  "choked on a pill and has had difficutly swallowing ever since."       Assessment / Plan / Recommendation Clinical Impression  Dysphagia Diagnosis: Severe oral phase dysphagia;Mild pharyngeal  phase dysphagia Clinical impression: Ms Soule presents with a severe oral based  dysphagia with left sided weakness, combined with briefly  sustained attention and decreased initiation, which all impair  her ability to accept and transit POs.   With max cues including verbal tactile, dry spoons, mixed  textures, positioning etc, the pt orally held boluses with  moderate to severe anterior spillage. Though pt was visibly  awake, she was intermittently aware of PO presentations. When a  swallow was elicited however the swallow response was delayed but  otherwise strong with no penetration or aspiration  observed.   Given the effort required for minimal intake, the pt will not  nutritionally support herself orally. She is recommended to  initiate a diet with staff and family allowed to provide POs  given adequate pharyngeal function, in combination with non oral  nutrition. Discussed with dtr who is agreeable to long term  alternate nutrition, with awareness of some of the risks  associated with that method of feeding, but still with hopes that  pt will slowly regain ability to eat and drink with therapy and  time.     Treatment Recommendation  Therapy as outlined in treatment plan below    Diet Recommendation Dysphagia 1 (Puree);Nectar-thick liquid   Liquid Administration via: Spoon;Cup;Straw Medication Administration: Via alternative means Supervision: Staff feed patient;Trained caregiver to feed patient Compensations: Slow rate;Small sips/bites;Follow solids with  liquid;Check for pocketing;Check for anterior loss Postural Changes and/or Swallow Maneuvers: Seated upright 90  degrees;Upright 30-60 min after meal    Other  Recommendations Oral Care Recommendations: Oral care  before and after PO Other Recommendations: Order thickener from pharmacy   Follow Up Recommendations  Skilled Nursing facility    Frequency and Duration min 2x/week  2 weeks   Pertinent Vitals/Pain NA    SLP Swallow Goals Patient will utilize recommended strategies during swallow to  increase swallowing safety with: Maximal cueing;Total assistance   General HPI: 72 y.o. female, with recent diagnosis of rectal  cancer with rectal bleeding under the care of Dr. Truett Perna and  Dr. Doylene Canard, who completed chemoradiation 4 days ago, CVA in the  past affecting left sided extremities for which she had  completely recovered, type 2 diabetes mellitus, dyslipidemia,  hypertension who was in her usual state of health till about 4  hours ago she experienced sudden onset of left-sided weakness,  facial droop and got limp, this was noticed by family members  who  were around her, she was brought to the ER, code stroke was  called she was seen by neurology. Head CT did not show any acute  change, due to her rectal cancer and history of rectal bleeding  she was not a TPA candidate, after neurology saw the patient they  recommended hospitalist admission.  CXR  Mild enlargement of  cardiac silhouette. No acute abnormalities.  Failed RN swallow  screen due to decreased level of alertness.  Daughter reports pt.  recently "choked on a pill and has had difficutly swallowing ever  since."  Type of Study: Modified Barium Swallowing Study Reason for Referral: Objectively evaluate swallowing function Diet Prior to this Study: NPO Temperature Spikes Noted: No Respiratory Status: Room air History of Recent Intubation: No Behavior/Cognition: Alert;Requires cueing;Decreased sustained  attention;Cooperative Oral Cavity -  Dentition: Edentulous Oral Motor / Sensory Function: Impaired - see Bedside swallow  eval Self-Feeding Abilities: Needs assist Patient Positioning: Upright in chair Baseline Vocal Quality: Clear Volitional Cough: Weak Volitional Swallow: Able to elicit Anatomy: Within functional limits Pharyngeal Secretions: Not observed secondary MBS    Reason for Referral Objectively evaluate swallowing function   Oral Phase     Pharyngeal Phase    Cervical Esophageal Phase    GO             Harlon Ditty, MA CCC-SLP [phone]  Claudine Mouton 09/29/2012, 1:46 PM     Anti-infectives: Anti-infectives   Start     Dose/Rate Route Frequency Ordered Stop   09/30/12 1400  piperacillin-tazobactam (ZOSYN) IVPB 3.375 g     3.375 g 12.5 mL/hr over 240 Minutes Intravenous 3 times per day 09/30/12 0956     09/27/12 1400  piperacillin-tazobactam (ZOSYN) IVPB 3.375 g  Status:  Discontinued     3.375 g 100 mL/hr over 30 Minutes Intravenous 3 times per day 09/27/12 1148 09/30/12 0956   09/25/12 1930  cefTRIAXone (ROCEPHIN) 1 g in dextrose 5 % 50 mL IVPB  Status:  Discontinued      1 g 100 mL/hr over 30 Minutes Intravenous Every 24 hours 09/25/12 1909 09/27/12 1148   09/22/12 1000  cefTRIAXone (ROCEPHIN) 1 g in dextrose 5 % 50 mL IVPB     1 g 100 mL/hr over 30 Minutes Intravenous Every 24 hours 09/22/12 0857 09/24/12 1047      Assessment/Plan: s/p * No surgery found *  May use G tube now   LOS: 10 days    Makenzye Troutman A 10/01/2012

## 2012-10-01 NOTE — Progress Notes (Signed)
Pt to d/c to [facility] this pm.  CSW to call [facility] once her dtr. Philbert Riser, completes paperwork at the facility.

## 2012-10-01 NOTE — Progress Notes (Signed)
Speech Language Pathology Dysphagia Treatment Patient Details Name: ANABELEN KAMINSKY MRN: [id number] DOB: [birth year] Today's Date: 10/01/2012 Time: 1340-1400 SLP Time Calculation (min): 20 min  Assessment / Plan / Recommendation Clinical Impression  Pts responsiveness and initiation significantly improved today, only requiring min verbal cues to orally manipluate bolus and trigger swallow. Pt requesting POs. With total assist pt consume puree and nectar thick liquids with moderate oral dysphagia, without evidence of aspiration, consistent with MBS finding. Will initiate diet in addition to PEG tube feedings. Pts family instructed on feeding, RN also aware. SLP will follow for tolerance, pt should have SLP at SNF.     Diet Recommendation  Initiate / Change Diet: Nectar-thick liquid;Dysphagia 1 (puree)    SLP Plan     Pertinent Vitals/Pain NA   Swallowing Goals  SLP Swallowing Goals Patient will utilize recommended strategies during swallow to increase swallowing safety with: Maximal cueing;Total assistance Swallow Study Goal #2 - Progress: Progressing toward goal  General Temperature Spikes Noted: No  Oral Cavity - Oral Hygiene     Dysphagia Treatment Treatment focused on: Upgraded PO texture trials;Patient/family/caregiver education Family/Caregiver Educated: husband Treatment Methods/Modalities: Skilled observation Patient observed directly with PO's: Yes Type of PO's observed: Dysphagia 1 (puree);Ice chips;Nectar-thick liquids Feeding: Total assist Liquids provided via: Teaspoon Oral Phase Signs & Symptoms: Prolonged oral phase;Prolonged bolus formation;Anterior loss/spillage Pharyngeal Phase Signs & Symptoms: Suspected delayed swallow initiation Type of cueing: Verbal;Tactile Amount of cueing: Moderate   GO    Harlon Ditty, [place] CCC-SLP [phone]  Claudine Mouton 10/01/2012, 2:39 PM

## 2012-10-01 NOTE — Discharge Summary (Addendum)
Physician Discharge Summary  Rachel Benton ZOX:[id number] DOB: 1940/12/09 DOA: 09/21/2012  PCP: Elby Showers, MD  Admit date: 09/21/2012 Discharge date: 10/01/2012  Time spent: 40 minutes  Recommendations for Outpatient Follow-up:  1. Discharge to SNF 2. Follow up with Dr Pearlean Brownie in 2 months 3. Follow up with Dr Myrle Sheng as outpt. had appt today which she will miss. i have notified his office to reschedule.  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 This will provide 1584 kcal, 79 gm protein, and 1063 ml free water daily.    Filed Weights   09/29/12 0600 09/30/12 0500 10/01/12 0500  Weight: 67.5 kg (148 lb 13 oz) 69 kg (152 lb 1.9 oz) 67.841 kg (149 lb 9 oz)    History of present illness:  Please refer to admission H&P for details, but in brief, 72 y.o. female, with recently diagnosed rectal cancer with rectal bleeding followed by Dr. Truett Perna and Dr. 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 This was noticed by family members who were around her. She was brought to the ER, code stroke was called and she was seen by Neurology. Head CT did not show any acute change. Due to her rectal cancer and  history of rectal bleeding she was not a TPA candidate. Due to the patient's significant somnolence and dysphagia, the patient was initially admitted to step down unit. She was transferred to Neuro floor on 09/25/12.    Hospital Course:  Right MCA infarct with cytotoxic cerebral edema and acute to subacute lacunar infarct in the posterior left MCA territory  -patient's mentation has been progressively improving now although becomes somnolent off and on.  Event felt to be thromboembolic secondary to occluded right M1 noted on MRA. no significant carotid stenosis.  2D echo showed normal EF of 55-60% and grade 1 diastolic dysfunction. no further w/u indicated per Stroke Team.  -continue ASA and statins.  Dysphagia  -MBS repeated on 8/12  -discussed with speech therapy. although patient cleared swallow eval, pt currently unable to take enough po nutrition due to deconditioning  -patient on admission failed swallow function and an NG feeding tube placed.  -after discussion family agreed upon a gastrostomy feeding tube which was placed by IR on 8/13. Starting glucerna feeds this morning and titrating rate up as outlined in diet recommendation above..  tachypnea  -likely Cheyne-Stokes respirations from the patient's acute stroke vs LLL infiltrate/ atalectasis  -currently improving. TSH wnl.   LLL infiltrate/atelectasis  Empirically started on zosyn given hypoxemia, tachypnea and dysphagia ( day4/7)  transition to oral augmenting upon discharge ( completes abx on 8/16)  DIABETES MELLITUS, TYPE II  -hyperglycemia due to Jevity feeds and thus switched to glucerna.  Added novolog and her fsg has been stable.    Hypokalemia  -repleted  Hyperlipidemia  On zocor   HYPERTENSION  BP currently stable  -continue metoprolol tartrate 25 mg twice a day   BACK PAIN, CHRONIC  Given Prn tramadol for pain . 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 Was on xeloda with radiation which she is not taking now.  - outpatient followup with Dr. Truett Perna . She had appt today but needs to be rescheduled.   Protein-calorie malnutrition, severe   was getting tube feeding with PANDA tube and now G tube placed and resumed feeding  Anemia  possibly related to rectal ca. Monitor as ou   Code Status: FULL  Disposition Plan: SNF with outpt follow up  Consultants:  Neurology  IR  Procedures:  8/5 - Cartotid dopplers - Bilateral: 1-39% ICA stenosis  8/5 - TTE - EF 55-60% - grade1 DD - no obvious source of emboli   Antibiotics:  Rocephin 8/5 >> 09/27/2012  Zosyn 09/27/12>>> 8/14. Augmentin 8/14-8/16  Family Communication: family at beside        Discharge Exam: Filed Vitals:   10/01/12 0656  BP: 140/78  Pulse: 108  Temp: 97.7 F (36.5 C)  Resp: 20    General: Elderly thin built female in NAD, sleepy  HEENT: no pallor, dry oral mucosa, PANDA tube  Chest: Clear b/l, no added sounds  CVS: NS1&S2, no murmurs  Abd: soft, NT, ND, BS+ , G tube in place Ex: warm, no edema  CNS: AAOX3, fatigued,  left hemiplegia, jerky movements of rt foot  Discharge Instructions   Future Appointments Provider Department Dept Phone   10/01/2012 3:00 PM Krista Blue [facility] [phone]   10/01/2012 3:15 PM Ladene Artist, MD [facility] MEDICAL ONCOLOGY [phone]   10/15/2012 11:00 AM Billie Lade, MD [facility] RADIATION ONCOLOGY [phone]       Medication List    STOP taking these medications       fluconazole 100 MG tablet  Commonly known as:  DIFLUCAN    capecitabine 500 MG tablet  Commonly known as:  XELODA  - Take #3 (1500mg ) po every am & #2 (1000mg ) po every pm = 2500 mg total daily dose  - Take on days of radiation therapy only M-F    TAKE these medications       Aloe Vera Liqd  Take 60 mLs by mouth 3 (three) times daily. Mixed in  juice     ALPRAZolam 0.25 MG tablet  Commonly known as:  XANAX  Take 0.25 mg by mouth 3 (three) times daily as needed for anxiety.     amoxicillin-clavulanate 875-125 MG per tablet  Commonly known as:  AUGMENTIN  Take 1 tablet by mouth 2 (two) times daily.     aspirin 325 MG tablet  Place 1 tablet (325 mg total) into feeding tube daily.              chlorhexidine 0.12 % solution  Commonly known as:  PERIDEX  Use as directed 15 mL in the mouth or throat 2 (two) times daily.     emollient cream  Commonly known as:  BIAFINE  Apply 1 application topically See admin instructions. To bottom after every bathroom trip     feeding supplement (GLUCERNA 1.2 CAL) Liqd  Place 1,000 mL into feeding tube continuous.  fentaNYL 25 MCG/HR patch  Commonly known as:  DURAGESIC - dosed mcg/hr  Place 1 patch onto the skin every 3 (three) days.     ferrous sulfate 325 (65 FE) MG tablet  Take 325 mg by mouth 2 (two) times daily.     metFORMIN 1000 MG tablet  Commonly known as:  GLUCOPHAGE  Take 1,000 mg by mouth 2 (two) times daily with a meal.     metoprolol tartrate 25 MG tablet  Commonly known as:  LOPRESSOR  Place 1 tablet (25 mg total) into feeding tube 2 (two) times daily.     ondansetron 4 MG tablet  Commonly known as:  ZOFRAN  Take 4 mg by mouth every 8 (eight) hours as needed for nausea.     OVER THE COUNTER MEDICATION  Radiology cream to radiation burns on her mid section three times daily     polyethylene glycol packet  Commonly known as:  MIRALAX / GLYCOLAX  Take 17 g by mouth daily as needed (constipation).     pravastatin 40 MG tablet  Commonly known as:  PRAVACHOL  Take 40 mg by mouth daily.     prochlorperazine 10 MG tablet  Commonly known as:  COMPAZINE  Take 10 mg by mouth every 6 (six) hours as needed (nausea).     traMADol-acetaminophen 37.5-325 MG per tablet  Commonly known as:  ULTRACET  Take 1 tablet by mouth every 6 (six) hours as needed for pain.        Allergies  Allergen Reactions  . Ace Inhibitors   . Fish Oil     REACTION: GI  . Glipizide     REACTION: hypoglycemia  . Ibuprofen   . Rosiglitazone Maleate     REACTION: edema  . Sulfonamide Derivatives        Follow-up Information   Follow up with SETHI,PRAMODKUMAR P, MD. Schedule an appointment as soon as possible for a visit in 2 months. (stroke clinic)    Specialties:  Neurology, Radiology   Contact information:   [address] [place] [address] [phone]       Follow up with Elby Showers, MD In 1 week. (After discharged from SNF)    Specialty:  Internal Medicine   Contact information:   [address] [place] [address] [place] [address] [phone]        The results of significant diagnostics from this hospitalization (including imaging, microbiology, ancillary and laboratory) are listed below for reference.    Significant Diagnostic Studies: Ct Head Wo Contrast  09/21/2012   *RADIOLOGY REPORT*  Clinical Data: 72 year old female - code stroke - altered mental status.  CT HEAD WITHOUT CONTRAST  Technique:  Contiguous axial images were obtained from the base of the skull through the vertex without contrast.  Comparison: 06/15/2008 CT and MRI  Findings: Remote right cerebellar and right thalamic infarcts noted as well as mild chronic small vessel white matter ischemic changes.  No acute intracranial abnormalities are identified, including mass lesion or mass effect, hydrocephalus, extra-axial fluid collection, midline shift, hemorrhage, or acute infarction.  The visualized bony calvarium is unremarkable.  IMPRESSION: No evidence of acute intracranial abnormality.  Remote right cerebellar and right thalamic infarcts and mild chronic small vessel white matter ischemic changes.   Original Report Authenticated By: Harmon Pier, M.D.   Mr Brain Wo Contrast  09/22/2012   *RADIOLOGY REPORT*  Clinical Data:  72 year old female with sudden onset left  side weakness and facial droop.  Not at  tPA candidate due to history of rectal cancer/rectal bleeding.  Recent diagnosis of rectal cancer. History of prior stroke affecting the left side.  Comparison: Head CT without contrast 09/21/2012.  Brain MRI/MRA 06/16/2008.  MRI HEAD WITHOUT CONTRAST  Technique: Multiplanar, multiecho pulse sequences of the brain and surrounding structures were obtained according to standard protocol without intravenous contrast.  Findings: Confluent restricted diffusion throughout much of the right MCA territory, affecting cortex and white matter.  Basal ganglia partially affected.  Overall moderate stroke size. Associated cytotoxic edema.  No midline shift or significant mass effect at this time.  No associated acute intracranial hemorrhage. Occasional small foci of restricted diffusion also in the right occipital lobe.  No posterior fossa involvement.  There could be a punctate area of restricted diffusion also in the left posterior MCA territory, left parietal lobe on series 4 image 18.  Major intracranial vascular flow voids are stable at the skull base, but the right MCA M1 segment may now be abnormal.  See MRA findings below.  Interval expected evolution of the right thalamic lacunar infarct which occurred in 2010.  Chronic right cerebellar infarct is stable.  No ventriculomegaly.  Negative pituitary, cervicomedullary junction and visualized cervical spine.  No extra-axial collection. No vasogenic cerebral edema is identified.  Bone marrow signal now is mostly abnormal in the skull and visible cervical spine.  Marrow signal is normal in 2010.  Visualized orbit soft tissues are within normal limits.  Mild mastoid effusions greater on the right.  Stable paranasal sinuses. Negative scalp soft tissues.  IMPRESSION: 1.  Moderate sized confluent right MCA infarct with cytotoxic edema. See MRA findings below.  No associated hemorrhage or mass effect at this time. 2.  Possible superimposed  acute to subacute lacunar infarct in the posterior left MCA territory. 3.  Otherwise, stable brain with expected evolution of the 2010 right thalamus lacunar infarct. 4.  Bone marrow signal has become abnormal since 2010.  This could be related to anemia due to GI bleeding or cancer therapy. Metastatic disease to bone is less likely. 5.  Mild mastoid effusions.  MRA HEAD WITHOUT CONTRAST  Technique: Angiographic images of the Circle of Willis were obtained using MRA technique without  intravenous contrast.  Findings: Artifact at the cervicomedullary junction.  Stable diminutive posterior circulation with antegrade flow in the distal vertebral arteries.  Patent PICA origins.  Patent basilar artery without stenosis.  Fetal type bilateral PCA origins re- identified.  Chronic left PCA superior division origin stenosis. Chronic tandem stenoses in the right PCA distal P1 and P2 segment, not significantly changed.  Stable antegrade flow in both ICA siphons.  No ICA stenosis. Patent carotid termini.  Bilateral ACA and left MCA origins are patent.  Diminutive or absent anterior communicating artery.  Visualized bilateral ACA branches are within normal limits.  Stable visualized left MCA branches.  Right MCA occlusion just beyond the M1 segment origin.  No distal right MCA flow.  IMPRESSION: 1.  New right MCA occlusion just beyond its origin. 2.  Otherwise stable intracranial MRA including proximal right PCA tandem stenoses and distal left PCA stenosis.  Salient findings discussed with Dr. Leroy Kennedy by telephone at 2020 hours on 09/22/2012.   Original Report Authenticated By: Erskine Speed, M.D.   Ir Gastrostomy Tube Mod Sed  09/30/2012   *RADIOLOGY REPORT*  PULL THROUGH GASTROSTOMY TUBE PLACEMENT UNDER FLUOROSCOPIC GUIDANCE  Date: 09/30/2012  Clinical History: 72 year old female with history of cerebrovascular accident and dysphagia.  Percutaneous gastrostomy tube  required for nutrition prior to discharge to a skilled nursing  facility.  Procedures Performed: 1.  Fluoroscopically guided placement of percutaneous pull-through gastrostomy tube.  Interventional Radiologist:  Sterling Big, MD  Sedation: Moderate (conscious) sedation was used.  1 mg Versed, 50 mcg Fentanyl were administered intravenously.  The patient's vital signs were monitored continuously by radiology nursing throughout the procedure.  Sedation Time: 15 minutes  Fluoroscopy time: 1 minute 54 seconds  Contrast volume: 25 ml Omnipaque-300  Antibiotics:  2 grams Ancef was administered intravenously within 1 hour of skin incision.  PROCEDURE/FINDINGS:   Informed consent was obtained from the patient following explanation of the procedure, risks, benefits and alternatives. The patient understands, agrees and consents for the procedure. All questions were addressed. A time out was performed.  Maximal barrier sterile technique utilized including caps, mask, sterile gowns, sterile gloves, large sterile drape, hand hygiene, and chlorhexadine skin prep.  An angled catheter was advanced over a wire under fluoroscopic guidance through the nose, down the esophagus and into the body of the stomach.  The stomach was then insufflated with several 100 ml of air.  Fluoroscopy confirmed location of the gastric bubble, as well as inferior displacement of the barium stained colon.  Under direct fluoroscopic guidance, a single T-tack was placed, and the anterior gastric wall drawn up against the anterior abdominal wall. Percutaneous access was then obtained into the mid gastric body with an 18 gauge sheath needle.  Aspiration of air, and injection of contrast material under fluoroscopy confirmed needle placement.  An Amplatz wire was advanced in the gastric body and the access needle exchanged for a 9-French vascular sheath.  A snare device was advanced through the vascular sheath and an Amplatz wire advanced through the angled catheter.  The Amplatz wire was successfully snared and this  was pulled up through the esophagus and out the mouth.  A 20-French Burnell Blanks MIC-PEG tube was then connected to the snare and pulled through the mouth, down the esophagus, into the stomach and out to the anterior abdominal wall. Hand injection of contrast material confirmed intragastric location. The T-tack retention suture was then cut. The pull through peg tube was then secured with the external bumper and capped.  The patient will be observed for several hours with the newly placed tube on low wall suction to evaluate for any post procedure complication.  The patient tolerated the procedure well, there is no immediate complication.  IMPRESSION:  Successful placement of a 20 French pull through gastrostomy tube.  The tube will be ready for use tomorrow, 10/01/2012, at noon.  Signed,  Sterling Big, MD Vascular & Interventional Radiologist [facility] Radiology   Original Report Authenticated By: Malachy Moan, M.D.   Dg Chest Port 1 View  09/27/2012   *RADIOLOGY REPORT*  Clinical Data: Tachypnea  PORTABLE CHEST - 1 VIEW  Comparison: 09/21/2012  Findings: The lower lobe airspace disease has progressed.  This may be atelectasis or pneumonia.  No heart failure.  Right lung is clear.  The feeding tube extends into the stomach with the tip of the tube not visualized.  IMPRESSION: Progression of left lower lobe atelectasis/infiltrate.   Original Report Authenticated By: Janeece Riggers, M.D.   Dg Chest Port 1 View  09/21/2012   *RADIOLOGY REPORT*  Clinical Data: Cough, weakness, question stroke  PORTABLE CHEST - 1 VIEW  Comparison: Portable exam 1627 hours compared to 06/15/2008  Findings: Enlargement of cardiac silhouette. Tortuous aorta with atherosclerotic calcification at arch. Mediastinal  contours and pulmonary vascularity normal. Lungs clear. No pleural effusion or pneumothorax. Bones demineralized.  IMPRESSION: Mild enlargement of cardiac silhouette. No acute abnormalities.   Original Report  Authenticated By: Ulyses Southward, M.D.   Dg Abd Portable 1v  09/30/2012   *RADIOLOGY REPORT*  Clinical Data: Evaluate barium distribution prior to percutaneous gastrostomy tube  PORTABLE ABDOMEN - 1 VIEW  Comparison: Prior abdominal radiograph 09/26/2012  Findings: The distribution of enteric contrast material which is noted throughout the transverse colon.  There is trace residual barium in the stomach.  A weighted tip enteric feeding tube is present with the tip in the prepyloric gastric antrum.  The bowel gas pattern is not obstructed.  The bones appear osteopenic.  Multilevel degenerative disc changes throughout the spine with levoconvex scoliosis.  Incompletely imaged fixation of a healed left intertrochanteric fracture with partially imaged intramedullary rod and a gamma nail.  IMPRESSION: Barium is well distributed throughout the transverse colon.   Original Report Authenticated By: Malachy Moan, M.D.   Dg Abd Portable 1v  09/26/2012   *RADIOLOGY REPORT*  Clinical Data: Feeding tube placement  PORTABLE ABDOMEN - 1 VIEW  Comparison: September 23, 2012  Findings: Tip of the feeding tube is in the antrum of the stomach. Moderate stool in the ascending colon.  Mild distention of the colon.  IMPRESSION: Feeding tube tip is in the antrum of the stomach.   Original Report Authenticated By: Jolaine Click, M.D.   Dg Abd Portable 1v  09/23/2012   *RADIOLOGY REPORT*  Clinical Data: Feeding tube placement.  PORTABLE ABDOMEN - 1 VIEW  Comparison: None.  Findings: Single portable view of the abdomen submitted.  There is nonspecific nonobstructive bowel gas pattern.  There is a NG feeding tube with tip in distal gastric region.  IMPRESSION: NG feeding tube with tip in distal gastric region.   Original Report Authenticated By: Natasha Mead, M.D.   Dg Swallowing Func-speech Pathology  09/29/2012   Riley Nearing Deblois, CCC-SLP     09/29/2012  1:48 PM Objective Swallowing Evaluation: Modified Barium Swallowing Study    Patient Details  Name: SHANELL ADEN MRN: [id number] Date of Birth: 1940-02-25  Today's Date: 09/29/2012 Time: 1130-1200 SLP Time Calculation (min): 30 min  Past Medical History:  Past Medical History  Diagnosis Date  . Hypertension   . Diabetes mellitus without complication   . Hyperlipidemia   . Back pain   . Insomnia   . Hyperlipidemia   . Stroke 2009  . Hemorrhoids   . History of recent fall 07/05/12  . Cancer 07/21/12    rectal, lymphadenopathy   Past Surgical History:  Past Surgical History  Procedure Laterality Date  . Hip arthroplasty Left 2009    fall injury  . Abdominal hysterectomy      TAH, > 37 yrs ago  . Bladder suspension      many yrs ago   HPI:  72 y.o. female, with recent diagnosis of rectal cancer with  rectal bleeding under the care of Dr. Truett Perna and Dr. Doylene Canard,  who completed chemoradiation 4 days ago, CVA in the past  affecting left sided extremities for which she had completely  recovered, type 2 diabetes mellitus, dyslipidemia, hypertension  who was in her usual state of health till about 4 hours ago she  experienced sudden onset of left-sided weakness, facial droop and  got limp, this was noticed by family members who were around her,  she was brought to the ER, code stroke was called she  was seen by  neurology. Head CT did not show any acute change, due to her  rectal cancer and history of rectal bleeding she was not a TPA  candidate, after neurology saw the patient they recommended  hospitalist admission.  CXR  Mild enlargement of cardiac  silhouette. No acute abnormalities.  Failed RN swallow screen due  to decreased level of alertness.  Daughter reports pt. recently  "choked on a pill and has had difficutly swallowing ever since."       Assessment / Plan / Recommendation Clinical Impression  Dysphagia Diagnosis: Severe oral phase dysphagia;Mild pharyngeal  phase dysphagia Clinical impression: Ms Louischarles presents with a severe oral based  dysphagia with left sided weakness, combined with  briefly  sustained attention and decreased initiation, which all impair  her ability to accept and transit POs.   With max cues including verbal tactile, dry spoons, mixed  textures, positioning etc, the pt orally held boluses with  moderate to severe anterior spillage. Though pt was visibly  awake, she was intermittently aware of PO presentations. When a  swallow was elicited however the swallow response was delayed but  otherwise strong with no penetration or aspiration observed.   Given the effort required for minimal intake, the pt will not  nutritionally support herself orally. She is recommended to  initiate a diet with staff and family allowed to provide POs  given adequate pharyngeal function, in combination with non oral  nutrition. Discussed with dtr who is agreeable to long term  alternate nutrition, with awareness of some of the risks  associated with that method of feeding, but still with hopes that  pt will slowly regain ability to eat and drink with therapy and  time.     Treatment Recommendation  Therapy as outlined in treatment plan below    Diet Recommendation Dysphagia 1 (Puree);Nectar-thick liquid   Liquid Administration via: Spoon;Cup;Straw Medication Administration: Via alternative means Supervision: Staff feed patient;Trained caregiver to feed patient Compensations: Slow rate;Small sips/bites;Follow solids with  liquid;Check for pocketing;Check for anterior loss Postural Changes and/or Swallow Maneuvers: Seated upright 90  degrees;Upright 30-60 min after meal    Other  Recommendations Oral Care Recommendations: Oral care  before and after PO Other Recommendations: Order thickener from pharmacy   Follow Up Recommendations  Skilled Nursing facility    Frequency and Duration min 2x/week  2 weeks   Pertinent Vitals/Pain NA    SLP Swallow Goals Patient will utilize recommended strategies during swallow to  increase swallowing safety with: Maximal cueing;Total assistance   General HPI: 72 y.o.  female, with recent diagnosis of rectal  cancer with rectal bleeding under the care of Dr. Truett Perna and  Dr. Doylene Canard, who completed chemoradiation 4 days ago, CVA in the  past affecting left sided extremities for which she had  completely recovered, type 2 diabetes mellitus, dyslipidemia,  hypertension who was in her usual state of health till about 4  hours ago she experienced sudden onset of left-sided weakness,  facial droop and got limp, this was noticed by family members who  were around her, she was brought to the ER, code stroke was  called she was seen by neurology. Head CT did not show any acute  change, due to her rectal cancer and history of rectal bleeding  she was not a TPA candidate, after neurology saw the patient they  recommended hospitalist admission.  CXR  Mild enlargement of  cardiac silhouette. No acute abnormalities.  Failed RN swallow  screen  due to decreased level of alertness.  Daughter reports pt.  recently "choked on a pill and has had difficutly swallowing ever  since."  Type of Study: Modified Barium Swallowing Study Reason for Referral: Objectively evaluate swallowing function Diet Prior to this Study: NPO Temperature Spikes Noted: No Respiratory Status: Room air History of Recent Intubation: No Behavior/Cognition: Alert;Requires cueing;Decreased sustained  attention;Cooperative Oral Cavity - Dentition: Edentulous Oral Motor / Sensory Function: Impaired - see Bedside swallow  eval Self-Feeding Abilities: Needs assist Patient Positioning: Upright in chair Baseline Vocal Quality: Clear Volitional Cough: Weak Volitional Swallow: Able to elicit Anatomy: Within functional limits Pharyngeal Secretions: Not observed secondary MBS    Reason for Referral Objectively evaluate swallowing function   Oral Phase     Pharyngeal Phase    Cervical Esophageal Phase    GO             Harlon Ditty, MA CCC-SLP [phone]  Claudine Mouton 09/29/2012, 1:46 PM    Mr Maxine Glenn Head/brain Wo Cm  09/22/2012    *RADIOLOGY REPORT*  Clinical Data:  72 year old female with sudden onset left side weakness and facial droop.  Not at tPA candidate due to history of rectal cancer/rectal bleeding.  Recent diagnosis of rectal cancer. History of prior stroke affecting the left side.  Comparison: Head CT without contrast 09/21/2012.  Brain MRI/MRA 06/16/2008.  MRI HEAD WITHOUT CONTRAST  Technique: Multiplanar, multiecho pulse sequences of the brain and surrounding structures were obtained according to standard protocol without intravenous contrast.  Findings: Confluent restricted diffusion throughout much of the right MCA territory, affecting cortex and white matter.  Basal ganglia partially affected.  Overall moderate stroke size. Associated cytotoxic edema.  No midline shift or significant mass effect at this time.  No associated acute intracranial hemorrhage. Occasional small foci of restricted diffusion also in the right occipital lobe.  No posterior fossa involvement.  There could be a punctate area of restricted diffusion also in the left posterior MCA territory, left parietal lobe on series 4 image 18.  Major intracranial vascular flow voids are stable at the skull base, but the right MCA M1 segment may now be abnormal.  See MRA findings below.  Interval expected evolution of the right thalamic lacunar infarct which occurred in 2010.  Chronic right cerebellar infarct is stable.  No ventriculomegaly.  Negative pituitary, cervicomedullary junction and visualized cervical spine.  No extra-axial collection. No vasogenic cerebral edema is identified.  Bone marrow signal now is mostly abnormal in the skull and visible cervical spine.  Marrow signal is normal in 2010.  Visualized orbit soft tissues are within normal limits.  Mild mastoid effusions greater on the right.  Stable paranasal sinuses. Negative scalp soft tissues.  IMPRESSION: 1.  Moderate sized confluent right MCA infarct with cytotoxic edema. See MRA findings below.  No  associated hemorrhage or mass effect at this time. 2.  Possible superimposed acute to subacute lacunar infarct in the posterior left MCA territory. 3.  Otherwise, stable brain with expected evolution of the 2010 right thalamus lacunar infarct. 4.  Bone marrow signal has become abnormal since 2010.  This could be related to anemia due to GI bleeding or cancer therapy. Metastatic disease to bone is less likely. 5.  Mild mastoid effusions.  MRA HEAD WITHOUT CONTRAST  Technique: Angiographic images of the Circle of Willis were obtained using MRA technique without  intravenous contrast.  Findings: Artifact at the cervicomedullary junction.  Stable diminutive posterior circulation with antegrade flow in the distal  vertebral arteries.  Patent PICA origins.  Patent basilar artery without stenosis.  Fetal type bilateral PCA origins re- identified.  Chronic left PCA superior division origin stenosis. Chronic tandem stenoses in the right PCA distal P1 and P2 segment, not significantly changed.  Stable antegrade flow in both ICA siphons.  No ICA stenosis. Patent carotid termini.  Bilateral ACA and left MCA origins are patent.  Diminutive or absent anterior communicating artery.  Visualized bilateral ACA branches are within normal limits.  Stable visualized left MCA branches.  Right MCA occlusion just beyond the M1 segment origin.  No distal right MCA flow.  IMPRESSION: 1.  New right MCA occlusion just beyond its origin. 2.  Otherwise stable intracranial MRA including proximal right PCA tandem stenoses and distal left PCA stenosis.  Salient findings discussed with Dr. Leroy Kennedy by telephone at 2020 hours on 09/22/2012.   Original Report Authenticated By: Erskine Speed, M.D.    Microbiology: Recent Results (from the past 240 hour(s))  MRSA PCR SCREENING     Status: None   Collection Time    09/22/12  3:33 AM      Result Value Range Status   MRSA by PCR NEGATIVE  NEGATIVE Final   Comment:            The GeneXpert MRSA Assay  (FDA     approved for NASAL specimens     only), is one component of a     comprehensive MRSA colonization     surveillance program. It is not     intended to diagnose MRSA     infection nor to guide or     monitor treatment for     MRSA infections.     Labs: Basic Metabolic Panel:  Recent Labs Lab 09/26/12 0500 09/27/12 0600 09/27/12 1213 09/28/12 0505 09/29/12 0535 09/30/12 0725  NA 133* 135 135 136 137 138  K 3.1* 3.6 3.5 3.4* 3.7 3.2*  CL 96 99 99 99 101 102  CO2 26 26 27 26 24 27   GLUCOSE 256* 292* 295* 290* 168* 95  BUN 13 10 11 13 17 18   CREATININE 0.25* 0.24* 0.27* 0.24* 0.26* 0.30*  CALCIUM 8.1* 8.2* 8.2* 8.1* 8.3* 8.2*  MG  --  1.9  --   --  2.1  --    Liver Function Tests: No results found for this basename: AST, ALT, ALKPHOS, BILITOT, PROT, ALBUMIN,  in the last 168 hours No results found for this basename: LIPASE, AMYLASE,  in the last 168 hours No results found for this basename: AMMONIA,  in the last 168 hours CBC:  Recent Labs Lab 09/26/12 0500 09/27/12 0600 09/28/12 0505 09/30/12 0725  WBC 3.4* 3.1* 3.9* 5.2  HGB 9.4* 10.3* 9.1* 8.7*  HCT 29.8* 33.5* 28.9* 28.0*  MCV 83.0 82.7 82.6 83.1  PLT 135* PLATELET CLUMPS NOTED ON SMEAR, COUNT APPEARS DECREASED 160 194   Cardiac Enzymes:  Recent Labs Lab 09/27/12 1213 09/27/12 1815 09/27/12 2223  TROPONINI <0.30 <0.30 <0.30   BNP: BNP (last 3 results) No results found for this basename: PROBNP,  in the last 8760 hours CBG:  Recent Labs Lab 09/30/12 1622 09/30/12 2011 09/30/12 2350 10/01/12 0416 10/01/12 0807  GLUCAP 139* 130* 123* 123* 112*       Signed:  Kawana Hegel  [facility] 10/01/2012, 10:31 AM

## 2012-10-02 ENCOUNTER — Other Ambulatory Visit: Payer: Self-pay | Admitting: *Deleted

## 2012-10-02 ENCOUNTER — Telehealth: Payer: Self-pay | Admitting: *Deleted

## 2012-10-02 NOTE — Progress Notes (Signed)
Pt d/c via ambulance to [facility]. Pt's granddaughter left with all belongings in a separate car. VSS. Pt in no s/sx of distress.

## 2012-10-02 NOTE — Telephone Encounter (Signed)
Spoke with patient's daughter and let her know that Dr. Truett Perna wants her mother to be seen back in the clinic in 3 weeks.  She stated she would contact this RN if she has not received an appointment within th next week,.  POF sent to scheduling per request of Dr. Truett Perna.

## 2012-10-05 ENCOUNTER — Telehealth: Payer: Self-pay | Admitting: *Deleted

## 2012-10-05 ENCOUNTER — Telehealth (INDEPENDENT_AMBULATORY_CARE_PROVIDER_SITE_OTHER): Payer: Self-pay

## 2012-10-05 NOTE — Telephone Encounter (Signed)
Left message for daughter to call.  I want to let her know Dr Abbey Chatters requests that her mom follow up with Dr Maisie Fus for the rectal cancer.  I know the pt just got out of the hospital so she can schedule at her convenience.  There are some openings in September (new patient slots)

## 2012-10-05 NOTE — Telephone Encounter (Signed)
Message from pt's daughter asking what is pt's prognosis. 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 Philbert Riser states she has had a hard time getting anyone at the hospital to tell them a prognosis related to this diagnosis.

## 2012-10-05 NOTE — Telephone Encounter (Signed)
Returned phone call to patient's daughter, Sharmon Leyden.  [name] was given message ,per Dr. Truett Perna, that her mother needs to recover from CVA before further treatment for cancer can be decided. He will discuss her condition at next office visit once he assesses her progress since the CVA.   This RN reminded her to please call if she has any further questions for Dr. Truett Perna.   Informed her that Dr. Truett Perna would like to see her mother back in approximately 3 weeks if she is physically able to make appointment . (POF sent to scheduler on 10/02/12).  She verbalized understanding and was without further questions.

## 2012-10-06 ENCOUNTER — Telehealth: Payer: Self-pay | Admitting: Oncology

## 2012-10-06 NOTE — Telephone Encounter (Signed)
lmonvm for pt dtr Tammy re appt for 11/06/12. Schedule mailed.

## 2012-10-07 ENCOUNTER — Inpatient Hospital Stay (HOSPITAL_COMMUNITY)
Admission: EM | Admit: 2012-10-07 | Discharge: 2012-10-14 | DRG: 064 | Disposition: A | Discharge status: Hospice | Payer: Medicare Other | Attending: Internal Medicine | Admitting: Internal Medicine

## 2012-10-07 ENCOUNTER — Emergency Department (HOSPITAL_COMMUNITY): Payer: Medicare Other

## 2012-10-07 ENCOUNTER — Inpatient Hospital Stay (HOSPITAL_COMMUNITY): Payer: Medicare Other

## 2012-10-07 ENCOUNTER — Encounter (HOSPITAL_COMMUNITY): Payer: Self-pay | Admitting: Emergency Medicine

## 2012-10-07 DIAGNOSIS — F411 Generalized anxiety disorder: Secondary | ICD-10-CM

## 2012-10-07 DIAGNOSIS — Z79899 Other long term (current) drug therapy: Secondary | ICD-10-CM

## 2012-10-07 DIAGNOSIS — Z7982 Long term (current) use of aspirin: Secondary | ICD-10-CM

## 2012-10-07 DIAGNOSIS — K560 Paralytic ileus: Secondary | ICD-10-CM | POA: Diagnosis present

## 2012-10-07 DIAGNOSIS — R1310 Dysphagia, unspecified: Secondary | ICD-10-CM | POA: Diagnosis present

## 2012-10-07 DIAGNOSIS — E785 Hyperlipidemia, unspecified: Secondary | ICD-10-CM | POA: Diagnosis present

## 2012-10-07 DIAGNOSIS — C20 Malignant neoplasm of rectum: Secondary | ICD-10-CM

## 2012-10-07 DIAGNOSIS — K5900 Constipation, unspecified: Secondary | ICD-10-CM | POA: Diagnosis present

## 2012-10-07 DIAGNOSIS — R208 Other disturbances of skin sensation: Secondary | ICD-10-CM

## 2012-10-07 DIAGNOSIS — R5381 Other malaise: Secondary | ICD-10-CM

## 2012-10-07 DIAGNOSIS — G8929 Other chronic pain: Secondary | ICD-10-CM | POA: Diagnosis present

## 2012-10-07 DIAGNOSIS — Z931 Gastrostomy status: Secondary | ICD-10-CM

## 2012-10-07 DIAGNOSIS — C801 Malignant (primary) neoplasm, unspecified: Secondary | ICD-10-CM

## 2012-10-07 DIAGNOSIS — R29810 Facial weakness: Secondary | ICD-10-CM | POA: Diagnosis present

## 2012-10-07 DIAGNOSIS — R000 Tachycardia, unspecified: Secondary | ICD-10-CM | POA: Diagnosis present

## 2012-10-07 DIAGNOSIS — I639 Cerebral infarction, unspecified: Secondary | ICD-10-CM

## 2012-10-07 DIAGNOSIS — I6350 Cerebral infarction due to unspecified occlusion or stenosis of unspecified cerebral artery: Principal | ICD-10-CM | POA: Diagnosis present

## 2012-10-07 DIAGNOSIS — Z66 Do not resuscitate: Secondary | ICD-10-CM | POA: Diagnosis present

## 2012-10-07 DIAGNOSIS — Z515 Encounter for palliative care: Secondary | ICD-10-CM

## 2012-10-07 DIAGNOSIS — Z8673 Personal history of transient ischemic attack (TIA), and cerebral infarction without residual deficits: Secondary | ICD-10-CM

## 2012-10-07 DIAGNOSIS — I10 Essential (primary) hypertension: Secondary | ICD-10-CM

## 2012-10-07 DIAGNOSIS — G936 Cerebral edema: Secondary | ICD-10-CM | POA: Diagnosis present

## 2012-10-07 DIAGNOSIS — M549 Dorsalgia, unspecified: Secondary | ICD-10-CM

## 2012-10-07 DIAGNOSIS — G9340 Encephalopathy, unspecified: Secondary | ICD-10-CM

## 2012-10-07 DIAGNOSIS — E119 Type 2 diabetes mellitus without complications: Secondary | ICD-10-CM

## 2012-10-07 DIAGNOSIS — I82409 Acute embolism and thrombosis of unspecified deep veins of unspecified lower extremity: Secondary | ICD-10-CM | POA: Diagnosis present

## 2012-10-07 DIAGNOSIS — J690 Pneumonitis due to inhalation of food and vomit: Secondary | ICD-10-CM

## 2012-10-07 DIAGNOSIS — Z9221 Personal history of antineoplastic chemotherapy: Secondary | ICD-10-CM

## 2012-10-07 DIAGNOSIS — C775 Secondary and unspecified malignant neoplasm of intrapelvic lymph nodes: Secondary | ICD-10-CM

## 2012-10-07 DIAGNOSIS — E43 Unspecified severe protein-calorie malnutrition: Secondary | ICD-10-CM

## 2012-10-07 LAB — CBC
HCT: 28 % — ABNORMAL LOW (ref 36.0–46.0)
Hemoglobin: 8.4 g/dL — ABNORMAL LOW (ref 12.0–15.0)
MCH: 25.5 pg — ABNORMAL LOW (ref 26.0–34.0)
MCHC: 30 g/dL (ref 30.0–36.0)
MCV: 85.1 fL (ref 78.0–100.0)
Platelets: 254 10*3/uL (ref 150–400)
RBC: 3.29 MIL/uL — ABNORMAL LOW (ref 3.87–5.11)
RDW: 25.6 % — ABNORMAL HIGH (ref 11.5–15.5)
WBC: 7.3 10*3/uL (ref 4.0–10.5)

## 2012-10-07 LAB — URINALYSIS, ROUTINE W REFLEX MICROSCOPIC
Glucose, UA: 250 mg/dL — AB
Hgb urine dipstick: NEGATIVE
Ketones, ur: 15 mg/dL — AB
Nitrite: NEGATIVE
Protein, ur: 30 mg/dL — AB
Specific Gravity, Urine: 1.037 — ABNORMAL HIGH (ref 1.005–1.030)
Urobilinogen, UA: 1 mg/dL (ref 0.0–1.0)
pH: 5.5 (ref 5.0–8.0)

## 2012-10-07 LAB — COMPREHENSIVE METABOLIC PANEL
ALT: 13 U/L (ref 0–35)
AST: 33 U/L (ref 0–37)
Albumin: 1.8 g/dL — ABNORMAL LOW (ref 3.5–5.2)
Alkaline Phosphatase: 547 U/L — ABNORMAL HIGH (ref 39–117)
BUN: 19 mg/dL (ref 6–23)
CO2: 30 mEq/L (ref 19–32)
Calcium: 8.5 mg/dL (ref 8.4–10.5)
Chloride: 97 mEq/L (ref 96–112)
Creatinine, Ser: 0.21 mg/dL — ABNORMAL LOW (ref 0.50–1.10)
GFR calc Af Amer: >90 mL/min (ref >90)
GFR calc non Af Amer: >90 mL/min (ref >90)
Glucose, Bld: 281 mg/dL — ABNORMAL HIGH (ref 70–99)
Potassium: 3.9 mEq/L (ref 3.5–5.1)
Sodium: 137 mEq/L (ref 135–145)
Total Bilirubin: 0.4 mg/dL (ref 0.3–1.2)
Total Protein: 5.9 g/dL — ABNORMAL LOW (ref 6.0–8.3)

## 2012-10-07 LAB — URINE MICROSCOPIC-ADD ON

## 2012-10-07 LAB — GLUCOSE, CAPILLARY
Glucose-Capillary: 227 mg/dL — ABNORMAL HIGH (ref 70–99)
Glucose-Capillary: 216 mg/dL — ABNORMAL HIGH (ref 70–99)

## 2012-10-07 MED ORDER — CHLORHEXIDINE GLUCONATE 0.12 % MT SOLN
15.0000 mL | Freq: Two times a day (BID) | OROMUCOSAL | Status: DC
  Administered 2012-10-07 – 2012-10-14 (×10): 15 mL via OROMUCOSAL
  Filled 2012-10-07 (×16): qty 15

## 2012-10-07 MED ORDER — METOPROLOL TARTRATE 25 MG PO TABS
25.0000 mg | ORAL_TABLET | Freq: Two times a day (BID) | ORAL | Status: DC
  Administered 2012-10-07 – 2012-10-09 (×4): 25 mg
  Filled 2012-10-07 (×5): qty 1

## 2012-10-07 MED ORDER — ALPRAZOLAM 0.25 MG PO TABS
0.2500 mg | ORAL_TABLET | Freq: Three times a day (TID) | ORAL | Status: DC | PRN
  Administered 2012-10-07: 0.25 mg via ORAL
  Filled 2012-10-07: qty 1

## 2012-10-07 MED ORDER — SIMVASTATIN 20 MG PO TABS
20.0000 mg | ORAL_TABLET | Freq: Every day | ORAL | Status: DC
  Administered 2012-10-07 – 2012-10-08 (×2): 20 mg via ORAL
  Filled 2012-10-07 (×3): qty 1

## 2012-10-07 MED ORDER — OXYCODONE-ACETAMINOPHEN 5-325 MG PO TABS
1.0000 | ORAL_TABLET | Freq: Once | ORAL | Status: AC
  Administered 2012-10-07: 1 via ORAL
  Filled 2012-10-07: qty 1

## 2012-10-07 MED ORDER — VANCOMYCIN HCL 500 MG IV SOLR
500.0000 mg | Freq: Two times a day (BID) | INTRAVENOUS | Status: DC
  Administered 2012-10-08 – 2012-10-09 (×3): 500 mg via INTRAVENOUS
  Filled 2012-10-07 (×5): qty 500

## 2012-10-07 MED ORDER — ASPIRIN 325 MG PO TABS
325.0000 mg | ORAL_TABLET | Freq: Every day | ORAL | Status: DC
  Administered 2012-10-08: 325 mg
  Filled 2012-10-07: qty 1

## 2012-10-07 MED ORDER — FENTANYL CITRATE 0.05 MG/ML IJ SOLN
25.0000 ug | Freq: Once | INTRAMUSCULAR | Status: AC
  Administered 2012-10-07: 25 ug via INTRAVENOUS
  Filled 2012-10-07: qty 2

## 2012-10-07 MED ORDER — FREE WATER
100.0000 mL | Freq: Four times a day (QID) | Status: DC
  Administered 2012-10-07 – 2012-10-09 (×8): 100 mL

## 2012-10-07 MED ORDER — PIPERACILLIN-TAZOBACTAM 3.375 G IVPB 30 MIN
3.3750 g | Freq: Three times a day (TID) | INTRAVENOUS | Status: DC
Start: 2012-10-07 — End: 2012-10-07

## 2012-10-07 MED ORDER — INSULIN ASPART 100 UNIT/ML ~~LOC~~ SOLN
0.0000 [IU] | SUBCUTANEOUS | Status: DC
  Administered 2012-10-07: 3 [IU] via SUBCUTANEOUS
  Administered 2012-10-07 – 2012-10-08 (×2): 2 [IU] via SUBCUTANEOUS
  Administered 2012-10-08: 3 [IU] via SUBCUTANEOUS
  Administered 2012-10-08: 2 [IU] via SUBCUTANEOUS
  Administered 2012-10-08: 3 [IU] via SUBCUTANEOUS
  Administered 2012-10-08 – 2012-10-09 (×2): 2 [IU] via SUBCUTANEOUS
  Administered 2012-10-09: 1 [IU] via SUBCUTANEOUS

## 2012-10-07 MED ORDER — VANCOMYCIN HCL 500 MG IV SOLR
500.0000 mg | INTRAVENOUS | Status: AC
  Administered 2012-10-07: 500 mg via INTRAVENOUS
  Filled 2012-10-07: qty 500

## 2012-10-07 MED ORDER — POLYETHYLENE GLYCOL 3350 17 G PO PACK
17.0000 g | PACK | Freq: Every day | ORAL | Status: DC | PRN
  Administered 2012-10-07: 17 g via ORAL
  Filled 2012-10-07: qty 1

## 2012-10-07 MED ORDER — ONDANSETRON HCL 4 MG PO TABS: 4.0000 mg | ORAL_TABLET | Freq: Four times a day (QID) | ORAL | Status: DC | PRN

## 2012-10-07 MED ORDER — PIPERACILLIN-TAZOBACTAM 3.375 G IVPB 30 MIN
3.3750 g | INTRAVENOUS | Status: AC
  Administered 2012-10-08: 3.375 g via INTRAVENOUS

## 2012-10-07 MED ORDER — FENTANYL 25 MCG/HR TD PT72
25.0000 ug | MEDICATED_PATCH | TRANSDERMAL | Status: DC
  Administered 2012-10-08: 25 ug via TRANSDERMAL
  Filled 2012-10-07: qty 1

## 2012-10-07 MED ORDER — PIPERACILLIN-TAZOBACTAM 3.375 G IVPB
3.3750 g | Freq: Three times a day (TID) | INTRAVENOUS | Status: DC
  Administered 2012-10-08 – 2012-10-09 (×5): 3.375 g via INTRAVENOUS
  Filled 2012-10-07 (×8): qty 50

## 2012-10-07 MED ORDER — SODIUM CHLORIDE 0.9 % IV BOLUS (SEPSIS)
1000.0000 mL | INTRAVENOUS | Status: AC
  Administered 2012-10-07: 1000 mL via INTRAVENOUS

## 2012-10-07 MED ORDER — GLUCERNA 1.2 CAL PO LIQD
1000.0000 mL | ORAL | Status: DC
  Administered 2012-10-07: 1000 mL
  Filled 2012-10-07 (×6): qty 1000

## 2012-10-07 MED ORDER — ACETAMINOPHEN 650 MG RE SUPP: 650.0000 mg | Freq: Four times a day (QID) | RECTAL | Status: DC | PRN

## 2012-10-07 MED ORDER — SENNA 8.6 MG PO TABS
1.0000 | ORAL_TABLET | Freq: Two times a day (BID) | ORAL | Status: DC
  Administered 2012-10-07 – 2012-10-09 (×4): 8.6 mg via ORAL
  Filled 2012-10-07 (×4): qty 1

## 2012-10-07 MED ORDER — ACETAMINOPHEN 325 MG PO TABS: 650.0000 mg | ORAL_TABLET | Freq: Once | ORAL | Status: DC

## 2012-10-07 MED ORDER — FERROUS SULFATE 325 (65 FE) MG PO TABS
325.0000 mg | ORAL_TABLET | Freq: Two times a day (BID) | ORAL | Status: DC
  Administered 2012-10-07 – 2012-10-09 (×4): 325 mg via ORAL
  Filled 2012-10-07 (×5): qty 1

## 2012-10-07 MED ORDER — ENOXAPARIN SODIUM 40 MG/0.4ML ~~LOC~~ SOLN
40.0000 mg | SUBCUTANEOUS | Status: DC
  Administered 2012-10-07 – 2012-10-10 (×3): 40 mg via SUBCUTANEOUS
  Filled 2012-10-07 (×5): qty 0.4

## 2012-10-07 MED ORDER — ONDANSETRON HCL 4 MG/2ML IJ SOLN: 4.0000 mg | Freq: Four times a day (QID) | INTRAMUSCULAR | Status: DC | PRN

## 2012-10-07 MED ORDER — ACETAMINOPHEN 325 MG RE SUPP: 325.0000 mg | Freq: Once | RECTAL | Status: DC

## 2012-10-07 MED ORDER — ACETAMINOPHEN 325 MG PO TABS: 650.0000 mg | ORAL_TABLET | Freq: Four times a day (QID) | ORAL | Status: DC | PRN

## 2012-10-07 MED ORDER — ACETAMINOPHEN 160 MG/5ML PO SOLN
500.0000 mg | Freq: Four times a day (QID) | ORAL | Status: DC | PRN
  Administered 2012-10-07 – 2012-10-08 (×2): 500 mg via ORAL
  Filled 2012-10-07 (×2): qty 20.3
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
## 2012-10-07 NOTE — Progress Notes (Signed)
Clinical Social Work Department BRIEF PSYCHOSOCIAL ASSESSMENT 10/07/2012  Patient:  JERUSALEM, BROWNSTEIN     Account Number:  [id number]     Admit date:  10/07/2012  Clinical Social Worker:  Illene Silver  Date/Time:  10/07/2012 09:04 AM  Referred by:  CSW  Date Referred:  10/07/2012 Referred for  SNF Placement   Other Referral:   Interview type:  Family Other interview type:    PSYCHOSOCIAL DATA Living Status:  FACILITY Admitted from facility:  [facility] Level of care:  Skilled Nursing Facility Primary support name:  lolita Primary support relationship to patient:  CHILD, ADULT Degree of support available:   Good, but family unable to care for pt at home. Pt placed at [facility] last week and family doesn't want pt to return there, citing poor care.    CURRENT CONCERNS Current Concerns  Post-Acute Placement   Other Concerns:    SOCIAL WORK ASSESSMENT / PLAN CSW familar with pt from last admission. Pt placed at [facility] and family very unhappy with her care there. Family would like pt to be placed at either [facility] or [facility] with [facility] being their first choice.  Unit CSW to follow for disposition/NHP.   Assessment/plan status:  Psychosocial Support/Ongoing Assessment of Needs Other assessment/ plan:   Information/referral to community resources:    PATIENT'S/FAMILY'S RESPONSE TO PLAN OF CARE: Family upset with [facility] experience.  Emotional support offered.

## 2012-10-07 NOTE — ED Notes (Signed)
Pt to ED from [facility] home per family request for evaluation of altered mental status and increased shortness of breath since yesterday.  Pt easily arawsoble.  Recently received radiation treatment of rectal cancer.  93% on RA, pt has Fentanyl patch in place.  Iv in place, pt on cardiac monitor.

## 2012-10-07 NOTE — ED Provider Notes (Addendum)
Medical screening examination/treatment/procedure(s) were conducted as a shared visit with non-physician practitioner(s) and myself.  I personally evaluated the patient during the encounter.  Elderly female who presents with AMS.  Patient nonfocal on exam.  Abdomen distended.  Head CT with evolution of infarct.  NEurology recommends starting plavix.  Patient also found to have an ileus. Otherwise, infectious w/u negative.  Patient will be admitted.   Date: 10/07/2012 EKG:  Normal sinus rhythm, quadrigeminy noted   Shon Baton, MD 10/07/12 1610  Shon Baton, MD 10/07/12 2131

## 2012-10-07 NOTE — Progress Notes (Signed)
ANTIBIOTIC CONSULT NOTE - INITIAL  Pharmacy Consult for Vancomycin, Zosyn Indication: pneumonia  Allergies  Allergen Reactions  . Ace Inhibitors   . Fish Oil     REACTION: GI  . Glipizide     REACTION: hypoglycemia  . Ibuprofen   . Rosiglitazone Maleate     REACTION: edema  . Sulfonamide Derivatives     Patient Measurements: Weight = 68 kg  Labs:  Recent Labs  10/07/12 1042  WBC 7.3  HGB 8.4*  PLT 254  CREATININE 0.21*   The CrCl is unknown because both a height and weight (above a minimum accepted value) are required for this calculation. No results found for this basename: VANCOTROUGH, Leodis Binet, VANCORANDOM, GENTTROUGH, GENTPEAK, GENTRANDOM, TOBRATROUGH, TOBRAPEAK, TOBRARND, AMIKACINPEAK, AMIKACINTROU, AMIKACIN,  in the last 72 hours   Microbiology: Recent Results (from the past 720 hour(s))  MRSA PCR SCREENING     Status: None   Collection Time    09/22/12  3:33 AM      Result Value Range Status   MRSA by PCR NEGATIVE  NEGATIVE Final   Comment:            The GeneXpert MRSA Assay (FDA     approved for NASAL specimens     only), is one component of a     comprehensive MRSA colonization     surveillance program. It is not     intended to diagnose MRSA     infection nor to guide or     monitor treatment for     MRSA infections.    Medical History: Past Medical History  Diagnosis Date  . Hypertension   . Diabetes mellitus without complication   . Hyperlipidemia   . Back pain   . Insomnia   . Hyperlipidemia   . Stroke 2009  . Hemorrhoids   . History of recent fall 07/05/12  . Cancer 07/21/12    rectal, lymphadenopathy    Assessment: 72 year old female admitted from [facility] home with altered mental status and increased shortness of breath.  Beginning empiric vancomycin and Zosyn for pneumonia.  Goal of Therapy:  Vancomycin trough level 15-20 mcg/ml Appropriate Zosyn dosing  Plan:  1) Zosyn 3.375 grams iv Q 8 hours - 4 hr infusion 2)  Vancomycin 500 mg iv Q 12 hours 3) Follow up cultures, Scr, progress  Thank you. Okey Regal, PharmD [phone]  10/07/2012,3:11 PM

## 2012-10-07 NOTE — H&P (Signed)
[facility] History and Physical  Rachel Benton ZOX:[id number] DOB: Nov 23, 1940 DOA: 10/07/2012   PCP: Rachel Showers, MD   Chief Complaint: Agitation, encephalopathy  HPI:  72 y.o. 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 The patient was discharged to [facility] with TF via G-tube. The patient was found to be confused and agitated last night and this morning. Apparently the patient was also noted to be somewhat tachycardic. As a result, the patient was transferred to the ED for further evaluation.  The patient is currently encephalopathic. She is unable to provide any history. All of this history is obtained from the patient's daughter at the bedside. The patient had an episode of emesis approximately 2-3 days ago. In addition, the patient has not had a bowel movement for approximately 5 days. The patient's family notes that she has been complaining of persistent right shoulder and right back pain although they cannot clarify for me exactly where in the back it was bothering her.  In the emergency department, the patient was noted to have a fever 101.56F with tachycardia with heart rate of 105. She was hemodynamically stable. She had oxygen saturation 93-94% on room air. The patient was given fentanyl and as a result she is somnolent at the time of my examination. The patient's family relates that the patient has been agitated because of right shoulder and back pain which she was able to verbalize last night. The family also states that they have not been giving her anything by mouth.  Assessment/Plan: Acute encephalopathy -Multifactorial  including aspiration pneumonitis, opioids, and the possibility of new stroke. -Repeat MRI brain -CT brain in the ED suggested acute/subacute right inferior cerebellar infarct with evolution of her right MCA -Neurology has been consulted by the emergency department Fever -Likely due to aspiration pneumonitis -I have requested Blood cultures x 2 in ED -Start empiric vancomycin and Zosyn pending culture data -Urinalysis did not show significant pyuria Dysphagia -Resulting from her stroke at her last admission -Patient required gastrostomy tube -Continue gastrostomy tube feedings and monitor closely -The patient will continue to be at risk for aspiration despite gastrostomy tube feedings. This was discussed with the patient's family although the family states that the patient's [facility] was not elevated at SNF Right MCA infarct with cytotoxic cerebral edema and acute to subacute lacunar infarct in the posterior left MCA territory  -patient's mentation waxes and wanes throughout the day with periods of alertness and somnolence during last admission -felt to be thromboembolic secondary to occluded right M1 noted on MRA - no signif stenosis on carotids  DIABETES MELLITUS, TYPE II  -hyperglycemia has been exacerbated by the patient's tube feedings  -Hemoglobin A1c 8.1 on 09/21/2012 -Start NovoLog sliding scale and 4 units NovoLog every 4 hours  HYPERLIPIDEMIA  On zocor  ANXIETY, MILD  -currently, patient denies any feelings of anxiety or jitteriness  On chronic benzo as outpt - clarified with family today that patient only uses "every now and then" - stopped scheduled dosing and follow clinically with prn only  HYPERTENSION  -continue metoprolol tartrate 25 mg twice a day  Tachycardia/ocasional bursts of ST w/ frequent PAC's vs PAF  -likely due to infectious process, but  had similar problem at last admission  -appears to be improving since restarting metoprolol tartrate at time of last  admission -previous EKG showed sinus with PACs  -place back on telemetry-->if Afib, may need to altered antiplatelet tx  -check EKG now BACK PAIN, CHRONIC  -xray cervical, thoracic, lumbar spine--?mets 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 -Patient had retained contrast in her abdominal x-ray, which was on 1 view -We'll obtain 2 view x-ray        Past Medical History  Diagnosis Date  . Hypertension   . Diabetes mellitus without complication   . Hyperlipidemia   . Back pain   . Insomnia   . Hyperlipidemia   . Stroke 2009  . Hemorrhoids   . History of recent fall 07/05/12  . Cancer 07/21/12    rectal, lymphadenopathy   Past Surgical History  Procedure Laterality Date  . Hip arthroplasty Left 2009    fall injury  . Abdominal hysterectomy      TAH, > 37 yrs ago  . Bladder suspension      many yrs ago   Social History:  reports that she has never smoked. She has never used smokeless tobacco. She reports that she does not drink alcohol or use illicit drugs.   Family History  Problem Relation Age of Onset  . Cancer Sister     lung  . Cancer Daughter     cervical  . Diabetes Mother      Allergies  Allergen Reactions  . Ace Inhibitors   . Fish Oil     REACTION: GI  . Glipizide     REACTION: hypoglycemia  . Ibuprofen   . Rosiglitazone Maleate     REACTION: edema  . Sulfonamide Derivatives       Prior to Admission medications   Medication Sig Start Date End Date Taking? Authorizing Provider  acetaminophen (TYLENOL) 650 MG CR tablet Take 650 mg by mouth every 4 (four) hours.   Yes Historical Provider, MD  Aloe Vera LIQD Take 60 mLs by mouth 3 (three) times daily. Mixed in juice   Yes Historical Provider, MD  ALPRAZolam (XANAX) 0.25 MG tablet Take 0.25 mg by mouth 3 (three) times  daily as needed for anxiety.   Yes Historical Provider, MD  amoxicillin-clavulanate (AUGMENTIN) 875-125 MG per tablet Take 1 tablet by mouth 2 (two) times daily. 10/01/12  Yes Rachel Dhungel, MD  aspirin 325 MG tablet Place 1 tablet (325 mg total) into feeding tube daily. 10/01/12  Yes Rachel Dhungel, MD  chlorhexidine (PERIDEX) 0.12 % solution Use as directed 15 mL in the mouth or throat 2 (two) times daily. 10/01/12  Yes Rachel Dhungel, MD  emollient (BIAFINE) cream Apply 1 application topically See admin instructions. To bottom after every bathroom trip   Yes Historical Provider, MD  ferrous sulfate 325 (65 FE) MG tablet Take 325 mg by mouth 2 (two) times daily.    Yes Historical Provider, MD  metFORMIN (GLUCOPHAGE) 1000 MG tablet Take 1,000 mg by mouth 2 (two) times daily with a meal.   Yes Historical Provider, MD  metoprolol tartrate (LOPRESSOR) 25 MG tablet Place 1 tablet (25 mg total) into feeding tube 2 (two) times daily. 10/01/12  Yes Rachel Dhungel, MD  Nutritional Supplements (FEEDING SUPPLEMENT, GLUCERNA 1.2 CAL,) LIQD Place 1,000 mL into feeding tube continuous. 10/01/12  Yes Eddie North, MD  OVER THE COUNTER MEDICATION Radiology cream to radiation burns on her mid section three times daily   Yes Historical Provider, MD  polyethylene glycol (MIRALAX / GLYCOLAX) packet Take 17 g by mouth daily as needed (constipation).    Yes Historical Provider, MD  pravastatin (PRAVACHOL) 40 MG tablet Take 40 mg by mouth daily.   Yes Historical Provider, MD  prochlorperazine (COMPAZINE) 10 MG tablet Take 10 mg by mouth every 6 (six) hours as needed (nausea).   Yes Historical Provider, MD  traMADol-acetaminophen (ULTRACET) 37.5-325 MG per tablet Take 1 tablet by mouth every 6 (six) hours as needed for pain. 10/01/12  Yes Rachel Dhungel, MD  fentaNYL (DURAGESIC - DOSED MCG/HR) 25 MCG/HR patch Place 1 patch onto the skin every 3 (three) days.    Historical Provider, MD  ondansetron (ZOFRAN) 4 MG tablet  Take 4 mg by mouth every 8 (eight) hours as needed for nausea.    Historical Provider, MD    Review of Systems:  Unobtainable secondary to patient's mental status  Physical Exam: Filed Vitals:   10/07/12 1031 10/07/12 1100 10/07/12 1130 10/07/12 1357  BP:  139/80 113/72 111/59  Pulse:  98 93 105  Temp: 101 F (38.3 C)   98.8 F (37.1 C)  TempSrc: Rectal   Rectal  Resp:  24 17 18   SpO2:  95% 93% 94%   General: , NAD, nontoxic, awakens to voice, but somnolent. Does not follow commands. Head/Eye: No conjunctival hemorrhage, no icterus, [place]/AT, No nystagmus ENT:  No icterus,  No thrush, good dentition, Neck:  No masses, no lymphadenpathy,  CV:  RRR, no rub, no gallop, no S3 Lung:  diminished breath sounds left base. Right clear to auscultation. No wheezing. Abdomen: soft/NT, +BS, nondistended, no peritoneal signs; gastrostomy tube site without erythema or induration  Ext: No cyanosis, No rashes, No petechiae, No lymphangitis, 1+LLE edema Neuro: right facial droop, patient withdraws extremities to protopathic stimuli. Corneal reflex intact   Labs on Admission:  Basic Metabolic Panel:  Recent Labs Lab 10/07/12 1042  NA 137  K 3.9  CL 97  CO2 30  GLUCOSE 281*  BUN 19  CREATININE 0.21*  CALCIUM 8.5   Liver Function Tests:  Recent Labs Lab 10/07/12 1042  AST 33  ALT 13  ALKPHOS 547*  BILITOT 0.4  PROT 5.9*  ALBUMIN 1.8*   No results found for this basename: LIPASE, AMYLASE,  in the last 168 hours No results found for this basename: AMMONIA,  in the last 168 hours CBC:  Recent Labs Lab 10/07/12 1042  WBC 7.3  HGB 8.4*  HCT 28.0*  MCV 85.1  PLT 254   Cardiac Enzymes: No results found for this basename: CKTOTAL, CKMB, CKMBINDEX, TROPONINI,  in the last 168 hours BNP: No components found with this basename: POCBNP,  CBG:  Recent Labs Lab 10/01/12 0807 10/01/12 1203 10/01/12 1544 10/01/12 2006 10/07/12 1123  GLUCAP 112* 124* 161* 152* 242*     Radiological Exams on Admission: Dg Abd 1 View  10/07/2012   *RADIOLOGY REPORT*  Clinical Data: Locally advanced rectal cancer.  Dysphagia. Altered mental status.  ABDOMEN - 1 VIEW  Comparison: Fluoroscopic spot films from a gastrostomy tube insertion 09/30/2012.  Findings: PEG tube in apparent good position. Moderate amount of contrast remains inspissated in the colon.  Gas pattern nonobstructive. Incompletely evaluated left hip fracture intramedullary rod and screw placement.  IMPRESSION: Satisfactory appearing PEG tube. Residual contrast in the colon suggests adynamic  ileus.   Original Report Authenticated By: Davonna Belling, M.D.   Ct Head Wo Contrast  10/07/2012   CLINICAL DATA:  72 year old female with decreased level of consciousness. Recent right brain infarct.  EXAM: CT HEAD WITHOUT CONTRAST  TECHNIQUE: Contiguous axial images were obtained from the base of the skull through the vertex without intravenous contrast.  COMPARISON:  Brain MRI 09/22/2012 and earlier.  FINDINGS: Visualized paranasal sinuses and mastoids are clear. No acute osseous abnormality identified. Visualized orbits and scalp soft tissues are within normal limits.  Interval expected evolution of hypodensity in the right MCA territory. No associated mass effect. Chronic lacunar infarcts in the right thalamus and right cerebellum. However, there is a new focus of hypodensity in the inferior right cerebellum similar in size to the chronic lesion (series 2, image 6). No associated mass effect or hemorrhage. Elsewhere stable gray-white matter differentiation in the posterior fossa.  Ventricular size and configuration are within normal limits. No suspicious intracranial vascular hyperdensity.  IMPRESSION: 1. New acute/subacute right inferior cerebellar infarct, located near the chronic infarct seen previously. No mass effect or hemorrhage.  2. Expected evolution of the recent right MCA infarct.   Electronically Signed   By: Augusto Gamble    On: 10/07/2012 13:46   Dg Chest Portable 1 View  10/07/2012   *RADIOLOGY REPORT*  Clinical Data: Fever, altered mental status, history hypertension, diabetes, stroke  PORTABLE CHEST - 1 VIEW  Comparison: Portable exam 1058 hours compared to 09/27/2012  Findings: Enlargement of cardiac silhouette. Tortuous aorta. Pulmonary vascularity normal. Persistent left basilar infiltrate. Mild atelectasis at right base. Accentuated perihilar markings with peribronchial thickening. No gross pleural effusion or pneumothorax.  IMPRESSION: Persistent left lower lobe infiltrate. Right basilar atelectasis. Bronchitic changes with accentuated perihilar markings, little changed.   Original Report Authenticated By: Ulyses Southward, M.D.    EKG: Independently reviewed. pending    Time spent:70 minutes Code Status:   DNR Family Communication:   Family at bedside   Aadhav Uhlig, DO  [facility] Pager [phone]  If 7PM-7AM, please contact night-coverage [email or web address] Password [email or web address] 10/07/2012, 3:00 PM

## 2012-10-08 ENCOUNTER — Inpatient Hospital Stay (HOSPITAL_COMMUNITY): Payer: Medicare Other

## 2012-10-08 DIAGNOSIS — I639 Cerebral infarction, unspecified: Secondary | ICD-10-CM | POA: Diagnosis present

## 2012-10-08 DIAGNOSIS — R609 Edema, unspecified: Secondary | ICD-10-CM

## 2012-10-08 DIAGNOSIS — E43 Unspecified severe protein-calorie malnutrition: Secondary | ICD-10-CM

## 2012-10-08 DIAGNOSIS — F411 Generalized anxiety disorder: Secondary | ICD-10-CM

## 2012-10-08 LAB — GLUCOSE, CAPILLARY: Glucose-Capillary: 170 mg/dL — ABNORMAL HIGH (ref 70–99)

## 2012-10-08 MED ORDER — INSULIN ASPART 100 UNIT/ML ~~LOC~~ SOLN
5.0000 [IU] | SUBCUTANEOUS | Status: DC
  Administered 2012-10-08 – 2012-10-09 (×5): 5 [IU] via SUBCUTANEOUS

## 2012-10-08 MED ORDER — MORPHINE SULFATE 2 MG/ML IJ SOLN
1.0000 mg | INTRAMUSCULAR | Status: DC | PRN
  Administered 2012-10-08 – 2012-10-09 (×2): 1 mg via INTRAVENOUS
  Filled 2012-10-08 (×2): qty 1

## 2012-10-08 MED ORDER — DIPHENHYDRAMINE HCL 50 MG/ML IJ SOLN
12.5000 mg | Freq: Four times a day (QID) | INTRAMUSCULAR | Status: DC | PRN
  Administered 2012-10-08: 12.5 mg via INTRAVENOUS
  Filled 2012-10-08: qty 1

## 2012-10-08 MED ORDER — MORPHINE SULFATE 2 MG/ML IJ SOLN
1.0000 mg | INTRAMUSCULAR | Status: DC | PRN
  Administered 2012-10-08 (×3): 1 mg via INTRAVENOUS
  Filled 2012-10-08 (×3): qty 1

## 2012-10-08 MED ORDER — CLOPIDOGREL BISULFATE 75 MG PO TABS
75.0000 mg | ORAL_TABLET | Freq: Every day | ORAL | Status: DC
  Administered 2012-10-09: 75 mg via ORAL
  Filled 2012-10-08 (×2): qty 1

## 2012-10-08 NOTE — Progress Notes (Signed)
INITIAL NUTRITION ASSESSMENT  DOCUMENTATION CODES Per approved criteria  -Obesity Unspecified   INTERVENTION: 1. Recommend continue Glucerna 1.2 @ 55 and 100 ml free water q 6 hrs- this is meeting nutrition neds  NUTRITION DIAGNOSIS: Inadequate oral intake related to inability to eat as evidenced by NPO with enteral nutrition.   Goal: Enteral nutrition to meet >/=90% estimated nutrition needs. Met  Monitor:  Enteral nutrition regimen, weight trends, labs  Reason for Assessment: New TF   72 y.o. female  Admitting Dx: Agitation, encephalopathy   ASSESSMENT: Pt admitted 2/2 AMS at SNF. Pt with recent hospitalization for CVA resulting in placement of PEG for nutrition needs and being completely NPO.   TF regimen from SNF has been continued at this admission. Glucerna 1.2 @ 55 ml/hr and 100 ml free water q 6 hrs. This is providing 1584 kcal, 79 gm protein, and 1663 ml free water daily.   Family denied any known issues with TF recently.   Height: Ht Readings from Last 1 Encounters:  09/22/12 4\' 9"  (1.448 m)    Weight: Wt Readings from Last 1 Encounters:  10/01/12 149 lb 9 oz (67.841 kg)    Ideal Body Weight: 43.1 kg   % Ideal Body Weight: 157%  Wt Readings from Last 10 Encounters:  10/01/12 149 lb 9 oz (67.841 kg)  09/15/12 139 lb 9.6 oz (63.322 kg)  09/08/12 141 lb 6.4 oz (64.139 kg)  09/08/12 141 lb 6.4 oz (64.139 kg)  09/01/12 140 lb 4.8 oz (63.64 kg)  08/25/12 145 lb 11.2 oz (66.089 kg)  08/20/12 147 lb (66.679 kg)  08/17/12 145 lb 3.2 oz (65.862 kg)  08/13/12 145 lb 6.4 oz (65.953 kg)  07/28/12 149 lb 6.4 oz (67.767 kg)    Usual Body Weight: 145-149 lbs   % Usual Body Weight: 103%  BMI:  32 kg/(m^2) Obesity class 1   Estimated Nutritional Needs: Kcal: 1400-1600  Protein: 65-75 gm  Fluid: >/= 1.5 L  Skin: no new wounds, previous red area on sacrum per doc flow-sheets   Diet Order: NPO  EDUCATION NEEDS: -No education needs identified at this  time   Intake/Output Summary (Last 24 hours) at 10/08/12 0920 Last data filed at 10/08/12 0800  Gross per 24 hour  Intake      0 ml  Output      0 ml  Net      0 ml    Last BM: PTA    Labs:   Recent Labs Lab 10/07/12 1042  NA 137  K 3.9  CL 97  CO2 30  BUN 19  CREATININE 0.21*  CALCIUM 8.5  GLUCOSE 281*    CBG (last 3)   Recent Labs  10/07/12 2346 10/08/12 0326 10/08/12 0825  GLUCAP 191* 171* 231*    Scheduled Meds: . aspirin  325 mg Per Tube Daily  . chlorhexidine  15 mL Mouth/Throat BID  . enoxaparin (LOVENOX) injection  40 mg Subcutaneous Q24H  . fentaNYL  25 mcg Transdermal Q72H  . ferrous sulfate  325 mg Oral BID  . free water  100 mL Per Tube Q6H  . insulin aspart  0-9 Units Subcutaneous Q4H  . metoprolol tartrate  25 mg Per Tube BID  . piperacillin-tazobactam (ZOSYN)  IV  3.375 g Intravenous Q8H  . senna  1 tablet Oral BID  . simvastatin  20 mg Oral q1800  . vancomycin  500 mg Intravenous Q12H    Continuous Infusions: . feeding supplement (GLUCERNA 1.2  CAL) 1,000 mL (10/07/12 2123)    Past Medical History  Diagnosis Date  . Hypertension   . Diabetes mellitus without complication   . Hyperlipidemia   . Back pain   . Insomnia   . Hyperlipidemia   . Stroke 2009  . Hemorrhoids   . History of recent fall 07/05/12  . Cancer 07/21/12    rectal, lymphadenopathy    Past Surgical History  Procedure Laterality Date  . Hip arthroplasty Left 2009    fall injury  . Abdominal hysterectomy      TAH, > 37 yrs ago  . Bladder suspension      many yrs ago    Clarene Duke RD, [place] Pager [phone] After Hours pager [phone]

## 2012-10-08 NOTE — Progress Notes (Signed)
VASCULAR LAB PRELIMINARY  PRELIMINARY  PRELIMINARY  PRELIMINARY  Bilateral lower extremity venous duplex completed.    Preliminary report:  Bilateral LEV per protocol for patients with DVT. LEFT - POSITIVE for acute occlusive  DVT coursing from the posterior tibial vein through the popliteal, profunda, femoral, and common femoral veins. RIGHT - Acute DVT noted in the proximal common femoral vein coursing from the saphenofemoral junction. There is no evidence of superficial thrombus bilaterally or a Baker's cyst.   Umberto Pavek, RVS 10/08/2012, 11:38 AM

## 2012-10-08 NOTE — Progress Notes (Addendum)
[facility] PROGRESS NOTE  Rachel Benton ZOX:[id number] DOB: 14-Dec-1940 DOA: 10/07/2012 PCP: Elby Showers, MD  Assessment/Plan: Acute encephalopathy  -Multifactorial including aspiration pneumonitis, opioids, and the possibility of new stroke.  -Repeat MRI brain pending read -CT brain in the ED suggested acute/subacute right inferior cerebellar infarct with evolution of her right MCA  -Neurology has been consulted by the emergency department  -plavix per neuro  DVT - will discuss with neuro about anticoagulation - if not then may need filter placed after goals of care with palliative  Fever  -Likely due to aspiration pneumonitis but could be due to clots -I have requested Blood cultures x 2 in ED  -Start empiric vancomycin and Zosyn pending culture data  -Urinalysis did not show significant pyuria   Dysphagia  -Resulting from her stroke at her last admission  -Patient required gastrostomy tube  -Continue gastrostomy tube feedings and monitor closely  -The patient will continue to be at risk for aspiration despite gastrostomy tube feedings. This was discussed with the patient's family although the family states that the patient's [facility] was not elevated at SNF   Right MCA infarct with cytotoxic cerebral edema and acute to subacute lacunar infarct in the posterior left MCA territory  -patient's mentation waxes and wanes throughout the day with periods of alertness and somnolence during last admission  -felt to be thromboembolic secondary to occluded right M1 noted on MRA - no signif stenosis on carotids   DIABETES MELLITUS, TYPE II  -hyperglycemia has been exacerbated by the patient's tube feedings  -Hemoglobin A1c 8.1 on 09/21/2012  -Start NovoLog sliding scale and 4 units NovoLog every 4 hours   HYPERLIPIDEMIA  On zocor   ANXIETY, MILD  -currently, patient denies any feelings of anxiety or jitteriness  On chronic benzo as outpt - clarified with family today that  patient only uses "every now and then" - stopped scheduled dosing and follow clinically with prn only   HYPERTENSION  -continue metoprolol tartrate 25 mg twice a day   Tachycardia/ocasional bursts of ST w/ frequent PAC's vs PAF  -likely due to infectious process, but had similar problem at last admission  -appears to be improving since restarting metoprolol tartrate at time of last admission  -previous EKG showed sinus with PACs  -place back on telemetry-->if Afib, may need to altered antiplatelet tx  -check EKG now   BACK PAIN, CHRONIC  Sclerotic area in the L1 vertebra which may be facet joint  hypertrophy but sclerotic metastasis could have the same.  Scoliosis. Compression fractures of T11 and T8 and possibly T4. I suspect  these are benign osteoporotic compression fractures.  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 Truett Perna   Protein-calorie malnutrition, severe  -Tube feeding ongoing via Gtube   Poor overall prognosis- asked for palliative care consult   Code Status: DNR Family Communication: husband Disposition Plan:    Consultants:  Neuro  palliative care  Procedures:    Antibiotics:    HPI/Subjective: Pain on right side  Objective: Filed Vitals:   10/08/12 1222  BP: 133/72  Pulse: 106  Temp: 97.5 F (36.4 C)  Resp: 18    Intake/Output Summary (Last 24 hours) at 10/08/12 1302 Last data filed at 10/08/12 0800  Gross per 24 hour  Intake      0 ml  Output      0 ml  Net      0 ml  Filed Weights   10/08/12 1222  Weight: 67.8 kg (149 lb 7.6 oz)    Exam:  General: , NAD, nontoxic, awakens to voice, but somnolent. Does not follow commands.  CV: RRR, no rub, no gallop, no S3  Lung: diminished breath sounds left base. Right clear to auscultation. No wheezing.  Abdomen: soft/NT, +BS, nondistended, no peritoneal signs; gastrostomy tube site without erythema or  induration  Ext:  1+LLE edema  Neuro: right facial droop, patient withdraws extremities to protopathic stimuli. Corneal reflex intact    Data Reviewed: Basic Metabolic Panel:  Recent Labs Lab 10/07/12 1042  NA 137  K 3.9  CL 97  CO2 30  GLUCOSE 281*  BUN 19  CREATININE 0.21*  CALCIUM 8.5   Liver Function Tests:  Recent Labs Lab 10/07/12 1042  AST 33  ALT 13  ALKPHOS 547*  BILITOT 0.4  PROT 5.9*  ALBUMIN 1.8*   No results found for this basename: LIPASE, AMYLASE,  in the last 168 hours No results found for this basename: AMMONIA,  in the last 168 hours CBC:  Recent Labs Lab 10/07/12 1042  WBC 7.3  HGB 8.4*  HCT 28.0*  MCV 85.1  PLT 254   Cardiac Enzymes: No results found for this basename: CKTOTAL, CKMB, CKMBINDEX, TROPONINI,  in the last 168 hours BNP (last 3 results) No results found for this basename: PROBNP,  in the last 8760 hours CBG:  Recent Labs Lab 10/07/12 2022 10/07/12 2346 10/08/12 0326 10/08/12 0825 10/08/12 1216  GLUCAP 216* 191* 171* 231* 226*    Recent Results (from the past 240 hour(s))  CULTURE, BLOOD (ROUTINE X 2)     Status: None   Collection Time    10/07/12  3:50 PM      Result Value Range Status   Specimen Description BLOOD THUMB LEFT   Final   Special Requests BOTTLES DRAWN AEROBIC ONLY 5CC   Final   Culture  Setup Time     Final   Value: 10/07/2012 21:17     Performed at [facility]   Culture     Final   Value:        BLOOD CULTURE RECEIVED NO GROWTH TO DATE CULTURE WILL BE HELD FOR 5 DAYS BEFORE ISSUING A FINAL NEGATIVE REPORT     Performed at [facility]   Report Status PENDING   Incomplete  CULTURE, BLOOD (ROUTINE X 2)     Status: None   Collection Time    10/07/12  3:57 PM      Result Value Range Status   Specimen Description BLOOD ARM RIGHT   Final   Special Requests BOTTLES DRAWN AEROBIC AND ANAEROBIC 10CC   Final   Culture  Setup Time     Final   Value: 10/07/2012 21:17     Performed at  [facility]   Culture     Final   Value:        BLOOD CULTURE RECEIVED NO GROWTH TO DATE CULTURE WILL BE HELD FOR 5 DAYS BEFORE ISSUING A FINAL NEGATIVE REPORT     Performed at [facility]   Report Status PENDING   Incomplete     Studies: Dg Cervical Spine 2 Or 3 Views  10/08/2012   *RADIOLOGY REPORT*  Clinical Data: Neck pain.History of metastatic rectal cancer.  CERVICAL SPINE - 2-3 VIEW  Comparison: None.  Findings: There is no fracture or subluxation or prevertebral soft tissue swelling.  No bone destruction.  Slight facet arthritis at C2-3 and C3-4.  IMPRESSION: No significant abnormality of the cervical spine.   Original Report Authenticated By: Francene Boyers, M.D.   Dg Thoracic Spine 2 View  10/08/2012   *RADIOLOGY REPORT*  Clinical Data: Back pain.  THORACIC SPINE - 2 VIEW  Comparison: CT scan of the chest dated 07/08/2012  Findings: There is a compression fracture of T11 which has increased since the prior CT scan.  There is a new compression fracture of T8 and there is suggestion of a compression fracture T4 although this is not definitive.  No visible bone destruction.  There is a thoracolumbar scoliosis.  IMPRESSION: Compression fractures of T11 and T8 and possibly T4.  I suspect these are benign osteoporotic compression fractures.   Original Report Authenticated By: Francene Boyers, M.D.   Dg Lumbar Spine 2-3 Views  10/08/2012   *RADIOLOGY REPORT*  Clinical Data: Back pain. History of metastatic rectal cancer.  LUMBAR SPINE - 2-3 VIEW  Comparison: CT scan of the abdomen and pelvis dated 07/08/2012  Findings: There is a slight lumbar scoliosis.  There is an increased compression fracture of T11. Slight increased density at the left facet joint of L1 which probably represents facet arthritis.  Sclerotic metastasis could give the same appearance however.  IMPRESSION: Sclerotic area in the L1 vertebra which may be facet joint hypertrophy but sclerotic metastasis could have the  same. Scoliosis.   Original Report Authenticated By: Francene Boyers, M.D.   Dg Shoulder Right  10/08/2012   *RADIOLOGY REPORT*  Clinical Data: Right shoulder pain.  Metastatic rectal cancer.  RIGHT SHOULDER - 2+ VIEW  Comparison: None.  Findings: There is no fracture or dislocation or significant arthritis.  There is osteopenia.  IMPRESSION: No acute abnormality.   Original Report Authenticated By: Francene Boyers, M.D.   Dg Abd 1 View  10/07/2012   *RADIOLOGY REPORT*  Clinical Data: Locally advanced rectal cancer.  Dysphagia. Altered mental status.  ABDOMEN - 1 VIEW  Comparison: Fluoroscopic spot films from a gastrostomy tube insertion 09/30/2012.  Findings: PEG tube in apparent good position. Moderate amount of contrast remains inspissated in the colon.  Gas pattern nonobstructive. Incompletely evaluated left hip fracture intramedullary rod and screw placement.  IMPRESSION: Satisfactory appearing PEG tube. Residual contrast in the colon suggests adynamic ileus.   Original Report Authenticated By: Davonna Belling, M.D.   Ct Head Wo Contrast  10/07/2012   CLINICAL DATA:  72 year old female with decreased level of consciousness. Recent right brain infarct.  EXAM: CT HEAD WITHOUT CONTRAST  TECHNIQUE: Contiguous axial images were obtained from the base of the skull through the vertex without intravenous contrast.  COMPARISON:  Brain MRI 09/22/2012 and earlier.  FINDINGS: Visualized paranasal sinuses and mastoids are clear. No acute osseous abnormality identified. Visualized orbits and scalp soft tissues are within normal limits.  Interval expected evolution of hypodensity in the right MCA territory. No associated mass effect. Chronic lacunar infarcts in the right thalamus and right cerebellum. However, there is a new focus of hypodensity in the inferior right cerebellum similar in size to the chronic lesion (series 2, image 6). No associated mass effect or hemorrhage. Elsewhere stable gray-white matter  differentiation in the posterior fossa.  Ventricular size and configuration are within normal limits. No suspicious intracranial vascular hyperdensity.  IMPRESSION: 1. New acute/subacute right inferior cerebellar infarct, located near the chronic infarct seen previously. No mass effect or hemorrhage.  2. Expected evolution of the recent right MCA infarct.   Electronically Signed  By: Augusto Gamble   On: 10/07/2012 13:46   Dg Chest Portable 1 View  10/07/2012   *RADIOLOGY REPORT*  Clinical Data: Fever, altered mental status, history hypertension, diabetes, stroke  PORTABLE CHEST - 1 VIEW  Comparison: Portable exam 1058 hours compared to 09/27/2012  Findings: Enlargement of cardiac silhouette. Tortuous aorta. Pulmonary vascularity normal. Persistent left basilar infiltrate. Mild atelectasis at right base. Accentuated perihilar markings with peribronchial thickening. No gross pleural effusion or pneumothorax.  IMPRESSION: Persistent left lower lobe infiltrate. Right basilar atelectasis. Bronchitic changes with accentuated perihilar markings, little changed.   Original Report Authenticated By: Ulyses Southward, M.D.   Dg Abd 2 Views  10/08/2012   *RADIOLOGY REPORT*  Clinical Data: Abdominal distention.  ABDOMEN - 2 VIEW 11:42 p.m.  Comparison: 09/30/2012 and 10/07/2012( 6:19 am)  Findings: Gastrostomy tube appears in good position.  There is decreased air in the colon.  The contrast in the colon has not significantly progressed.  No dilated small bowel.  No free air.  IMPRESSION: Significant decrease in the air in the colon.  The colon is no longer distended.   Original Report Authenticated By: Francene Boyers, M.D.    Scheduled Meds: . aspirin  325 mg Per Tube Daily  . chlorhexidine  15 mL Mouth/Throat BID  . enoxaparin (LOVENOX) injection  40 mg Subcutaneous Q24H  . fentaNYL  25 mcg Transdermal Q72H  . ferrous sulfate  325 mg Oral BID  . free water  100 mL Per Tube Q6H  . insulin aspart  0-9 Units Subcutaneous  Q4H  . metoprolol tartrate  25 mg Per Tube BID  . piperacillin-tazobactam (ZOSYN)  IV  3.375 g Intravenous Q8H  . senna  1 tablet Oral BID  . simvastatin  20 mg Oral q1800  . vancomycin  500 mg Intravenous Q12H   Continuous Infusions: . feeding supplement (GLUCERNA 1.2 CAL) 1,000 mL (10/07/12 2123)    Active Problems:   DIABETES MELLITUS, TYPE II   BACK PAIN, CHRONIC   Aspiration pneumonitis   Acute encephalopathy    Time spent: 35    [facility], Jayesh Marbach  [facility] Pager [phone]. If 7PM-7AM, please contact night-coverage at [email or web address], password [facility] 10/08/2012, 1:02 PM  LOS: 1 day

## 2012-10-08 NOTE — Evaluation (Signed)
Clinical/Bedside Swallow Evaluation Patient Details  Name: Rachel Benton MRN: [id number] Date of Birth: 1941/02/04  Today's Date: 10/08/2012 Time: 0825-0852 SLP Time Calculation (min): 27 min  Past Medical History:  Past Medical History  Diagnosis Date  . Hypertension   . Diabetes mellitus without complication   . Hyperlipidemia   . Back pain   . Insomnia   . Hyperlipidemia   . Stroke 2009  . Hemorrhoids   . History of recent fall 07/05/12  . Cancer 07/21/12    rectal, lymphadenopathy   Past Surgical History:  Past Surgical History  Procedure Laterality Date  . Hip arthroplasty Left 2009    fall injury  . Abdominal hysterectomy      TAH, > 37 yrs ago  . Bladder suspension      many yrs ago   HPI:  72 yo recent diagnosis of rectal cancer with rectal bleeding, with recent admit for CVA dc'd to [facility]- returning with dx of aspiration pneumonitis.  Pt has h/o dysphagia requiring PEG during last admission.  Pt has not been receiving po except applesauce and jello with SLP only at facility (per granddaughter Rosann Auerbach) and is nutritionally supported via PEG.  Pt has had a new neuro event, cerebellar cva.     Dg Cervical Spine 2 Or 3 Views  10/08/2012   *RADIOLOGY REPORT*  Clinical Data: Neck pain.History of metastatic rectal cancer.  CERVICAL SPINE - 2-3 VIEW  Comparison: None.  Findings: There is no fracture or subluxation or prevertebral soft tissue swelling.  No bone destruction.  Slight facet arthritis at C2-3 and C3-4.  IMPRESSION: No significant abnormality of the cervical spine.   Original Report Authenticated By: Francene Boyers, M.D.   Dg Thoracic Spine 2 View  10/08/2012   *RADIOLOGY REPORT*  Clinical Data: Back pain.  THORACIC SPINE - 2 VIEW  Comparison: CT scan of the chest dated 07/08/2012  Findings: There is a compression fracture of T11 which has increased since the prior CT scan.  There is a new compression fracture of T8 and there is suggestion of a compression  fracture T4 although this is not definitive.  No visible bone destruction.  There is a thoracolumbar scoliosis.  IMPRESSION: Compression fractures of T11 and T8 and possibly T4.  I suspect these are benign osteoporotic compression fractures.   Original Report Authenticated By: Francene Boyers, M.D.   Dg Lumbar Spine 2-3 Views  10/08/2012   *RADIOLOGY REPORT*  Clinical Data: Back pain. History of metastatic rectal cancer.  LUMBAR SPINE - 2-3 VIEW  Comparison: CT scan of the abdomen and pelvis dated 07/08/2012  Findings: There is a slight lumbar scoliosis.  There is an increased compression fracture of T11. Slight increased density at the left facet joint of L1 which probably represents facet arthritis.  Sclerotic metastasis could give the same appearance however.  IMPRESSION: Sclerotic area in the L1 vertebra which may be facet joint hypertrophy but sclerotic metastasis could have the same. Scoliosis.   Original Report Authenticated By: Francene Boyers, M.D.   Dg Shoulder Right  10/08/2012   *RADIOLOGY REPORT*  Clinical Data: Right shoulder pain.  Metastatic rectal cancer.  RIGHT SHOULDER - 2+ VIEW  Comparison: None.  Findings: There is no fracture or dislocation or significant arthritis.  There is osteopenia.  IMPRESSION: No acute abnormality.   Original Report Authenticated By: Francene Boyers, M.D.   Dg Abd 1 View  10/07/2012   *RADIOLOGY REPORT*  Clinical Data: Locally advanced rectal cancer.  Dysphagia. Altered  mental status.  ABDOMEN - 1 VIEW  Comparison: Fluoroscopic spot films from a gastrostomy tube insertion 09/30/2012.  Findings: PEG tube in apparent good position. Moderate amount of contrast remains inspissated in the colon.  Gas pattern nonobstructive. Incompletely evaluated left hip fracture intramedullary rod and screw placement.  IMPRESSION: Satisfactory appearing PEG tube. Residual contrast in the colon suggests adynamic ileus.   Original Report Authenticated By: Davonna Belling, M.D.   Ct Head Wo  Contrast  10/07/2012   CLINICAL DATA:  72 year old female with decreased level of consciousness. Recent right brain infarct.  EXAM: CT HEAD WITHOUT CONTRAST  TECHNIQUE: Contiguous axial images were obtained from the base of the skull through the vertex without intravenous contrast.  COMPARISON:  Brain MRI 09/22/2012 and earlier.  FINDINGS: Visualized paranasal sinuses and mastoids are clear. No acute osseous abnormality identified. Visualized orbits and scalp soft tissues are within normal limits.  Interval expected evolution of hypodensity in the right MCA territory. No associated mass effect. Chronic lacunar infarcts in the right thalamus and right cerebellum. However, there is a new focus of hypodensity in the inferior right cerebellum similar in size to the chronic lesion (series 2, image 6). No associated mass effect or hemorrhage. Elsewhere stable gray-white matter differentiation in the posterior fossa.  Ventricular size and configuration are within normal limits. No suspicious intracranial vascular hyperdensity.  IMPRESSION: 1. New acute/subacute right inferior cerebellar infarct, located near the chronic infarct seen previously. No mass effect or hemorrhage.  2. Expected evolution of the recent right MCA infarct.   Electronically Signed   By: Augusto Gamble   On: 10/07/2012 13:46   Dg Chest Portable 1 View  10/07/2012   *RADIOLOGY REPORT*  Clinical Data: Fever, altered mental status, history hypertension, diabetes, stroke  PORTABLE CHEST - 1 VIEW  Comparison: Portable exam 1058 hours compared to 09/27/2012  Findings: Enlargement of cardiac silhouette. Tortuous aorta. Pulmonary vascularity normal. Persistent left basilar infiltrate. Mild atelectasis at right base. Accentuated perihilar markings with peribronchial thickening. No gross pleural effusion or pneumothorax.  IMPRESSION: Persistent left lower lobe infiltrate. Right basilar atelectasis. Bronchitic changes with accentuated perihilar markings, little  changed.   Original Report Authenticated By: Ulyses Southward, M.D.   Dg Abd 2 Views  10/08/2012   *RADIOLOGY REPORT*  Clinical Data: Abdominal distention.  ABDOMEN - 2 VIEW 11:42 p.m.  Comparison: 09/30/2012 and 10/07/2012( 6:19 am)  Findings: Gastrostomy tube appears in good position.  There is decreased air in the colon.  The contrast in the colon has not significantly progressed.  No dilated small bowel.  No free air.  IMPRESSION: Significant decrease in the air in the colon.  The colon is no longer distended.   Original Report Authenticated By: Francene Boyers, M.D.    Assessment / Plan / Recommendation Clinical Impression  Pt. lethargic but participated in clinical swallow evaluation.  Pt was verbally responsive approx 50% of opportunities and is distracted/limited by her incredible amount of back pain. With max verbal/tactile cues to maintain awake state; keeps eyes closed but is mildly dyspnenic - likely due to pain.  With oral moisture via toothette, pt demonstrated immediate strong cough - suspect aspiraiton. Small ice chip placed in oral cavity with delayed swallow noted.  Pt's combination of pain prohibiting her from sitting fully upright, decreased alertness and awareness prevents pt. from oral intake presently.    Would recommend aggressive oral care, continued NPO status and follow up SLP to assess abiltity to initiate po's.  Skilled intevention included educating pt  and granddaughter Rosann Auerbach to findings, clinical reasoning for recommendations.  Per granddaughter, family desires pt to dc to [facility] for rehab when medically ready.      Aspiration Risk  Severe    Diet Recommendation NPO        Other  Recommendations Oral Care Recommendations: Oral care BID   Follow Up Recommendations   (TBD)    Frequency and Duration min 1 x/week  2 weeks   Pertinent Vitals/Pain Afebrile, decreased    SLP Swallow Goals Patient will utilize recommended strategies during swallow to increase  swallowing safety with: Maximal cueing Goal #3: Pt will maintain alertness and initiate pharygneal swallow within 10 seconds with moderate verbal cues without s/s of aspiraiton to aid in determining readiness for po intake.  Goal #4: Pt's family will verbalize importance of oral care for pulmonary health with modified independence.    Swallow Study Prior Functional Status   NPO except with SLP at facility PTA    General Date of Onset: 10/08/12 HPI: 72 yo recent diagnosis of rectal cancer with rectal bleeding, with recent admit for CVA dc'd to [facility]- returning with dx of aspiration pneumonitis.  Pt has h/o dysphagia requiring PEG during last admission.  Pt has not been receiving po except applesauce and jello with SLP only at facility (per granddaughter Rosann Auerbach) and is nutritionally supported via PEG.  .   Type of Study: Bedside swallow evaluation Previous Swallow Assessment: MBS severe oral dysphagia impacted by weakness, decreased attention with oral holding, etc, adequate pharyngeal function rec puree/nectar with rn and family only and alternative means of nutrition Diet Prior to this Study: NPO Temperature Spikes Noted: No Respiratory Status: Room air History of Recent Intubation: No Behavior/Cognition: Alert;Pleasant mood;Decreased sustained attention;Doesn't follow directions Oral Cavity - Dentition: Edentulous Self-Feeding Abilities: Total assist Patient Positioning: Postural control interferes with function (due to pain, pt unable to sit fully upright) Baseline Vocal Quality: Low vocal intensity Volitional Cough: Weak Volitional Swallow: Unable to elicit    Oral/Motor/Sensory Function Overall Oral Motor/Sensory Function: Impaired Labial ROM: Reduced left Labial Symmetry: Abnormal symmetry left Labial Strength: Reduced Labial Sensation: Reduced Lingual ROM: Reduced left Lingual Symmetry: Abnormal symmetry left Lingual Strength: Reduced Lingual Sensation: Reduced Facial  ROM: Reduced left Facial Symmetry: Left droop Facial Strength: Reduced Facial Sensation: Reduced Velum:  (DNT, pt did not open mouth to view) Mandible:  (DNT, pt grossly weak)   Ice Chips Ice chips: Impaired Presentation: Spoon Oral Phase Impairments: Reduced labial seal;Reduced lingual movement/coordination;Impaired anterior to posterior transit;Poor awareness of bolus Oral Phase Functional Implications: Oral holding;Prolonged oral transit Pharyngeal Phase Impairments: Suspected delayed Swallow (attempts to initiate, no swallow observed)   Thin Liquid Thin Liquid: Impaired Oral Phase Impairments: Impaired anterior to posterior transit;Reduced lingual movement/coordination Oral Phase Functional Implications: Prolonged oral transit Pharyngeal  Phase Impairments: Cough - Immediate;Suspected delayed Swallow Other Comments: moisture via toothette    Nectar Thick Nectar Thick Liquid: Not tested   Honey Thick Honey Thick Liquid: Not tested   Puree Puree: Not tested   Solid   GO    Solid: Not tested       Chales Abrahams 10/08/2012,9:17 AM

## 2012-10-08 NOTE — Consult Note (Signed)
Referring Physician: Benjamine Mola    Chief Complaint: new DVT requiring AC with recent large CVA  HPI:                                                                                                                                         Rachel Benton is an 72 y.o. female who was recently seen in [facility] hospital  09/21/12 for large right MCA infarct. 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 Patient was placed on ASA and placed in a SNF.  While in the facility patient suffered Aspiration PNA and found to have new bilateral LE DVT. MRI brain on this admission shows a small acute infarct in the right cerebellar hemisphere and continued white matter and to a lesser extent Basil ganglia restricted diffusion related to the recent moderate size right MCA infarct. Neurology was consulted for recommendations on [facility].  Palliative care is to meet with family later.       Date last known well: Unable to determine Time last known well: Unable to determine tPA Given: No: recent CVA 09/21/12  Past Medical History  Diagnosis Date  . Hypertension   . Diabetes mellitus without complication   . Hyperlipidemia   . Back pain   . Insomnia   . Hyperlipidemia   . Stroke 2009  . Hemorrhoids   . History of recent fall 07/05/12  . Cancer 07/21/12    rectal, lymphadenopathy    Past Surgical History  Procedure Laterality Date  . Hip arthroplasty Left 2009    fall injury  . Abdominal hysterectomy      TAH, > 37 yrs ago  . Bladder suspension      many yrs ago    Family History  Problem Relation Age of Onset  . Cancer Sister     lung  . Cancer Daughter     cervical  . Diabetes Mother    Social History:  reports that she has never smoked. She has never used smokeless tobacco. She reports that she does not drink alcohol or use illicit drugs.  Allergies:  Allergies  Allergen Reactions  . Ace Inhibitors   . Fish Oil      REACTION: GI  . Glipizide     REACTION: hypoglycemia  . Ibuprofen   . Rosiglitazone Maleate     REACTION: edema  . Sulfonamide Derivatives     Medications:  Scheduled: . chlorhexidine  15 mL Mouth/Throat BID  . [START ON 10/09/2012] clopidogrel  75 mg Oral Q breakfast  . enoxaparin (LOVENOX) injection  40 mg Subcutaneous Q24H  . fentaNYL  25 mcg Transdermal Q72H  . ferrous sulfate  325 mg Oral BID  . free water  100 mL Per Tube Q6H  . insulin aspart  0-9 Units Subcutaneous Q4H  . metoprolol tartrate  25 mg Per Tube BID  . piperacillin-tazobactam (ZOSYN)  IV  3.375 g Intravenous Q8H  . senna  1 tablet Oral BID  . simvastatin  20 mg Oral q1800  . vancomycin  500 mg Intravenous Q12H    ROS:                                                                                                                                       History obtained from the patient  General ROS: negative for - chills, fatigue, fever, night sweats, weight gain or weight loss Psychological ROS: negative for - behavioral disorder, hallucinations, memory difficulties, mood swings or suicidal ideation Ophthalmic ROS: negative for - blurry vision, double vision, eye pain or loss of vision ENT ROS: negative for - epistaxis, nasal discharge, oral lesions, sore throat, tinnitus or vertigo Allergy and Immunology ROS: negative for - hives or itchy/watery eyes Hematological and Lymphatic ROS: negative for - bleeding problems, bruising or swollen lymph nodes Endocrine ROS: negative for - galactorrhea, hair pattern changes, polydipsia/polyuria or temperature intolerance Respiratory ROS: negative for - cough, hemoptysis, shortness of breath or wheezing Cardiovascular ROS: negative for - chest pain, dyspnea on exertion, edema or irregular heartbeat Gastrointestinal ROS: negative for - abdominal pain,  diarrhea, hematemesis, nausea/vomiting or stool incontinence Genito-Urinary ROS: negative for - dysuria, hematuria, incontinence or urinary frequency/urgency Musculoskeletal ROS: negative for - joint swelling or muscular weakness Neurological ROS: as noted in HPI Dermatological ROS: negative for rash and skin lesion changes  Neurologic Examination:                                                                                                      Blood pressure 133/72, pulse 106, temperature 97.5 F (36.4 C), temperature source Oral, resp. rate 18, height 4\' 9"  (1.448 m), weight 67.8 kg (149 lb 7.6 oz), SpO2 92.00%.   Mental Status: Alert, oriented, able to recognize her granddaughter and husband at bedside.  Speechdysarthric without evidence of aphasia.  Able to follow simple commands without difficulty. Cranial Nerves: II: Discs flat bilaterally; Visual fields  shows a left hemianopsia, pupils equal, round, reactive to light and accommodation III,IV, VI: ptosis not present, extra-ocular motions intact bilaterally V,VII: smile asymmetric on the left, facial light touch sensation normal bilaterally VIII: hearing normal bilaterally IX,X: gag reflex present XI: bilateral shoulder shrug XII: midline tongue extension Motor: Right : Upper extremity   5/5    Left:     Upper extremity   flaccid  Lower extremity   5/5     Lower extremity   flaccid Tone and bulk:normal tone throughout; no atrophy noted Sensory: Pinprick and light touch intact throughout, bilaterally Deep Tendon Reflexes:  Right: Upper Extremity   Left: Upper extremity   biceps (C-5 to C-6) 2/4   biceps (C-5 to C-6) 2/4 tricep (C7) 2/4    triceps (C7) 2/4 Brachioradialis (C6) 2/4  Brachioradialis (C6) 2/4  Lower Extremity Lower Extremity  quadriceps (L-2 to L-4) 2/4   quadriceps (L-2 to L-4) 2/4 Achilles (S1) 1/4   Achilles (S1) 1/4  Plantars: Right: downgoing   Left: mute Cerebellar: normal finger-to-nose on the right,   CV: pulses palpable throughout    Results for orders placed during the hospital encounter of 10/07/12 (from the past 48 hour(s))  CBC     Status: Abnormal   Collection Time    10/07/12 10:42 AM      Result Value Range   WBC 7.3  4.0 - 10.5 K/uL   Comment: WHITE COUNT CONFIRMED ON SMEAR   RBC 3.29 (*) 3.87 - 5.11 MIL/uL   Hemoglobin 8.4 (*) 12.0 - 15.0 g/dL   HCT 16.1 (*) 09.6 - 04.5 %   MCV 85.1  78.0 - 100.0 fL   MCH 25.5 (*) 26.0 - 34.0 pg   MCHC 30.0  30.0 - 36.0 g/dL   RDW 40.9 (*) 81.1 - 91.4 %   Platelets 254  150 - 400 K/uL  COMPREHENSIVE METABOLIC PANEL     Status: Abnormal   Collection Time    10/07/12 10:42 AM      Result Value Range   Sodium 137  135 - 145 mEq/L   Potassium 3.9  3.5 - 5.1 mEq/L   Chloride 97  96 - 112 mEq/L   CO2 30  19 - 32 mEq/L   Glucose, Bld 281 (*) 70 - 99 mg/dL   BUN 19  6 - 23 mg/dL   Creatinine, Ser 7.82 (*) 0.50 - 1.10 mg/dL   Calcium 8.5  8.4 - 95.6 mg/dL   Total Protein 5.9 (*) 6.0 - 8.3 g/dL   Albumin 1.8 (*) 3.5 - 5.2 g/dL   AST 33  0 - 37 U/L   ALT 13  0 - 35 U/L   Alkaline Phosphatase 547 (*) 39 - 117 U/L   Total Bilirubin 0.4  0.3 - 1.2 mg/dL   GFR calc non Af Amer >90  >90 mL/min   GFR calc Af Amer >90  >90 mL/min   Comment: (NOTE)     The eGFR has been calculated using the CKD EPI equation.     This calculation has not been validated in all clinical situations.     eGFR's persistently <90 mL/min signify possible Chronic Kidney     Disease.  URINALYSIS, ROUTINE W REFLEX MICROSCOPIC     Status: Abnormal   Collection Time    10/07/12 11:14 AM      Result Value Range   Color, Urine AMBER (*) YELLOW   Comment: BIOCHEMICALS MAY BE AFFECTED BY COLOR   APPearance  CLEAR  CLEAR   Specific Gravity, Urine 1.037 (*) 1.005 - 1.030   pH 5.5  5.0 - 8.0   Glucose, UA 250 (*) NEGATIVE mg/dL   Hgb urine dipstick NEGATIVE  NEGATIVE   Bilirubin Urine SMALL (*) NEGATIVE   Ketones, ur 15 (*) NEGATIVE mg/dL   Protein, ur 30 (*) NEGATIVE  mg/dL   Urobilinogen, UA 1.0  0.0 - 1.0 mg/dL   Nitrite NEGATIVE  NEGATIVE   Leukocytes, UA TRACE (*) NEGATIVE  URINE MICROSCOPIC-ADD ON     Status: None   Collection Time    10/07/12 11:14 AM      Result Value Range   Squamous Epithelial / LPF RARE  RARE   WBC, UA 3-6  <3 WBC/hpf   Bacteria, UA RARE  RARE   Urine-Other MUCOUS PRESENT    CG4 I-STAT (LACTIC ACID)     Status: Abnormal   Collection Time    10/07/12 11:17 AM      Result Value Range   Lactic Acid, Venous 2.89 (*) 0.5 - 2.2 mmol/L  GLUCOSE, CAPILLARY     Status: Abnormal   Collection Time    10/07/12 11:23 AM      Result Value Range   Glucose-Capillary 242 (*) 70 - 99 mg/dL   Comment 1 Documented in Chart     Comment 2 Notify RN    CULTURE, BLOOD (ROUTINE X 2)     Status: None   Collection Time    10/07/12  3:50 PM      Result Value Range   Specimen Description BLOOD THUMB LEFT     Special Requests BOTTLES DRAWN AEROBIC ONLY 5CC     Culture  Setup Time       Value: 10/07/2012 21:17     Performed at [facility]   Culture       Value:        BLOOD CULTURE RECEIVED NO GROWTH TO DATE CULTURE WILL BE HELD FOR 5 DAYS BEFORE ISSUING A FINAL NEGATIVE REPORT     Performed at [facility]   Report Status PENDING    CULTURE, BLOOD (ROUTINE X 2)     Status: None   Collection Time    10/07/12  3:57 PM      Result Value Range   Specimen Description BLOOD ARM RIGHT     Special Requests BOTTLES DRAWN AEROBIC AND ANAEROBIC 10CC     Culture  Setup Time       Value: 10/07/2012 21:17     Performed at [facility]   Culture       Value:        BLOOD CULTURE RECEIVED NO GROWTH TO DATE CULTURE WILL BE HELD FOR 5 DAYS BEFORE ISSUING A FINAL NEGATIVE REPORT     Performed at [facility]   Report Status PENDING    GLUCOSE, CAPILLARY     Status: Abnormal   Collection Time    10/07/12  6:28 PM      Result Value Range   Glucose-Capillary 227 (*) 70 - 99 mg/dL  GLUCOSE, CAPILLARY     Status:  Abnormal   Collection Time    10/07/12  8:22 PM      Result Value Range   Glucose-Capillary 216 (*) 70 - 99 mg/dL  GLUCOSE, CAPILLARY     Status: Abnormal   Collection Time    10/07/12 11:46 PM      Result Value Range   Glucose-Capillary 191 (*)  70 - 99 mg/dL  GLUCOSE, CAPILLARY     Status: Abnormal   Collection Time    10/08/12  3:26 AM      Result Value Range   Glucose-Capillary 171 (*) 70 - 99 mg/dL  GLUCOSE, CAPILLARY     Status: Abnormal   Collection Time    10/08/12  8:25 AM      Result Value Range   Glucose-Capillary 231 (*) 70 - 99 mg/dL  GLUCOSE, CAPILLARY     Status: Abnormal   Collection Time    10/08/12 12:16 PM      Result Value Range   Glucose-Capillary 226 (*) 70 - 99 mg/dL   Dg Cervical Spine 2 Or 3 Views  10/08/2012   *RADIOLOGY REPORT*  Clinical Data: Neck pain.History of metastatic rectal cancer.  CERVICAL SPINE - 2-3 VIEW  Comparison: None.  Findings: There is no fracture or subluxation or prevertebral soft tissue swelling.  No bone destruction.  Slight facet arthritis at C2-3 and C3-4.  IMPRESSION: No significant abnormality of the cervical spine.   Original Report Authenticated By: Francene Boyers, M.D.   Dg Thoracic Spine 2 View  10/08/2012   *RADIOLOGY REPORT*  Clinical Data: Back pain.  THORACIC SPINE - 2 VIEW  Comparison: CT scan of the chest dated 07/08/2012  Findings: There is a compression fracture of T11 which has increased since the prior CT scan.  There is a new compression fracture of T8 and there is suggestion of a compression fracture T4 although this is not definitive.  No visible bone destruction.  There is a thoracolumbar scoliosis.  IMPRESSION: Compression fractures of T11 and T8 and possibly T4.  I suspect these are benign osteoporotic compression fractures.   Original Report Authenticated By: Francene Boyers, M.D.   Dg Lumbar Spine 2-3 Views  10/08/2012   *RADIOLOGY REPORT*  Clinical Data: Back pain. History of metastatic rectal cancer.  LUMBAR  SPINE - 2-3 VIEW  Comparison: CT scan of the abdomen and pelvis dated 07/08/2012  Findings: There is a slight lumbar scoliosis.  There is an increased compression fracture of T11. Slight increased density at the left facet joint of L1 which probably represents facet arthritis.  Sclerotic metastasis could give the same appearance however.  IMPRESSION: Sclerotic area in the L1 vertebra which may be facet joint hypertrophy but sclerotic metastasis could have the same. Scoliosis.   Original Report Authenticated By: Francene Boyers, M.D.   Dg Shoulder Right  10/08/2012   *RADIOLOGY REPORT*  Clinical Data: Right shoulder pain.  Metastatic rectal cancer.  RIGHT SHOULDER - 2+ VIEW  Comparison: None.  Findings: There is no fracture or dislocation or significant arthritis.  There is osteopenia.  IMPRESSION: No acute abnormality.   Original Report Authenticated By: Francene Boyers, M.D.   Dg Abd 1 View  10/07/2012   *RADIOLOGY REPORT*  Clinical Data: Locally advanced rectal cancer.  Dysphagia. Altered mental status.  ABDOMEN - 1 VIEW  Comparison: Fluoroscopic spot films from a gastrostomy tube insertion 09/30/2012.  Findings: PEG tube in apparent good position. Moderate amount of contrast remains inspissated in the colon.  Gas pattern nonobstructive. Incompletely evaluated left hip fracture intramedullary rod and screw placement.  IMPRESSION: Satisfactory appearing PEG tube. Residual contrast in the colon suggests adynamic ileus.   Original Report Authenticated By: Davonna Belling, M.D.   Ct Head Wo Contrast  10/07/2012   CLINICAL DATA:  72 year old female with decreased level of consciousness. Recent right brain infarct.  EXAM: CT HEAD WITHOUT CONTRAST  TECHNIQUE: Contiguous axial images were obtained from the base of the skull through the vertex without intravenous contrast.  COMPARISON:  Brain MRI 09/22/2012 and earlier.  FINDINGS: Visualized paranasal sinuses and mastoids are clear. No acute osseous abnormality  identified. Visualized orbits and scalp soft tissues are within normal limits.  Interval expected evolution of hypodensity in the right MCA territory. No associated mass effect. Chronic lacunar infarcts in the right thalamus and right cerebellum. However, there is a new focus of hypodensity in the inferior right cerebellum similar in size to the chronic lesion (series 2, image 6). No associated mass effect or hemorrhage. Elsewhere stable gray-white matter differentiation in the posterior fossa.  Ventricular size and configuration are within normal limits. No suspicious intracranial vascular hyperdensity.  IMPRESSION: 1. New acute/subacute right inferior cerebellar infarct, located near the chronic infarct seen previously. No mass effect or hemorrhage.  2. Expected evolution of the recent right MCA infarct.   Electronically Signed   By: Augusto Gamble   On: 10/07/2012 13:46   Mr Maxine Glenn Head Wo Contrast  10/08/2012   CLINICAL DATA:  72 year old female with right MCA infarct earlier this month. New abnormal area in the right cerebellum. Decreased level of consciousness.  EXAM: MRI HEAD WITHOUT CONTRAST  MRA HEAD WITHOUT CONTRAST  TECHNIQUE: Multiplanar, multiecho pulse sequences of the brain and surrounding structures were obtained without intravenous contrast. Angiographic images of the head were obtained using MRA technique without contrast.  COMPARISON:  Head CT without contrast 09/2012. Brain MRI 09/22/2012.  FINDINGS: MRI HEAD FINDINGS  Continued confluent restricted diffusion in the right MCA territory white matter, with interval more normalized diffusion in the right MCA cortex. Intermediate diffusion abnormality remains in the right Basil ganglia.  Corresponding to the finding on CT there is a new 12 mm focus of restricted diffusion in the right cerebellum adjacent to a chronic cerebellar lacunar infarct.  No associated mass effect or hemorrhage. Major intracranial vascular flow voids are stable. And Major  intracranial vascular flow voids are stable.  Stable gray and white matter signal elsewhere. No other posterior fossa and no left hemisphere diffusion abnormality. Stable ventricle size and configuration.  Visualized orbit soft tissues are within normal limits. Mastoid effusions have mildly increased. Paranasal sinuses remain clear. Stable bone marrow signal. Negative scalp soft tissues.  MRA HEAD FINDINGS  Today's exam is mildly more degraded by motion/noise than the the recent comparison.  Stable antegrade flow in the posterior circulation. The distal right vertebral artery demonstrates antegrade flow and appears stable. The proximal right PICA and is patent and appears stable.  Stable distal right vertebral artery, basilar artery, and bilateral PCA branches.  Stable antegrade flow in both ICA siphons. No ICA stenosis, and both carotid termini remain patent. Fetal type PCA origins again noted. Left MCA and bilateral ACA origins remain patent. Visualized bilateral ACA and left MCA branches appear stable.  The right MCA remains occluded just beyond its origin.  IMPRESSION: MRI HEAD IMPRESSION  1. Small acute infarct in the right cerebellar hemisphere corresponding to the CT finding. No mass effect or hemorrhage.  2. Continued white matter and to a lesser extent Basil ganglia restricted diffusion related to the recent moderate size right MCA infarct.  3. Stable brain otherwise.  MRA HEAD IMPRESSION  Stable intracranial MRA, with persistent right MCA occlusion. Stable appearance of the posterior circulation, and the origin and proximal aspect of the right PICA remains patent.   Electronically Signed   By: Augusto Gamble  On: 10/08/2012 14:07   Mr Brain Wo Contrast  10/08/2012   CLINICAL DATA:  72 year old female with right MCA infarct earlier this month. New abnormal area in the right cerebellum. Decreased level of consciousness.  EXAM: MRI HEAD WITHOUT CONTRAST  MRA HEAD WITHOUT CONTRAST  TECHNIQUE: Multiplanar,  multiecho pulse sequences of the brain and surrounding structures were obtained without intravenous contrast. Angiographic images of the head were obtained using MRA technique without contrast.  COMPARISON:  Head CT without contrast 09/2012. Brain MRI 09/22/2012.  FINDINGS: MRI HEAD FINDINGS  Continued confluent restricted diffusion in the right MCA territory white matter, with interval more normalized diffusion in the right MCA cortex. Intermediate diffusion abnormality remains in the right Basil ganglia.  Corresponding to the finding on CT there is a new 12 mm focus of restricted diffusion in the right cerebellum adjacent to a chronic cerebellar lacunar infarct.  No associated mass effect or hemorrhage. Major intracranial vascular flow voids are stable. And Major intracranial vascular flow voids are stable.  Stable gray and white matter signal elsewhere. No other posterior fossa and no left hemisphere diffusion abnormality. Stable ventricle size and configuration.  Visualized orbit soft tissues are within normal limits. Mastoid effusions have mildly increased. Paranasal sinuses remain clear. Stable bone marrow signal. Negative scalp soft tissues.  MRA HEAD FINDINGS  Today's exam is mildly more degraded by motion/noise than the the recent comparison.  Stable antegrade flow in the posterior circulation. The distal right vertebral artery demonstrates antegrade flow and appears stable. The proximal right PICA and is patent and appears stable.  Stable distal right vertebral artery, basilar artery, and bilateral PCA branches.  Stable antegrade flow in both ICA siphons. No ICA stenosis, and both carotid termini remain patent. Fetal type PCA origins again noted. Left MCA and bilateral ACA origins remain patent. Visualized bilateral ACA and left MCA branches appear stable.  The right MCA remains occluded just beyond its origin.  IMPRESSION: MRI HEAD IMPRESSION  1. Small acute infarct in the right cerebellar hemisphere  corresponding to the CT finding. No mass effect or hemorrhage.  2. Continued white matter and to a lesser extent Basil ganglia restricted diffusion related to the recent moderate size right MCA infarct.  3. Stable brain otherwise.  MRA HEAD IMPRESSION  Stable intracranial MRA, with persistent right MCA occlusion. Stable appearance of the posterior circulation, and the origin and proximal aspect of the right PICA remains patent.   Electronically Signed   By: Augusto Gamble   On: 10/08/2012 14:07   Dg Chest Portable 1 View  10/07/2012   *RADIOLOGY REPORT*  Clinical Data: Fever, altered mental status, history hypertension, diabetes, stroke  PORTABLE CHEST - 1 VIEW  Comparison: Portable exam 1058 hours compared to 09/27/2012  Findings: Enlargement of cardiac silhouette. Tortuous aorta. Pulmonary vascularity normal. Persistent left basilar infiltrate. Mild atelectasis at right base. Accentuated perihilar markings with peribronchial thickening. No gross pleural effusion or pneumothorax.  IMPRESSION: Persistent left lower lobe infiltrate. Right basilar atelectasis. Bronchitic changes with accentuated perihilar markings, little changed.   Original Report Authenticated By: Ulyses Southward, M.D.   Dg Abd 2 Views  10/08/2012   *RADIOLOGY REPORT*  Clinical Data: Abdominal distention.  ABDOMEN - 2 VIEW 11:42 p.m.  Comparison: 09/30/2012 and 10/07/2012( 6:19 am)  Findings: Gastrostomy tube appears in good position.  There is decreased air in the colon.  The contrast in the colon has not significantly progressed.  No dilated small bowel.  No free air.  IMPRESSION: Significant decrease  in the air in the colon.  The colon is no longer distended.   Original Report Authenticated By: Francene Boyers, M.D.    09/21/12 studies MRI of the brain 09/22/2012 1. Moderate sized confluent right MCA infarct with cytotoxic edema. No associated hemorrhage or mass effect at this time. 2. Possible superimposed acute to subacute lacunar infarct in the  posterior left MCA territory. 3. Otherwise, stable brain with expected evolution of the 2010 right thalamus lacunar infarct. 4. Bone marrow signal has become abnormal since 2010. This could be related to anemia due to GI bleeding or cancer therapy. Metastatic disease to bone is less likely. 5. Mild mastoid effusions.  MRA of the brain 09/22/2012 1. New right MCA occlusion just beyond its origin. 2. Otherwise stable intracranial MRA including proximal right PCA tandem stenoses and distal left PCA stenosis.  2D Echocardiogram EF 55-60% with no source of embolus. No falvular abnormalities.  Carotid Doppler Preliminary report: Bilateral: 1-39% ICA stenosis. Vertebral artery flow is antegrade        VASCULAR LAB 10/08/12 PRELIMINARY PRELIMINARY PRELIMINARY PRELIMINARY  Bilateral lower extremity venous duplex completed.  Preliminary report: Bilateral LEV per protocol for patients with DVT. LEFT - POSITIVE for acute occlusive DVT coursing from the posterior tibial vein through the popliteal, profunda, femoral, and common femoral veins. RIGHT - Acute DVT noted in the proximal common femoral vein coursing from the saphenofemoral junction. There is no evidence of superficial thrombus bilaterally or a Baker's cyst    Assessment and plan discussed with with attending physician and they are in agreement.    Felicie Morn PA-C [facility] [phone]  10/08/2012, 2:13 PM     Stroke Risk Factors - diabetes mellitus, hyperlipidemia and hypertension   Assessment: 72 y.o. female with bilateral LE DVT in the setting of recent large right MCA infarct an acute right cerebellar infarction, as well as recent concern for rectal bleeding secondary to her rectal cancer.  Due to recent concern of rectal bleeding and H/H of 8.4/28.0 patient likely is not a good candidate for anticoagulation. Palliative care is to meet with family to go over end of life options.  Recommendations: 1. Agree with having  patient and family meet with Palliative Care consultant to discuss options regarding extended care from this point. 2. IVC filter for management of bilateral lower extremity DVT 3. Continue Plavix 75 mg per day 4. Physical patient therapy intervention feasible  Venetia Maxon M.D. [facility] [phone]

## 2012-10-08 NOTE — Progress Notes (Signed)
Thank you for consulting the Palliative Medicine Team at [facility] to meet your patient's and family's needs.   The reason that you asked [place] to see your patient is for goals of care discussion  We have scheduled your patient for a meeting: tomorrow, Friday 10/09/12 @ 11:00 am  The Surrogate decision maker is: per patient "we all decide together" patient has 4 daughters and a son; all 4 daughters to be present - patient is married to second husband - per daughter her step-dad is 'tender-hearted' and may not be able to be at the meeting;    Other family members that need to be present: eldest dtr Marjie Skiff [phone]); dtr Jimmey Ralph [phone]); dtr Babs Bertin [phone]); dtr Darrin Nipper [phone])   Your patient is able to participate: per notes mental alertness waxes and wanes- on this visit patient talkative; answered and asked questions appropriately; engaged with family at bedside  Additional Narrative: on visit patient stated she hurt and 'has been hurting for years' wanted to know about pain medication- when asked about rating pain- pt stated "it's there all over' could not give a number; per family pt's R side and back have been the source of pain &  reports pain was not controlled at facility and this has caused great distress - currently patient has Fentanyl patch 25 mcg in place and has prn Morphine 1 mg q 4 hours Staff RN Maureen Ralphs aware and will medicate per current orders    Valente David, RN 10/08/2012, 5:41 PM Palliative Medicine Team RN Liaison [phone]

## 2012-10-08 NOTE — Progress Notes (Signed)
Inpatient Diabetes Program Recommendations  AACE/ADA: New Consensus Statement on Inpatient Glycemic Control (2013)  Target Ranges:  Prepandial:   less than 140 mg/dL      Peak postprandial:   less than 180 mg/dL (1-2 hours)      Critically ill patients:  140 - 180 mg/dL   Hyperglycemia on tube feeds   Pt on Glucerna @ 55 ml/hr.  Glucose is ranging in 200's In order to get any nutritional benefit of the tube feeds, pt needs the insulin to feed the body. Please add tube feed coverage of 4-5 units q 4 hrs in addition to correction.  Thank you, Lenor Coffin, RN, CNS, Diabetes Coordinator [phone])

## 2012-10-09 DIAGNOSIS — I6350 Cerebral infarction due to unspecified occlusion or stenosis of unspecified cerebral artery: Principal | ICD-10-CM

## 2012-10-09 DIAGNOSIS — Z515 Encounter for palliative care: Secondary | ICD-10-CM

## 2012-10-09 LAB — BASIC METABOLIC PANEL
CO2: 30 mEq/L (ref 19–32)
Chloride: 102 mEq/L (ref 96–112)
Glucose, Bld: 133 mg/dL — ABNORMAL HIGH (ref 70–99)
Potassium: 3.8 mEq/L (ref 3.5–5.1)
Sodium: 141 mEq/L (ref 135–145)

## 2012-10-09 LAB — CBC
Hemoglobin: 7.6 g/dL — ABNORMAL LOW (ref 12.0–15.0)
RBC: 2.98 MIL/uL — ABNORMAL LOW (ref 3.87–5.11)
WBC: 5.5 10*3/uL (ref 4.0–10.5)

## 2012-10-09 LAB — GLUCOSE, CAPILLARY

## 2012-10-09 MED ORDER — ALPRAZOLAM 0.25 MG PO TABS: 0.2500 mg | ORAL_TABLET | Freq: Three times a day (TID) | ORAL | Status: DC | PRN

## 2012-10-09 MED ORDER — ONDANSETRON HCL 4 MG/2ML IJ SOLN
INTRAMUSCULAR | Status: AC
  Filled 2012-10-09: qty 2

## 2012-10-09 MED ORDER — FENTANYL CITRATE 0.05 MG/ML IJ SOLN
25.0000 ug | Freq: Once | INTRAMUSCULAR | Status: AC
  Administered 2012-10-09: 25 ug via INTRAVENOUS

## 2012-10-09 MED ORDER — ACETAMINOPHEN 325 MG PO TABS
650.0000 mg | ORAL_TABLET | Freq: Four times a day (QID) | ORAL | Status: DC | PRN
  Administered 2012-10-12 – 2012-10-13 (×3): 650 mg
  Filled 2012-10-09 (×3): qty 2

## 2012-10-09 MED ORDER — OXYCODONE HCL 5 MG PO TABS
5.0000 mg | ORAL_TABLET | Freq: Once | ORAL | Status: AC
  Administered 2012-10-09: 5 mg
  Filled 2012-10-09: qty 1

## 2012-10-09 MED ORDER — CAMPHOR-MENTHOL 0.5-0.5 % EX LOTN
1.0000 "application " | TOPICAL_LOTION | CUTANEOUS | Status: DC | PRN
  Filled 2012-10-09 (×2): qty 222

## 2012-10-09 MED ORDER — ACETAMINOPHEN 650 MG RE SUPP
650.0000 mg | Freq: Four times a day (QID) | RECTAL | Status: DC | PRN
  Administered 2012-10-11: 650 mg via RECTAL
  Filled 2012-10-09: qty 1

## 2012-10-09 MED ORDER — FENTANYL CITRATE 0.05 MG/ML IJ SOLN
25.0000 ug | INTRAMUSCULAR | Status: DC | PRN
  Administered 2012-10-09 – 2012-10-10 (×11): 25 ug via INTRAVENOUS
  Filled 2012-10-09 (×11): qty 2

## 2012-10-09 MED ORDER — DIAZEPAM 5 MG/ML IJ SOLN
2.5000 mg | INTRAMUSCULAR | Status: DC | PRN
  Administered 2012-10-10 (×2): 2.5 mg via INTRAVENOUS
  Filled 2012-10-09 (×2): qty 2

## 2012-10-09 MED ORDER — BISACODYL 10 MG RE SUPP: 10.0000 mg | Freq: Every day | RECTAL | Status: DC | PRN

## 2012-10-09 MED ORDER — FENTANYL CITRATE 0.05 MG/ML IJ SOLN
INTRAMUSCULAR | Status: AC
  Filled 2012-10-09: qty 2

## 2012-10-09 MED ORDER — KETOROLAC TROMETHAMINE 30 MG/ML IJ SOLN
30.0000 mg | Freq: Once | INTRAMUSCULAR | Status: AC
  Administered 2012-10-09: 30 mg via INTRAVENOUS
  Filled 2012-10-09: qty 1

## 2012-10-09 MED ORDER — SORBITOL 70 % SOLN
30.0000 mL | Freq: Every day | Status: DC
  Administered 2012-10-09 – 2012-10-13 (×2): 30 mL
  Filled 2012-10-09 (×6): qty 30

## 2012-10-09 MED ORDER — LORAZEPAM 2 MG/ML IJ SOLN: 0.5000 mg | INTRAMUSCULAR | Status: DC | PRN

## 2012-10-09 MED ORDER — ONDANSETRON HCL 4 MG/2ML IJ SOLN
4.0000 mg | Freq: Once | INTRAMUSCULAR | Status: AC
  Administered 2012-10-09: 4 mg via INTRAVENOUS

## 2012-10-09 MED ORDER — ONDANSETRON HCL 4 MG/2ML IJ SOLN: 4.0000 mg | INTRAMUSCULAR | Status: DC | PRN

## 2012-10-09 NOTE — Consult Note (Signed)
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 She has been suffering intractably at facility according to her family-she cries out and her right leg shakes with pain. When asked to localize the pain she says it is all over but mostly in her right shoulder and hand. Her speech is slurred and delayed but she is understandable. PMT consult for goals of care and symptom management.  Participants in Discussion: Husband, 4 daughters, 1 grand-daughter and her sister. Patient was also able to participate but was severely limited by pain and required dose of IV pain medication before I could proceed with goals of care discussion.   Goals/Summary of Discussion:  DNR  Patient desires very aggressive symptom management even if it requires complete sedation. Before a rescue dose of IV pain medication was given for her agony, I was able to ask her in a very direct way what her current wishes were regarding her care- she clearly endorsed in her families presence that she wanted to be full comfort and not have her life unnecessarily prolonged in her current condition. She understood that this would mean that we would allow for a peaceful and natural death to occur and that she would likely require IV pain medication that would sedate her given the level of her suffering. I also told her that discontinuation of her tube feeding would be part of comfort and she said that she understands.  Family most concerned about the feeding issue- I again asserted that tube feeding is a medical intervention and really not a good choice based on  her goals and mostly not a good choice because her abdomen is distended and she may have obstruction or ileus from her rectal cancer. She also has PNA possibly from aspiration.   Would based on patient's very clear request de-escalate all medical interventions except those directly related to her comfort an QOL.  Comfort feeding as tolerated, careful hand feeding only.  Assessment: Rachel Benton is actively dying of complications related to her strokes, VTEs and rectal cancer. She has a very poor prognosis and very little chance of regaining a good QOL especially in light of her intractable pain.  Prognosis: Based on goals prognosis is days-less likely weeks.  RECOMMENDATIONS:   Will use IV Fentanyl q30 minutes prn for pain-once all family have seen her and given the chance to interact with her we need to start a continuous infusion and titrate for best pain control-once that is done we can remove the fentanyl patch-I think the acuity of her pain and EOL needs will be best treated with an infusion not a transdermal which has much less reliable bioavailability.  Stop Tube feeding and Free H2O for comfort-I provided extensive discussion on this topic to family  Ativan IV for agitation and spasm  Will also use IV Toradol for inflammatory pain and may be opiate sparing  She will likely be residential hospice appropriate, but I want to make sure we can get her in a more comfortable state and see how she does over the next 24 hours or so.  Will ask Chaplains to see patient  and provide support.  Family History: Family History  Problem Relation Age of Onset  . Cancer Sister     lung  . Cancer Daughter     cervical  . Diabetes Mother     Active Medications:  Outpatient medications: Prescriptions prior to admission  Medication Sig Dispense Refill  . acetaminophen (TYLENOL) 650 MG CR tablet Take 650 mg by mouth every 4 (four) hours.      Elmer Sow LIQD Take 60 mLs by mouth 3 (three) times  daily. Mixed in juice      . ALPRAZolam (XANAX) 0.25 MG tablet Take 0.25 mg by mouth 3 (three) times daily as needed for anxiety.      Marland Kitchen amoxicillin-clavulanate (AUGMENTIN) 875-125 MG per tablet Take 1 tablet by mouth 2 (two) times daily.      Marland Kitchen aspirin 325 MG tablet Place 1 tablet (325 mg total) into feeding tube daily.  30 tablet  0  . chlorhexidine (PERIDEX) 0.12 % solution Use as directed 15 mL in the mouth or throat 2 (two) times daily.  120 mL  0  . emollient (BIAFINE) cream Apply 1 application topically See admin instructions. To bottom after every bathroom trip      . ferrous sulfate 325 (65 FE) MG tablet Take 325 mg by mouth 2 (two) times daily.       . metFORMIN (GLUCOPHAGE) 1000 MG tablet Take 1,000 mg by mouth 2 (two) times daily with a meal.      . metoprolol tartrate (LOPRESSOR) 25 MG tablet Place 1 tablet (25 mg total) into feeding tube 2 (two) times daily.  60 tablet  0  . Nutritional Supplements (FEEDING SUPPLEMENT, GLUCERNA 1.2 CAL,) LIQD Place 1,000 mL into feeding tube continuous.  1200 mL  0  . OVER THE COUNTER MEDICATION Radiology cream to radiation burns on her mid section three times daily      . polyethylene glycol (MIRALAX / GLYCOLAX) packet Take 17 g by mouth daily as needed (constipation).       . pravastatin (PRAVACHOL) 40 MG tablet Take 40 mg by mouth daily.      . prochlorperazine (COMPAZINE) 10 MG tablet Take 10 mg by mouth every 6 (six) hours as needed (nausea).      . traMADol-acetaminophen (ULTRACET) 37.5-325 MG per tablet Take 1 tablet by mouth every 6 (six) hours as needed for pain.      . fentaNYL (DURAGESIC - DOSED MCG/HR) 25 MCG/HR patch Place 1 patch onto the skin every 3 (three) days.      . ondansetron (ZOFRAN) 4 MG tablet Take 4 mg by mouth every 8 (eight) hours as needed for nausea.        Current medications: Infusions: . feeding supplement (GLUCERNA 1.2 CAL) 1,000 mL (10/07/12 2123)    Scheduled Medications: . chlorhexidine  15 mL Mouth/Throat  BID  . clopidogrel  75 mg Oral Q breakfast  . enoxaparin (LOVENOX) injection  40 mg Subcutaneous Q24H  . fentaNYL  25 mcg Transdermal Q72H  . ferrous sulfate  325 mg Oral BID  . free water  100 mL Per Tube Q6H  . insulin aspart  0-9 Units Subcutaneous Q4H  . insulin aspart  5 Units Subcutaneous Q4H  . metoprolol tartrate  25 mg Per Tube BID  . piperacillin-tazobactam (ZOSYN)  IV  3.375 g Intravenous Q8H  . senna  1 tablet Oral BID  . simvastatin  20 mg Oral q1800  . vancomycin  500 mg Intravenous Q12H  PRN Medications: acetaminophen (TYLENOL) oral liquid 160 mg/5 mL, acetaminophen, acetaminophen, ALPRAZolam, diphenhydrAMINE, morphine injection, ondansetron (ZOFRAN) IV, ondansetron, polyethylene glycol   Vital Signs: BP 112/71  Pulse 95  Temp(Src) 98.3 F (36.8 C) (Oral)  Resp 20  Ht 4\' 9"  (1.448 m)  Wt 67.8 kg (149 lb 7.6 oz)  BMI 32.34 kg/m2  SpO2 96%   Physical Exam:  Frail, Pale elderly woman with significant left facial droop. Rhonchi in lungs. Abdomen distended, pain with light touch on her right side.Alert and oriented, right leg is shaking, she appears to be in agony and severe distress-repeats "help me".  Labs:  Basic or Comprehensive Metabolic Panel:    Component Value Date/Time   NA 141 10/09/2012 0630   NA 134* 08/18/2012 1324   K 3.8 10/09/2012 0630   K 4.5 08/18/2012 1324   CL 102 10/09/2012 0630   CO2 30 10/09/2012 0630   CO2 31* 08/18/2012 1324   BUN 18 10/09/2012 0630   BUN 14.4 08/18/2012 1324   CREATININE 0.28* 10/09/2012 0630   CREATININE 0.6 08/18/2012 1324   GLUCOSE 133* 10/09/2012 0630   GLUCOSE 269* 08/18/2012 1324   CALCIUM 8.3* 10/09/2012 0630   CALCIUM 8.9 08/18/2012 1324   AST 33 10/07/2012 1042   ALT 13 10/07/2012 1042   ALKPHOS 547* 10/07/2012 1042   BILITOT 0.4 10/07/2012 1042   PROT 5.9* 10/07/2012 1042   ALBUMIN 1.8* 10/07/2012 1042     CBC:    Component Value Date/Time   WBC 5.5 10/09/2012 0630   WBC 4.0 09/17/2012 1507   HGB 7.6* 10/09/2012 0630    HGB 10.0* 09/17/2012 1507   HCT 25.6* 10/09/2012 0630   HCT 31.8* 09/17/2012 1507   PLT 225 10/09/2012 0630   PLT 104 confirmed both analyzers* 09/17/2012 1507   MCV 85.9 10/09/2012 0630   MCV 79.1* 09/17/2012 1507   NEUTROABS 3.3 09/21/2012 1332   NEUTROABS 3.3 09/17/2012 1507   LYMPHSABS 0.4* 09/21/2012 1332   LYMPHSABS 0.3* 09/17/2012 1507   MONOABS 0.3 09/21/2012 1332   MONOABS 0.2 09/17/2012 1507   EOSABS 0.0 09/21/2012 1332   EOSABS 0.1 09/17/2012 1507   BASOSABS 0.0 09/21/2012 1332   BASOSABS 0.0 09/17/2012 1507     BNP (last 3 results) No results found for this basename: PROBNP,  in the last 8760 hours  CBG (last 3)   Recent Labs  10/09/12 0042 10/09/12 0537 10/09/12 0826  GLUCAP 155* 132* 114*    Imaging:  Dg Cervical Spine 2 Or 3 Views  10/08/2012   *RADIOLOGY REPORT*  Clinical Data: Neck pain.History of metastatic rectal cancer.  CERVICAL SPINE - 2-3 VIEW  Comparison: None.  Findings: There is no fracture or subluxation or prevertebral soft tissue swelling.  No bone destruction.  Slight facet arthritis at C2-3 and C3-4.  IMPRESSION: No significant abnormality of the cervical spine.   Original Report Authenticated By: Francene Boyers, M.D.   Dg Thoracic Spine 2 View  10/08/2012   *RADIOLOGY REPORT*  Clinical Data: Back pain.  THORACIC SPINE - 2 VIEW  Comparison: CT scan of the chest dated 07/08/2012  Findings: There is a compression fracture of T11 which has increased since the prior CT scan.  There is a new compression fracture of T8 and there is suggestion of a compression fracture T4 although this is not definitive.  No visible bone destruction.  There is a thoracolumbar scoliosis.  IMPRESSION: Compression fractures of T11 and T8 and possibly T4.  I suspect these are  benign osteoporotic compression fractures.   Original Report Authenticated By: Francene Boyers, M.D.   Dg Lumbar Spine 2-3 Views  10/08/2012   *RADIOLOGY REPORT*  Clinical Data: Back pain. History of metastatic rectal cancer.   LUMBAR SPINE - 2-3 VIEW  Comparison: CT scan of the abdomen and pelvis dated 07/08/2012  Findings: There is a slight lumbar scoliosis.  There is an increased compression fracture of T11. Slight increased density at the left facet joint of L1 which probably represents facet arthritis.  Sclerotic metastasis could give the same appearance however.  IMPRESSION: Sclerotic area in the L1 vertebra which may be facet joint hypertrophy but sclerotic metastasis could have the same. Scoliosis.   Original Report Authenticated By: Francene Boyers, M.D.   Dg Shoulder Right  10/08/2012   *RADIOLOGY REPORT*  Clinical Data: Right shoulder pain.  Metastatic rectal cancer.  RIGHT SHOULDER - 2+ VIEW  Comparison: None.  Findings: There is no fracture or dislocation or significant arthritis.  There is osteopenia.  IMPRESSION: No acute abnormality.   Original Report Authenticated By: Francene Boyers, M.D.   Dg Abd 1 View  10/07/2012   *RADIOLOGY REPORT*  Clinical Data: Locally advanced rectal cancer.  Dysphagia. Altered mental status.  ABDOMEN - 1 VIEW  Comparison: Fluoroscopic spot films from a gastrostomy tube insertion 09/30/2012.  Findings: PEG tube in apparent good position. Moderate amount of contrast remains inspissated in the colon.  Gas pattern nonobstructive. Incompletely evaluated left hip fracture intramedullary rod and screw placement.  IMPRESSION: Satisfactory appearing PEG tube. Residual contrast in the colon suggests adynamic ileus.   Original Report Authenticated By: Davonna Belling, M.D.   Ct Head Wo Contrast  10/07/2012   CLINICAL DATA:  72 year old female with decreased level of consciousness. Recent right brain infarct.  EXAM: CT HEAD WITHOUT CONTRAST  TECHNIQUE: Contiguous axial images were obtained from the base of the skull through the vertex without intravenous contrast.  COMPARISON:  Brain MRI 09/22/2012 and earlier.  FINDINGS: Visualized paranasal sinuses and mastoids are clear. No acute osseous abnormality  identified. Visualized orbits and scalp soft tissues are within normal limits.  Interval expected evolution of hypodensity in the right MCA territory. No associated mass effect. Chronic lacunar infarcts in the right thalamus and right cerebellum. However, there is a new focus of hypodensity in the inferior right cerebellum similar in size to the chronic lesion (series 2, image 6). No associated mass effect or hemorrhage. Elsewhere stable gray-white matter differentiation in the posterior fossa.  Ventricular size and configuration are within normal limits. No suspicious intracranial vascular hyperdensity.  IMPRESSION: 1. New acute/subacute right inferior cerebellar infarct, located near the chronic infarct seen previously. No mass effect or hemorrhage.  2. Expected evolution of the recent right MCA infarct.   Electronically Signed   By: Augusto Gamble   On: 10/07/2012 13:46   Mr Maxine Glenn Head Wo Contrast  10/08/2012   CLINICAL DATA:  72 year old female with right MCA infarct earlier this month. New abnormal area in the right cerebellum. Decreased level of consciousness.  EXAM: MRI HEAD WITHOUT CONTRAST  MRA HEAD WITHOUT CONTRAST  TECHNIQUE: Multiplanar, multiecho pulse sequences of the brain and surrounding structures were obtained without intravenous contrast. Angiographic images of the head were obtained using MRA technique without contrast.  COMPARISON:  Head CT without contrast 09/2012. Brain MRI 09/22/2012.  FINDINGS: MRI HEAD FINDINGS  Continued confluent restricted diffusion in the right MCA territory white matter, with interval more normalized diffusion in the right MCA cortex. Intermediate diffusion abnormality  remains in the right Basil ganglia.  Corresponding to the finding on CT there is a new 12 mm focus of restricted diffusion in the right cerebellum adjacent to a chronic cerebellar lacunar infarct.  No associated mass effect or hemorrhage. Major intracranial vascular flow voids are stable. And Major  intracranial vascular flow voids are stable.  Stable gray and white matter signal elsewhere. No other posterior fossa and no left hemisphere diffusion abnormality. Stable ventricle size and configuration.  Visualized orbit soft tissues are within normal limits. Mastoid effusions have mildly increased. Paranasal sinuses remain clear. Stable bone marrow signal. Negative scalp soft tissues.  MRA HEAD FINDINGS  Today's exam is mildly more degraded by motion/noise than the the recent comparison.  Stable antegrade flow in the posterior circulation. The distal right vertebral artery demonstrates antegrade flow and appears stable. The proximal right PICA and is patent and appears stable.  Stable distal right vertebral artery, basilar artery, and bilateral PCA branches.  Stable antegrade flow in both ICA siphons. No ICA stenosis, and both carotid termini remain patent. Fetal type PCA origins again noted. Left MCA and bilateral ACA origins remain patent. Visualized bilateral ACA and left MCA branches appear stable.  The right MCA remains occluded just beyond its origin.  IMPRESSION: MRI HEAD IMPRESSION  1. Small acute infarct in the right cerebellar hemisphere corresponding to the CT finding. No mass effect or hemorrhage.  2. Continued white matter and to a lesser extent Basil ganglia restricted diffusion related to the recent moderate size right MCA infarct.  3. Stable brain otherwise.  MRA HEAD IMPRESSION  Stable intracranial MRA, with persistent right MCA occlusion. Stable appearance of the posterior circulation, and the origin and proximal aspect of the right PICA remains patent.   Electronically Signed   By: Augusto Gamble   On: 10/08/2012 14:07   Mr Brain Wo Contrast  10/08/2012   CLINICAL DATA:  73 year old female with right MCA infarct earlier this month. New abnormal area in the right cerebellum. Decreased level of consciousness.  EXAM: MRI HEAD WITHOUT CONTRAST  MRA HEAD WITHOUT CONTRAST  TECHNIQUE: Multiplanar,  multiecho pulse sequences of the brain and surrounding structures were obtained without intravenous contrast. Angiographic images of the head were obtained using MRA technique without contrast.  COMPARISON:  Head CT without contrast 09/2012. Brain MRI 09/22/2012.  FINDINGS: MRI HEAD FINDINGS  Continued confluent restricted diffusion in the right MCA territory white matter, with interval more normalized diffusion in the right MCA cortex. Intermediate diffusion abnormality remains in the right Basil ganglia.  Corresponding to the finding on CT there is a new 12 mm focus of restricted diffusion in the right cerebellum adjacent to a chronic cerebellar lacunar infarct.  No associated mass effect or hemorrhage. Major intracranial vascular flow voids are stable. And Major intracranial vascular flow voids are stable.  Stable gray and white matter signal elsewhere. No other posterior fossa and no left hemisphere diffusion abnormality. Stable ventricle size and configuration.  Visualized orbit soft tissues are within normal limits. Mastoid effusions have mildly increased. Paranasal sinuses remain clear. Stable bone marrow signal. Negative scalp soft tissues.  MRA HEAD FINDINGS  Today's exam is mildly more degraded by motion/noise than the the recent comparison.  Stable antegrade flow in the posterior circulation. The distal right vertebral artery demonstrates antegrade flow and appears stable. The proximal right PICA and is patent and appears stable.  Stable distal right vertebral artery, basilar artery, and bilateral PCA branches.  Stable antegrade flow in both ICA  siphons. No ICA stenosis, and both carotid termini remain patent. Fetal type PCA origins again noted. Left MCA and bilateral ACA origins remain patent. Visualized bilateral ACA and left MCA branches appear stable.  The right MCA remains occluded just beyond its origin.  IMPRESSION: MRI HEAD IMPRESSION  1. Small acute infarct in the right cerebellar hemisphere  corresponding to the CT finding. No mass effect or hemorrhage.  2. Continued white matter and to a lesser extent Basil ganglia restricted diffusion related to the recent moderate size right MCA infarct.  3. Stable brain otherwise.  MRA HEAD IMPRESSION  Stable intracranial MRA, with persistent right MCA occlusion. Stable appearance of the posterior circulation, and the origin and proximal aspect of the right PICA remains patent.   Electronically Signed   By: Augusto Gamble   On: 10/08/2012 14:07   Dg Abd 2 Views  10/08/2012   *RADIOLOGY REPORT*  Clinical Data: Abdominal distention.  ABDOMEN - 2 VIEW 11:42 p.m.  Comparison: 09/30/2012 and 10/07/2012( 6:19 am)  Findings: Gastrostomy tube appears in good position.  There is decreased air in the colon.  The contrast in the colon has not significantly progressed.  No dilated small bowel.  No free air.  IMPRESSION: Significant decrease in the air in the colon.  The colon is no longer distended.   Original Report Authenticated By: Francene Boyers, M.D.    Other Data:  (2D echo, EKG...)   Educational Materials Given:  DNR: Yes  MOST: No Healthcare Power-of-Attorney: No   Time: 70 minutes Greater than 50%  of this time was spent counseling and coordinating care related to the above assessment and plan.  Signed by: Edsel Petrin, DO  10/09/2012, 11:46 AM  Please contact Palliative Medicine Team phone at [phone] for questions and concerns.

## 2012-10-09 NOTE — Progress Notes (Signed)
[facility] PROGRESS NOTE  Rachel Benton:[id number] DOB: 12-10-1940 DOA: 10/07/2012 PCP: Elby Showers, MD  Assessment/Plan: Now comfort care- appreciate palliative care  Acute encephalopathy  -Multifactorial including aspiration pneumonitis, opioids, and the possibility of new stroke.  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 Does not follow commands.  CV: RRR, no rub, no gallop, no S3  Lung: diminished breath sounds left base. Right clear to auscultation. No wheezing.  Abdomen: soft/NT, +BS, nondistended, no peritoneal signs; gastrostomy tube site without erythema or induration  Ext:  1+LLE edema  Neuro: right facial droop, patient withdraws extremities to protopathic stimuli. Corneal reflex intact    Data Reviewed: Basic Metabolic Panel:  Recent  Labs Lab 10/07/12 1042 10/09/12 0630  NA 137 141  K 3.9 3.8  CL 97 102  CO2 30 30  GLUCOSE 281* 133*  BUN 19 18  CREATININE 0.21* 0.28*  CALCIUM 8.5 8.3*   Liver Function Tests:  Recent Labs Lab 10/07/12 1042  AST 33  ALT 13  ALKPHOS 547*  BILITOT 0.4  PROT 5.9*  ALBUMIN 1.8*   No results found for this basename: LIPASE, AMYLASE,  in the last 168 hours No results found for this basename: AMMONIA,  in the last 168 hours CBC:  Recent Labs Lab 10/07/12 1042 10/09/12 0630  WBC 7.3 5.5  HGB 8.4* 7.6*  HCT 28.0* 25.6*  MCV 85.1 85.9  PLT 254 225   Cardiac Enzymes: No results found for this basename: CKTOTAL, CKMB, CKMBINDEX, TROPONINI,  in the last 168 hours BNP (last 3 results) No results found for this basename: PROBNP,  in the last 8760 hours CBG:  Recent Labs Lab 10/08/12 2040 10/09/12 0042 10/09/12 0537 10/09/12 0826 10/09/12 1146  GLUCAP 170* 155* 132* 114* 94    Recent Results (from the past 240 hour(s))  CULTURE, BLOOD (ROUTINE X 2)     Status: None   Collection Time    10/07/12  3:50 PM      Result Value Range Status   Specimen Description BLOOD THUMB LEFT   Final   Special Requests BOTTLES DRAWN AEROBIC ONLY 5CC   Final   Culture  Setup Time     Final   Value: 10/07/2012 21:17     Performed at [facility]  Culture     Final   Value:        BLOOD CULTURE RECEIVED NO GROWTH TO DATE CULTURE WILL BE HELD FOR 5 DAYS BEFORE ISSUING A FINAL NEGATIVE REPORT     Performed at [facility]   Report Status PENDING   Incomplete  CULTURE, BLOOD (ROUTINE X 2)     Status: None   Collection Time    10/07/12  3:57 PM      Result Value Range Status   Specimen Description BLOOD ARM RIGHT   Final   Special Requests BOTTLES DRAWN AEROBIC AND ANAEROBIC 10CC   Final   Culture  Setup Time     Final   Value: 10/07/2012 21:17     Performed at [facility]   Culture     Final   Value:        BLOOD CULTURE RECEIVED NO GROWTH TO DATE  CULTURE WILL BE HELD FOR 5 DAYS BEFORE ISSUING A FINAL NEGATIVE REPORT     Performed at [facility]   Report Status PENDING   Incomplete     Studies: Dg Cervical Spine 2 Or 3 Views  10/08/2012   *RADIOLOGY REPORT*  Clinical Data: Neck pain.History of metastatic rectal cancer.  CERVICAL SPINE - 2-3 VIEW  Comparison: None.  Findings: There is no fracture or subluxation or prevertebral soft tissue swelling.  No bone destruction.  Slight facet arthritis at C2-3 and C3-4.  IMPRESSION: No significant abnormality of the cervical spine.   Original Report Authenticated By: Francene Boyers, M.D.   Dg Thoracic Spine 2 View  10/08/2012   *RADIOLOGY REPORT*  Clinical Data: Back pain.  THORACIC SPINE - 2 VIEW  Comparison: CT scan of the chest dated 07/08/2012  Findings: There is a compression fracture of T11 which has increased since the prior CT scan.  There is a new compression fracture of T8 and there is suggestion of a compression fracture T4 although this is not definitive.  No visible bone destruction.  There is a thoracolumbar scoliosis.  IMPRESSION: Compression fractures of T11 and T8 and possibly T4.  I suspect these are benign osteoporotic compression fractures.   Original Report Authenticated By: Francene Boyers, M.D.   Dg Lumbar Spine 2-3 Views  10/08/2012   *RADIOLOGY REPORT*  Clinical Data: Back pain. History of metastatic rectal cancer.  LUMBAR SPINE - 2-3 VIEW  Comparison: CT scan of the abdomen and pelvis dated 07/08/2012  Findings: There is a slight lumbar scoliosis.  There is an increased compression fracture of T11. Slight increased density at the left facet joint of L1 which probably represents facet arthritis.  Sclerotic metastasis could give the same appearance however.  IMPRESSION: Sclerotic area in the L1 vertebra which may be facet joint hypertrophy but sclerotic metastasis could have the same. Scoliosis.   Original Report Authenticated By: Francene Boyers, M.D.   Dg Shoulder  Right  10/08/2012   *RADIOLOGY REPORT*  Clinical Data: Right shoulder pain.  Metastatic rectal cancer.  RIGHT SHOULDER - 2+ VIEW  Comparison: None.  Findings: There is no fracture or dislocation or significant arthritis.  There is osteopenia.  IMPRESSION: No acute abnormality.   Original Report Authenticated By: Francene Boyers, M.D.   Ct Head Wo Contrast  10/07/2012   CLINICAL DATA:  72 year old female with decreased level of consciousness. Recent right brain infarct.  EXAM: CT HEAD WITHOUT CONTRAST  TECHNIQUE: Contiguous axial images were obtained from the base of the skull through the vertex without  intravenous contrast.  COMPARISON:  Brain MRI 09/22/2012 and earlier.  FINDINGS: Visualized paranasal sinuses and mastoids are clear. No acute osseous abnormality identified. Visualized orbits and scalp soft tissues are within normal limits.  Interval expected evolution of hypodensity in the right MCA territory. No associated mass effect. Chronic lacunar infarcts in the right thalamus and right cerebellum. However, there is a new focus of hypodensity in the inferior right cerebellum similar in size to the chronic lesion (series 2, image 6). No associated mass effect or hemorrhage. Elsewhere stable gray-white matter differentiation in the posterior fossa.  Ventricular size and configuration are within normal limits. No suspicious intracranial vascular hyperdensity.  IMPRESSION: 1. New acute/subacute right inferior cerebellar infarct, located near the chronic infarct seen previously. No mass effect or hemorrhage.  2. Expected evolution of the recent right MCA infarct.   Electronically Signed   By: Augusto Gamble   On: 10/07/2012 13:46   Mr Maxine Glenn Head Wo Contrast  10/08/2012   CLINICAL DATA:  72 year old female with right MCA infarct earlier this month. New abnormal area in the right cerebellum. Decreased level of consciousness.  EXAM: MRI HEAD WITHOUT CONTRAST  MRA HEAD WITHOUT CONTRAST  TECHNIQUE: Multiplanar, multiecho  pulse sequences of the brain and surrounding structures were obtained without intravenous contrast. Angiographic images of the head were obtained using MRA technique without contrast.  COMPARISON:  Head CT without contrast 09/2012. Brain MRI 09/22/2012.  FINDINGS: MRI HEAD FINDINGS  Continued confluent restricted diffusion in the right MCA territory white matter, with interval more normalized diffusion in the right MCA cortex. Intermediate diffusion abnormality remains in the right Basil ganglia.  Corresponding to the finding on CT there is a new 12 mm focus of restricted diffusion in the right cerebellum adjacent to a chronic cerebellar lacunar infarct.  No associated mass effect or hemorrhage. Major intracranial vascular flow voids are stable. And Major intracranial vascular flow voids are stable.  Stable gray and white matter signal elsewhere. No other posterior fossa and no left hemisphere diffusion abnormality. Stable ventricle size and configuration.  Visualized orbit soft tissues are within normal limits. Mastoid effusions have mildly increased. Paranasal sinuses remain clear. Stable bone marrow signal. Negative scalp soft tissues.  MRA HEAD FINDINGS  Today's exam is mildly more degraded by motion/noise than the the recent comparison.  Stable antegrade flow in the posterior circulation. The distal right vertebral artery demonstrates antegrade flow and appears stable. The proximal right PICA and is patent and appears stable.  Stable distal right vertebral artery, basilar artery, and bilateral PCA branches.  Stable antegrade flow in both ICA siphons. No ICA stenosis, and both carotid termini remain patent. Fetal type PCA origins again noted. Left MCA and bilateral ACA origins remain patent. Visualized bilateral ACA and left MCA branches appear stable.  The right MCA remains occluded just beyond its origin.  IMPRESSION: MRI HEAD IMPRESSION  1. Small acute infarct in the right cerebellar hemisphere corresponding  to the CT finding. No mass effect or hemorrhage.  2. Continued white matter and to a lesser extent Basil ganglia restricted diffusion related to the recent moderate size right MCA infarct.  3. Stable brain otherwise.  MRA HEAD IMPRESSION  Stable intracranial MRA, with persistent right MCA occlusion. Stable appearance of the posterior circulation, and the origin and proximal aspect of the right PICA remains patent.   Electronically Signed   By: Augusto Gamble   On: 10/08/2012 14:07   Mr Brain Wo Contrast  10/08/2012   CLINICAL DATA:  72 year old female with right MCA infarct earlier this month. New abnormal area in the right cerebellum. Decreased level of consciousness.  EXAM: MRI HEAD WITHOUT CONTRAST  MRA HEAD WITHOUT CONTRAST  TECHNIQUE: Multiplanar, multiecho pulse sequences of the brain and surrounding structures were obtained without intravenous contrast. Angiographic images of the head were obtained using MRA technique without contrast.  COMPARISON:  Head CT without contrast 09/2012. Brain MRI 09/22/2012.  FINDINGS: MRI HEAD FINDINGS  Continued confluent restricted diffusion in the right MCA territory white matter, with interval more normalized diffusion in the right MCA cortex. Intermediate diffusion abnormality remains in the right Basil ganglia.  Corresponding to the finding on CT there is a new 12 mm focus of restricted diffusion in the right cerebellum adjacent to a chronic cerebellar lacunar infarct.  No associated mass effect or hemorrhage. Major intracranial vascular flow voids are stable. And Major intracranial vascular flow voids are stable.  Stable gray and white matter signal elsewhere. No other posterior fossa and no left hemisphere diffusion abnormality. Stable ventricle size and configuration.  Visualized orbit soft tissues are within normal limits. Mastoid effusions have mildly increased. Paranasal sinuses remain clear. Stable bone marrow signal. Negative scalp soft tissues.  MRA HEAD FINDINGS   Today's exam is mildly more degraded by motion/noise than the the recent comparison.  Stable antegrade flow in the posterior circulation. The distal right vertebral artery demonstrates antegrade flow and appears stable. The proximal right PICA and is patent and appears stable.  Stable distal right vertebral artery, basilar artery, and bilateral PCA branches.  Stable antegrade flow in both ICA siphons. No ICA stenosis, and both carotid termini remain patent. Fetal type PCA origins again noted. Left MCA and bilateral ACA origins remain patent. Visualized bilateral ACA and left MCA branches appear stable.  The right MCA remains occluded just beyond its origin.  IMPRESSION: MRI HEAD IMPRESSION  1. Small acute infarct in the right cerebellar hemisphere corresponding to the CT finding. No mass effect or hemorrhage.  2. Continued white matter and to a lesser extent Basil ganglia restricted diffusion related to the recent moderate size right MCA infarct.  3. Stable brain otherwise.  MRA HEAD IMPRESSION  Stable intracranial MRA, with persistent right MCA occlusion. Stable appearance of the posterior circulation, and the origin and proximal aspect of the right PICA remains patent.   Electronically Signed   By: Augusto Gamble   On: 10/08/2012 14:07   Dg Abd 2 Views  10/08/2012   *RADIOLOGY REPORT*  Clinical Data: Abdominal distention.  ABDOMEN - 2 VIEW 11:42 p.m.  Comparison: 09/30/2012 and 10/07/2012( 6:19 am)  Findings: Gastrostomy tube appears in good position.  There is decreased air in the colon.  The contrast in the colon has not significantly progressed.  No dilated small bowel.  No free air.  IMPRESSION: Significant decrease in the air in the colon.  The colon is no longer distended.   Original Report Authenticated By: Francene Boyers, M.D.    Scheduled Meds: . chlorhexidine  15 mL Mouth/Throat BID  . clopidogrel  75 mg Oral Q breakfast  . enoxaparin (LOVENOX) injection  40 mg Subcutaneous Q24H  . fentaNYL  25 mcg  Transdermal Q72H  . ferrous sulfate  325 mg Oral BID  . free water  100 mL Per Tube Q6H  . insulin aspart  0-9 Units Subcutaneous Q4H  . insulin aspart  5 Units Subcutaneous Q4H  . metoprolol tartrate  25 mg Per Tube BID  . piperacillin-tazobactam (ZOSYN)  IV  3.375 g Intravenous Q8H  . senna  1 tablet Oral BID  . simvastatin  20 mg Oral q1800  . vancomycin  500 mg Intravenous Q12H   Continuous Infusions: . feeding supplement (GLUCERNA 1.2 CAL) 1,000 mL (10/07/12 2123)    Active Problems:   DIABETES MELLITUS, TYPE II   BACK PAIN, CHRONIC   Aspiration pneumonitis   Acute encephalopathy   Cerebellar stroke, acute   CVA (cerebral infarction)    Time spent: 25    [facility], Winna Golla  [facility] Pager [phone]. If 7PM-7AM, please contact night-coverage at [email or web address], password [facility] 10/09/2012, 12:29 PM  LOS: 2 days

## 2012-10-10 MED ORDER — SODIUM CHLORIDE 0.9 % IV SOLN
20.0000 ug/h | INTRAVENOUS | Status: DC
  Administered 2012-10-10: 10 ug/h via INTRAVENOUS
  Administered 2012-10-12: 20 ug/h via INTRAVENOUS
  Filled 2012-10-10 (×2): qty 50

## 2012-10-10 MED ORDER — FENTANYL BOLUS VIA INFUSION
50.0000 ug | INTRAVENOUS | Status: DC | PRN
  Filled 2012-10-10: qty 50

## 2012-10-10 MED ORDER — GLUCERNA SHAKE PO LIQD
237.0000 mL | Freq: Three times a day (TID) | ORAL | Status: DC | PRN
  Administered 2012-10-10 – 2012-10-14 (×8): 237 mL via ORAL

## 2012-10-10 MED ORDER — DIAZEPAM 5 MG/ML IJ SOLN
5.0000 mg | INTRAMUSCULAR | Status: DC | PRN
  Administered 2012-10-10 – 2012-10-13 (×6): 5 mg via INTRAVENOUS
  Filled 2012-10-10 (×6): qty 2

## 2012-10-10 MED ORDER — KETOROLAC TROMETHAMINE 30 MG/ML IJ SOLN
30.0000 mg | Freq: Once | INTRAMUSCULAR | Status: DC
  Filled 2012-10-10: qty 1

## 2012-10-10 MED ORDER — DIPHENHYDRAMINE HCL 50 MG/ML IJ SOLN
12.5000 mg | Freq: Four times a day (QID) | INTRAMUSCULAR | Status: DC | PRN
  Administered 2012-10-10 – 2012-10-12 (×4): 12.5 mg via INTRAVENOUS
  Filled 2012-10-10 (×4): qty 1

## 2012-10-10 MED ORDER — FENTANYL CITRATE 0.05 MG/ML IJ SOLN
50.0000 ug | INTRAMUSCULAR | Status: DC | PRN
  Administered 2012-10-10 – 2012-10-14 (×47): 50 ug via INTRAVENOUS
  Filled 2012-10-10 (×50): qty 2

## 2012-10-10 MED ORDER — FAMOTIDINE IN NACL 20-0.9 MG/50ML-% IV SOLN
20.0000 mg | Freq: Two times a day (BID) | INTRAVENOUS | Status: DC
  Administered 2012-10-10 – 2012-10-12 (×4): 20 mg via INTRAVENOUS
  Filled 2012-10-10 (×5): qty 50

## 2012-10-10 NOTE — Progress Notes (Addendum)
Palliative Medicine Team Progress Note  Rachel Benton continues to have significant pain, she did not have a good night per family. Reviewed med administration record and maximal prn's not given as ordered for comfort. On current doses she is still alert, very drowsy-she thanks me again for helping her with pain and EOL issues. Her Renato Gails and family are at bedside. She asks me to get her more relief immediately.  Filed Vitals:   10/10/12 1320  BP: 109/70  Pulse: 113  Temp: 98.5 F (36.9 C)  Resp: 21   Drowsy but able to carry on a short conversation, actually better than when I saw her with very uncontrolled pain. Her breathing is shallow, she is very pale. Abdomen is softer, less tense than yesterday when getting tube feeding.  1. Intractable Pain, generalized-multiple areas-suspect post stroke pain/thalamic pain  Started Fentanyl Infusion at , bolus 50 mcg   Increased Valium dose and frequency 2. Terminal dysphagia: comfort feeding, minimal PO intake 3. Itching, secondary to opiates- ordered benedryl 4. Constipation: sorbitol worked great-she had a BM for the first time in a few days according to family. 5. Goals are still full comfort, per family she is complaining of being hungry but has thick mucous in her throat and cannot swallow-I am not opposed to giving a can of ensure through her PEG if it helps with this complaint-will see how she does over the next 24 hours.I discussed with nurse it would be ok to attempt to OT suction and if ok with patient to attempt NT suction if it does not make her uncomfortable, avoid vigorous suctioning. 6. Disposition: She is residential hospice appropriate if we can get her pain under better control so that she can tolerate transport without severe pain and distress.  15 minutes. Greater than 50%  of this time was spent counseling and coordinating care related to the above assessment and plan.  Anderson Malta, DO Palliative Medicine

## 2012-10-10 NOTE — Progress Notes (Signed)
[facility] PROGRESS NOTE  EMMERIE BATTAGLIA ZOX:[id number] DOB: 1940/08/10 DOA: 10/07/2012 PCP: Elby Showers, MD  Assessment/Plan: Now comfort care- appreciate palliative care  Acute encephalopathy  -Multifactorial including aspiration pneumonitis, opioids, and the possibility of new stroke.  -Repeat MRI: Small acute infarct in the right cerebellar hemisphere -CT brain in the ED suggested acute/subacute right inferior cerebellar infarct with evolution of her right MCA    DVT - comfort care  Fever   Dysphagia  -stop tube feeds  Right MCA infarct with cytotoxic cerebral edema and acute to subacute lacunar infarct in the posterior left MCA territory   DIABETES MELLITUS, TYPE II    HYPERLIPIDEMIA    ANXIETY, MILD   HYPERTENSION   Tachycardia/ocasional bursts of ST w/ frequent PAC's vs PAF   BACK PAIN, CHRONIC   Rectal cancer metastasized to intrapelvic lymph node   Protein-calorie malnutrition, severe     Poor overall prognosis   Code Status: DNR Family Communication: husband Disposition Plan:    Consultants:  Neuro  palliative care  Procedures:    Antibiotics:    HPI/Subjective: May need fentanyl gtt- requiring fentanyl q 1 hours  Objective: Filed Vitals:   10/10/12 0122  BP: 104/67  Pulse: 104  Temp: 98.2 F (36.8 C)  Resp: 20   No intake or output data in the 24 hours ending 10/10/12 0823 Filed Weights   10/08/12 1222  Weight: 67.8 kg (149 lb 7.6 oz)    Exam:  General: , NAD, nontoxic, awakens to voice, but somnolent. Does not follow commands.  CV: RRR, no rub, no gallop, no S3  Lung: diminished breath sounds left base. Right clear to auscultation. No wheezing.  Abdomen: soft/NT, +BS, nondistended, no peritoneal signs; gastrostomy tube site without erythema or induration  Ext:  1+LLE edema  Neuro: right facial droop, patient withdraws extremities to protopathic stimuli. Corneal reflex intact    Data Reviewed: Basic  Metabolic Panel:  Recent Labs Lab 10/07/12 1042 10/09/12 0630  NA 137 141  K 3.9 3.8  CL 97 102  CO2 30 30  GLUCOSE 281* 133*  BUN 19 18  CREATININE 0.21* 0.28*  CALCIUM 8.5 8.3*   Liver Function Tests:  Recent Labs Lab 10/07/12 1042  AST 33  ALT 13  ALKPHOS 547*  BILITOT 0.4  PROT 5.9*  ALBUMIN 1.8*   No results found for this basename: LIPASE, AMYLASE,  in the last 168 hours No results found for this basename: AMMONIA,  in the last 168 hours CBC:  Recent Labs Lab 10/07/12 1042 10/09/12 0630  WBC 7.3 5.5  HGB 8.4* 7.6*  HCT 28.0* 25.6*  MCV 85.1 85.9  PLT 254 225   Cardiac Enzymes: No results found for this basename: CKTOTAL, CKMB, CKMBINDEX, TROPONINI,  in the last 168 hours BNP (last 3 results) No results found for this basename: PROBNP,  in the last 8760 hours CBG:  Recent Labs Lab 10/08/12 2040 10/09/12 0042 10/09/12 0537 10/09/12 0826 10/09/12 1146  GLUCAP 170* 155* 132* 114* 94    Recent Results (from the past 240 hour(s))  CULTURE, BLOOD (ROUTINE X 2)     Status: None   Collection Time    10/07/12  3:50 PM      Result Value Range Status   Specimen Description BLOOD THUMB LEFT   Final   Special Requests BOTTLES DRAWN AEROBIC ONLY 5CC   Final   Culture  Setup Time     Final   Value: 10/07/2012 21:17  Performed at [facility]     Final   Value:        BLOOD CULTURE RECEIVED NO GROWTH TO DATE CULTURE WILL BE HELD FOR 5 DAYS BEFORE ISSUING A FINAL NEGATIVE REPORT     Performed at [facility]   Report Status PENDING   Incomplete  CULTURE, BLOOD (ROUTINE X 2)     Status: None   Collection Time    10/07/12  3:57 PM      Result Value Range Status   Specimen Description BLOOD ARM RIGHT   Final   Special Requests BOTTLES DRAWN AEROBIC AND ANAEROBIC 10CC   Final   Culture  Setup Time     Final   Value: 10/07/2012 21:17     Performed at [facility]   Culture     Final   Value:        BLOOD CULTURE  RECEIVED NO GROWTH TO DATE CULTURE WILL BE HELD FOR 5 DAYS BEFORE ISSUING A FINAL NEGATIVE REPORT     Performed at [facility]   Report Status PENDING   Incomplete     Studies: Mr Shirlee Latch Wo Contrast  10/08/2012   CLINICAL DATA:  72 year old female with right MCA infarct earlier this month. New abnormal area in the right cerebellum. Decreased level of consciousness.  EXAM: MRI HEAD WITHOUT CONTRAST  MRA HEAD WITHOUT CONTRAST  TECHNIQUE: Multiplanar, multiecho pulse sequences of the brain and surrounding structures were obtained without intravenous contrast. Angiographic images of the head were obtained using MRA technique without contrast.  COMPARISON:  Head CT without contrast 09/2012. Brain MRI 09/22/2012.  FINDINGS: MRI HEAD FINDINGS  Continued confluent restricted diffusion in the right MCA territory white matter, with interval more normalized diffusion in the right MCA cortex. Intermediate diffusion abnormality remains in the right Basil ganglia.  Corresponding to the finding on CT there is a new 12 mm focus of restricted diffusion in the right cerebellum adjacent to a chronic cerebellar lacunar infarct.  No associated mass effect or hemorrhage. Major intracranial vascular flow voids are stable. And Major intracranial vascular flow voids are stable.  Stable gray and white matter signal elsewhere. No other posterior fossa and no left hemisphere diffusion abnormality. Stable ventricle size and configuration.  Visualized orbit soft tissues are within normal limits. Mastoid effusions have mildly increased. Paranasal sinuses remain clear. Stable bone marrow signal. Negative scalp soft tissues.  MRA HEAD FINDINGS  Today's exam is mildly more degraded by motion/noise than the the recent comparison.  Stable antegrade flow in the posterior circulation. The distal right vertebral artery demonstrates antegrade flow and appears stable. The proximal right PICA and is patent and appears stable.  Stable  distal right vertebral artery, basilar artery, and bilateral PCA branches.  Stable antegrade flow in both ICA siphons. No ICA stenosis, and both carotid termini remain patent. Fetal type PCA origins again noted. Left MCA and bilateral ACA origins remain patent. Visualized bilateral ACA and left MCA branches appear stable.  The right MCA remains occluded just beyond its origin.  IMPRESSION: MRI HEAD IMPRESSION  1. Small acute infarct in the right cerebellar hemisphere corresponding to the CT finding. No mass effect or hemorrhage.  2. Continued white matter and to a lesser extent Basil ganglia restricted diffusion related to the recent moderate size right MCA infarct.  3. Stable brain otherwise.  MRA HEAD IMPRESSION  Stable intracranial MRA, with persistent right MCA occlusion. Stable appearance of the posterior  circulation, and the origin and proximal aspect of the right PICA remains patent.   Electronically Signed   By: Augusto Gamble   On: 10/08/2012 14:07   Mr Brain Wo Contrast  10/08/2012   CLINICAL DATA:  72 year old female with right MCA infarct earlier this month. New abnormal area in the right cerebellum. Decreased level of consciousness.  EXAM: MRI HEAD WITHOUT CONTRAST  MRA HEAD WITHOUT CONTRAST  TECHNIQUE: Multiplanar, multiecho pulse sequences of the brain and surrounding structures were obtained without intravenous contrast. Angiographic images of the head were obtained using MRA technique without contrast.  COMPARISON:  Head CT without contrast 09/2012. Brain MRI 09/22/2012.  FINDINGS: MRI HEAD FINDINGS  Continued confluent restricted diffusion in the right MCA territory white matter, with interval more normalized diffusion in the right MCA cortex. Intermediate diffusion abnormality remains in the right Basil ganglia.  Corresponding to the finding on CT there is a new 12 mm focus of restricted diffusion in the right cerebellum adjacent to a chronic cerebellar lacunar infarct.  No associated mass effect or  hemorrhage. Major intracranial vascular flow voids are stable. And Major intracranial vascular flow voids are stable.  Stable gray and white matter signal elsewhere. No other posterior fossa and no left hemisphere diffusion abnormality. Stable ventricle size and configuration.  Visualized orbit soft tissues are within normal limits. Mastoid effusions have mildly increased. Paranasal sinuses remain clear. Stable bone marrow signal. Negative scalp soft tissues.  MRA HEAD FINDINGS  Today's exam is mildly more degraded by motion/noise than the the recent comparison.  Stable antegrade flow in the posterior circulation. The distal right vertebral artery demonstrates antegrade flow and appears stable. The proximal right PICA and is patent and appears stable.  Stable distal right vertebral artery, basilar artery, and bilateral PCA branches.  Stable antegrade flow in both ICA siphons. No ICA stenosis, and both carotid termini remain patent. Fetal type PCA origins again noted. Left MCA and bilateral ACA origins remain patent. Visualized bilateral ACA and left MCA branches appear stable.  The right MCA remains occluded just beyond its origin.  IMPRESSION: MRI HEAD IMPRESSION  1. Small acute infarct in the right cerebellar hemisphere corresponding to the CT finding. No mass effect or hemorrhage.  2. Continued white matter and to a lesser extent Basil ganglia restricted diffusion related to the recent moderate size right MCA infarct.  3. Stable brain otherwise.  MRA HEAD IMPRESSION  Stable intracranial MRA, with persistent right MCA occlusion. Stable appearance of the posterior circulation, and the origin and proximal aspect of the right PICA remains patent.   Electronically Signed   By: Augusto Gamble   On: 10/08/2012 14:07    Scheduled Meds: . chlorhexidine  15 mL Mouth/Throat BID  . enoxaparin (LOVENOX) injection  40 mg Subcutaneous Q24H  . fentaNYL  25 mcg Transdermal Q72H  . sorbitol  30 mL Per Tube Daily   Continuous  Infusions:    Active Problems:   DIABETES MELLITUS, TYPE II   BACK PAIN, CHRONIC   Aspiration pneumonitis   Acute encephalopathy   Cerebellar stroke, acute   CVA (cerebral infarction)    Time spent: 25    Marlin Canary  [facility] Pager [phone]. If 7PM-7AM, please contact night-coverage at [email or web address], password [facility] 10/10/2012, 8:23 AM  LOS: 3 days

## 2012-10-10 NOTE — Progress Notes (Signed)
Wasted with Jacklyn Shell RN Fentanyl out of vial (in sink), had some issues with wasting on pyxis. Pharmacist notified.

## 2012-10-11 DIAGNOSIS — M549 Dorsalgia, unspecified: Secondary | ICD-10-CM

## 2012-10-11 DIAGNOSIS — R209 Unspecified disturbances of skin sensation: Secondary | ICD-10-CM

## 2012-10-11 NOTE — Progress Notes (Signed)
[facility] PROGRESS NOTE  Rachel Benton UJW:[id number] DOB: 01-13-1941 DOA: 10/07/2012 PCP: Elby Showers, MD  Assessment/Plan: Now comfort care- appreciate palliative care  Acute encephalopathy  -Multifactorial including aspiration pneumonitis, opioids, and the possibility of new stroke.  -Repeat MRI: Small acute infarct in the right cerebellar hemisphere -CT brain in the ED suggested acute/subacute right inferior cerebellar infarct with evolution of her right MCA   DVT - comfort care  Fever   Dysphagia  -stop tube feeds  Right MCA infarct with cytotoxic cerebral edema and acute to subacute lacunar infarct in the posterior left MCA territory   DIABETES MELLITUS, TYPE II    HYPERLIPIDEMIA    ANXIETY, MILD   HYPERTENSION   Tachycardia/ocasional bursts of ST w/ frequent PAC's vs PAF   BACK PAIN, CHRONIC   Rectal cancer metastasized to intrapelvic lymph node   Protein-calorie malnutrition, severe     Poor overall prognosis   Code Status: DNR Family Communication: husband Disposition Plan: inpt hospice   Consultants:  Neuro  palliative care  Procedures:    Antibiotics:    HPI/Subjective: Appears comfortable  Objective: Filed Vitals:   10/11/12 0543  BP: 115/75  Pulse: 96  Temp: 98.5 F (36.9 C)  Resp: 20    Intake/Output Summary (Last 24 hours) at 10/11/12 0834 Last data filed at 10/11/12 0546  Gross per 24 hour  Intake      0 ml  Output     75 ml  Net    -75 ml   Filed Weights   10/08/12 1222  Weight: 67.8 kg (149 lb 7.6 oz)    Exam:  General: , NAD, nontoxic, awakens to voice, but somnolent. Does not follow commands.  CV: RRR, no rub, no gallop, no S3  Lung: diminished breath sounds left base. Right clear to auscultation. No wheezing.  Abdomen: soft/NT, +BS, nondistended, no peritoneal signs; gastrostomy tube site without erythema or induration  Ext:  1+LLE edema  Neuro: right facial droop, patient withdraws  extremities to protopathic stimuli. Corneal reflex intact    Data Reviewed: Basic Metabolic Panel:  Recent Labs Lab 10/07/12 1042 10/09/12 0630  NA 137 141  K 3.9 3.8  CL 97 102  CO2 30 30  GLUCOSE 281* 133*  BUN 19 18  CREATININE 0.21* 0.28*  CALCIUM 8.5 8.3*   Liver Function Tests:  Recent Labs Lab 10/07/12 1042  AST 33  ALT 13  ALKPHOS 547*  BILITOT 0.4  PROT 5.9*  ALBUMIN 1.8*   No results found for this basename: LIPASE, AMYLASE,  in the last 168 hours No results found for this basename: AMMONIA,  in the last 168 hours CBC:  Recent Labs Lab 10/07/12 1042 10/09/12 0630  WBC 7.3 5.5  HGB 8.4* 7.6*  HCT 28.0* 25.6*  MCV 85.1 85.9  PLT 254 225   Cardiac Enzymes: No results found for this basename: CKTOTAL, CKMB, CKMBINDEX, TROPONINI,  in the last 168 hours BNP (last 3 results) No results found for this basename: PROBNP,  in the last 8760 hours CBG:  Recent Labs Lab 10/08/12 2040 10/09/12 0042 10/09/12 0537 10/09/12 0826 10/09/12 1146  GLUCAP 170* 155* 132* 114* 94    Recent Results (from the past 240 hour(s))  CULTURE, BLOOD (ROUTINE X 2)     Status: None   Collection Time    10/07/12  3:50 PM      Result Value Range Status   Specimen Description BLOOD THUMB LEFT   Final   Special Requests BOTTLES  DRAWN AEROBIC ONLY 5CC   Final   Culture  Setup Time     Final   Value: 10/07/2012 21:17     Performed at [facility]   Culture     Final   Value:        BLOOD CULTURE RECEIVED NO GROWTH TO DATE CULTURE WILL BE HELD FOR 5 DAYS BEFORE ISSUING A FINAL NEGATIVE REPORT     Performed at [facility]   Report Status PENDING   Incomplete  CULTURE, BLOOD (ROUTINE X 2)     Status: None   Collection Time    10/07/12  3:57 PM      Result Value Range Status   Specimen Description BLOOD ARM RIGHT   Final   Special Requests BOTTLES DRAWN AEROBIC AND ANAEROBIC 10CC   Final   Culture  Setup Time     Final   Value: 10/07/2012 21:17      Performed at [facility]   Culture     Final   Value:        BLOOD CULTURE RECEIVED NO GROWTH TO DATE CULTURE WILL BE HELD FOR 5 DAYS BEFORE ISSUING A FINAL NEGATIVE REPORT     Performed at [facility]   Report Status PENDING   Incomplete     Studies: No results found.  Scheduled Meds: . chlorhexidine  15 mL Mouth/Throat BID  . enoxaparin (LOVENOX) injection  40 mg Subcutaneous Q24H  . famotidine (PEPCID) IV  20 mg Intravenous Q12H  . sorbitol  30 mL Per Tube Daily   Continuous Infusions: . fentaNYL infusion INTRAVENOUS 20 mcg/hr (10/10/12 1616)    Active Problems:   DIABETES MELLITUS, TYPE II   BACK PAIN, CHRONIC   Aspiration pneumonitis   Acute encephalopathy   Cerebellar stroke, acute   CVA (cerebral infarction)    Time spent: 25    [facility], Rachel Benton  [facility] Pager [phone]. If 7PM-7AM, please contact night-coverage at [email or web address], password [facility] 10/11/2012, 8:34 AM  LOS: 4 days

## 2012-10-11 NOTE — Progress Notes (Signed)
[name]      DOB: February 15, 1941      AVW:[id number]   Palliative Medicine Team at [facility] Progress Note    Subjective: Patient remains intermittently arousable communicating with her spouse. She states she is not in pain but continues to aspirate little pieces of ice chip. Her husband was tearful and was supported at the bedside. Her daughters are not present at this time. Nursing reports that the patient has appeared comfortable.     Filed Vitals:   10/11/12 1036  BP: 134/65  Pulse: 96  Temp: 100.7 F (38.2 C)  Resp: 20   Physical exam:  Gen. intermittently somnolent, not oriented at baseline Pupils are equal round and reactive to light extraocular muscles appear to be intact mucous membranes are dry Chest is decreased with basilar crackles Cardiovascular regular rate and rhythm positive S1 and S2 don't appreciate S3 or S4 Abdomen is obese soft nontender nondistended with positive bowel sounds Extremities are warm and not mottled, trace to 1+ edema is present neurologically patient is not oriented to time place but is oriented to person. She permits me to touch her extremities without difficulty.  Lab Results  Component Value Date   CREATININE 0.28* 10/09/2012   BUN 18 10/09/2012   NA 141 10/09/2012   K 3.8 10/09/2012   CL 102 10/09/2012   CO2 30 10/09/2012     Assessment and plan: Patient is a 72 year old white female presented from a skilled nursing facility with uncontrolled pain x70 week. Her family has elected comfort care in light of her significant medical history for rectal cancer and the posterior stroke pain. The patient has tolerated a low dose fentanyl drip for allodynia as well as post stroke thalamic pain. She is intolerant of Dilaudid and morphine which cause intense itching. Her family has discussed possible residential hospice placement.   1. DO NOT RESUSCITATE DO NOT INTUBATE 2. Comfort feeding by mouth dysphagia 1 with ice chips for  comfort. 3. Pain. Continue her fentanyl drip at current rate with when necessary boluses. Please note the patient is intolerant of Dilaudid and morphine. 4. Rectal cancer. Patient had been on xeloda but family now pursuing full comfort care. 5. CVA: Continue to feed cautiously with aspiration precautions. Continue treatment for thalamic post stroke pain. 6. Muscle spasm/ anxiety. Continue Valium as needed  Discussed case with patient's spouse at bedside we will attempt to communicate with her daughter.    Rachel Sturgeon L. Ladona Ridgel, MD MBA The Palliative Medicine Team at [facility] Phone: [phone] Pager: [phone]

## 2012-10-12 MED ORDER — FAMOTIDINE 40 MG/5ML PO SUSR
20.0000 mg | Freq: Two times a day (BID) | ORAL | Status: DC
  Administered 2012-10-12 – 2012-10-14 (×4): 20 mg
  Filled 2012-10-12 (×5): qty 2.5

## 2012-10-12 NOTE — Progress Notes (Signed)
Fentanyl bag changed due to bag expiration (10/12/2012 @ 1200.) Remainder of expired bag wasted in sink with Foye Clock, RN witnessing.

## 2012-10-12 NOTE — Progress Notes (Signed)
[facility] PROGRESS NOTE  Rachel Benton ZOX:[id number] DOB: [birth year] DOA: 10/07/2012 PCP: Elby Showers, MD  Assessment/Plan: Now comfort care- appreciate palliative care  Acute encephalopathy  -Multifactorial including aspiration pneumonitis, opioids, and the possibility of new stroke.  -Repeat MRI: Small acute infarct in the right cerebellar hemisphere -CT brain in the ED suggested acute/subacute right inferior cerebellar infarct with evolution of her right MCA   DVT - comfort care  Fever   Dysphagia  -stop tube feeds  Right MCA infarct with cytotoxic cerebral edema and acute to subacute lacunar infarct in the posterior left MCA territory   DIABETES MELLITUS, TYPE II    HYPERLIPIDEMIA    ANXIETY, MILD   HYPERTENSION   Tachycardia/ocasional bursts of ST w/ frequent PAC's vs PAF   BACK PAIN, CHRONIC   Rectal cancer metastasized to intrapelvic lymph node   Protein-calorie malnutrition, severe     Poor overall prognosis   Code Status: DNR Family Communication: husband Disposition Plan: inpt hospice   Consultants:  Neuro  palliative care  Procedures:    Antibiotics:    HPI/Subjective: Appears comfortable  Objective: Filed Vitals:   10/12/12 0613  BP: 120/65  Pulse: 98  Temp: 100.6 F (38.1 C)  Resp: 16   No intake or output data in the 24 hours ending 10/12/12 0855 Filed Weights   10/08/12 1222  Weight: 67.8 kg (149 lb 7.6 oz)    Exam:  General: , NAD, nontoxic, awakens to voice, but somnolent. Does not follow commands.  CV: RRR, no rub, no gallop, no S3  Lung: diminished breath sounds left base. Right clear to auscultation. No wheezing.  Abdomen: soft/NT, +BS, nondistended, no peritoneal signs; gastrostomy tube site without erythema or induration  Ext:  edema     Data Reviewed: Basic Metabolic Panel:  Recent Labs Lab 10/07/12 1042 10/09/12 0630  NA 137 141  K 3.9 3.8  CL 97 102  CO2 30 30  GLUCOSE 281* 133*   BUN 19 18  CREATININE 0.21* 0.28*  CALCIUM 8.5 8.3*   Liver Function Tests:  Recent Labs Lab 10/07/12 1042  AST 33  ALT 13  ALKPHOS 547*  BILITOT 0.4  PROT 5.9*  ALBUMIN 1.8*   No results found for this basename: LIPASE, AMYLASE,  in the last 168 hours No results found for this basename: AMMONIA,  in the last 168 hours CBC:  Recent Labs Lab 10/07/12 1042 10/09/12 0630  WBC 7.3 5.5  HGB 8.4* 7.6*  HCT 28.0* 25.6*  MCV 85.1 85.9  PLT 254 225   Cardiac Enzymes: No results found for this basename: CKTOTAL, CKMB, CKMBINDEX, TROPONINI,  in the last 168 hours BNP (last 3 results) No results found for this basename: PROBNP,  in the last 8760 hours CBG:  Recent Labs Lab 10/08/12 2040 10/09/12 0042 10/09/12 0537 10/09/12 0826 10/09/12 1146  GLUCAP 170* 155* 132* 114* 94    Recent Results (from the past 240 hour(s))  CULTURE, BLOOD (ROUTINE X 2)     Status: None   Collection Time    10/07/12  3:50 PM      Result Value Range Status   Specimen Description BLOOD THUMB LEFT   Final   Special Requests BOTTLES DRAWN AEROBIC ONLY 5CC   Final   Culture  Setup Time     Final   Value: 10/07/2012 21:17     Performed at [facility]   Culture     Final   Value:  BLOOD CULTURE RECEIVED NO GROWTH TO DATE CULTURE WILL BE HELD FOR 5 DAYS BEFORE ISSUING A FINAL NEGATIVE REPORT     Performed at [facility]   Report Status PENDING   Incomplete  CULTURE, BLOOD (ROUTINE X 2)     Status: None   Collection Time    10/07/12  3:57 PM      Result Value Range Status   Specimen Description BLOOD ARM RIGHT   Final   Special Requests BOTTLES DRAWN AEROBIC AND ANAEROBIC 10CC   Final   Culture  Setup Time     Final   Value: 10/07/2012 21:17     Performed at [facility]   Culture     Final   Value:        BLOOD CULTURE RECEIVED NO GROWTH TO DATE CULTURE WILL BE HELD FOR 5 DAYS BEFORE ISSUING A FINAL NEGATIVE REPORT     Performed at [facility]    Report Status PENDING   Incomplete     Studies: No results found.  Scheduled Meds: . chlorhexidine  15 mL Mouth/Throat BID  . famotidine (PEPCID) IV  20 mg Intravenous Q12H  . sorbitol  30 mL Per Tube Daily   Continuous Infusions: . fentaNYL infusion INTRAVENOUS 20 mcg/hr (10/10/12 1616)    Active Problems:   DIABETES MELLITUS, TYPE II   BACK PAIN, CHRONIC   Aspiration pneumonitis   Acute encephalopathy   Cerebellar stroke, acute   CVA (cerebral infarction)    Time spent: 25    [facility], Rachel Benton  [facility] Pager [phone]. If 7PM-7AM, please contact night-coverage at [email or web address], password [facility] 10/12/2012, 8:55 AM  LOS: 5 days

## 2012-10-12 NOTE — Progress Notes (Signed)
Nutrition Brief Note  Chart reviewed. Pt now transitioning to comfort care. Enteral nutrition has been d/c'd and pt taking comfort feeds only.  No further nutrition interventions warranted at this time.  Please re-consult as needed.   Clarene Duke RD, LDN Pager [phone] After Hours pager [phone]

## 2012-10-13 DIAGNOSIS — R5381 Other malaise: Secondary | ICD-10-CM

## 2012-10-13 LAB — CULTURE, BLOOD (ROUTINE X 2)
Culture: NO GROWTH
Culture: NO GROWTH

## 2012-10-13 MED ORDER — DIAZEPAM 5 MG/ML IJ SOLN
5.0000 mg | INTRAMUSCULAR | Status: DC
  Administered 2012-10-13 – 2012-10-14 (×9): 5 mg via INTRAVENOUS
  Filled 2012-10-13 (×9): qty 2

## 2012-10-13 MED ORDER — DIPHENHYDRAMINE HCL 50 MG/ML IJ SOLN
25.0000 mg | Freq: Four times a day (QID) | INTRAMUSCULAR | Status: DC | PRN
Start: 2012-10-13 — End: 2012-10-14
  Administered 2012-10-13: 25 mg via INTRAVENOUS
  Filled 2012-10-13: qty 1

## 2012-10-13 NOTE — Progress Notes (Signed)
Patient JX:BJYNW [name]      DOB: [birth year]      GNF:[id number]   Palliative Medicine Team at [facility] Progress Note    Subjective: Patient is sleeping soundly without distress at this time.  Daughter related finally got settled around 5 am.  Family preparing themselves for the new changes of needing more medication and sleeping more.Family expressed did not want foley changed, has been leaking.  Filed Vitals:   10/13/12 1520  BP:   Pulse:   Temp: 99.5 F (37.5 C)  Resp:    Physical exam:  General:  No acute distress . 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 Has periods of lucidity which alternate with somnolence. Primary diagnosis is Rectal cancer. Family has elected comfort care. Tube feedings have been stopped and the patient has required a fentanyl drip with prn valium. Valium has been the most helpful with the "nightmares" per her daughter.  1. DNR/DNI  2. Fever: ok to use tylenol prn  3. Comfort feeding while awake. No further tube feedings  4. Pain: continue Fentanyl drip with boluses q 30 min. May need to increase basilar rate and adjust prns to q 15 minutes if symptoms warrant.  5. Anxiety/Agitation: Scheduled  valium much more comfortable from an agitation standpoint. Can consider low dose thorazine if agitation persists as terminal delirium is not uncommon.Will try to address her existential issues as well, limiting factor is her ability to participate.  Family support is most important in helping them interpret current changes. 6. Itching: low dose benadryl. Can  be uptitrated if needed.  Residential Hospice Evaluation has been requested and I have spoken with the social worker.    Total time 15 min  Rachel Twist L. Ladona Ridgel, MD MBA The Palliative Medicine Team at [facility] Phone: [phone] Pager: [phone]

## 2012-10-13 NOTE — Progress Notes (Signed)
Palliative Medicine Team SW Referred for psychosocial support. No family at bedside upon visit. Pt sleeping comfortably, soundly. Note order for residential hospice referral, will follow up as time allows.   Kennieth Francois, LCSW PMT Phone [phone] Pager [phone]

## 2012-10-13 NOTE — Progress Notes (Signed)
[name]      DOB: [birth year]      AVW:[id number]   Palliative Medicine Team at [facility] Progress Note    Subjective: Patient is sleeping soundly daughters Rachel Benton and Rachel Benton at the bedside.  Patient will intermittently vocalize slightly but generally is not responsive to even tactile stimuli.  Family able to stroke arm without causing her pain or discomfort.  Tried to help answer multiple question: why is urine so dark, should we test for UTI and treat , why is she having nightmares- make them stop,  Her vitals are so good is she really dying....etc.  Family very anxious.  Daughters relate they are interested in residential referral.    Filed Vitals:   10/12/12 2200  BP: 120/80  Pulse: 102  Temp: 97.8 F (36.6 C)  Resp: 18   Physical exam:   Full exam deferred as patient is sleeping soundly.  General exam reveals easy respirations, no apnea, no mottling. No audible crackles or breath sounds.   Assessment and plan: 72 yr old white female with post stroke thalamic pain superimposed with existential pain (patient reporting to daughters fear about not being "let in", no being able to get "out of the box".  Has periods of lucidity which alternate with somnolence. Primary diagnosis is Rectal cancer.  Family has elected comfort care.  Tube feedings have been stopped and the patient has required a fentanyl drip with prn valium.  Valium has been the most helpful with the "nightmares" per her daughter.   1.  DNR/DNI  2. Fever: ok to use tylenol prn  3. Comfort feeding while awake.  No further tube feedings  4.  Pain: continue Fentanyl drip with boluses q 30 min. May need to increase basilar rate and adjust prns to q 15 minutes if symptoms warrant.  5.  Anxiety/Agitation:  Consider scheduling valium if requiring prns frequently.  Can consider low dose thorazine if agitation persists as terminal delirium is not uncommon.Will try to address her existential issues as  well.  6. Itching: low dose benadryl.  Can be uptitrated if needed.   Total time with patient and family 35 min.  Counseling and coordination of care was greater than 50% related to above topics.   Rachel Fout L. Ladona Ridgel, MD MBA The Palliative Medicine Team at [facility] Phone: [phone] Pager: [phone]

## 2012-10-13 NOTE — Progress Notes (Signed)
[facility] PROGRESS NOTE  Rachel Benton ZOX:[id number] DOB: 02-Dec-1940 DOA: 10/07/2012 PCP: Elby Showers, MD  Assessment/Plan: Now comfort care- appreciate palliative care  Acute encephalopathy  -Multifactorial including aspiration pneumonitis, opioids, and the possibility of new stroke.  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 Does not follow commands.  CV: RRR, no rub, no gallop, no S3  Lung: diminished breath sounds left base. Right clear to auscultation. No wheezing.  Abdomen: soft/NT, +BS, nondistended, no peritoneal signs; gastrostomy tube site without erythema or induration  Ext:  edema     Data Reviewed: Basic Metabolic Panel:  Recent Labs Lab 10/07/12 1042 10/09/12 0630  NA 137 141  K 3.9 3.8  CL 97 102  CO2 30 30  GLUCOSE 281* 133*   BUN 19 18  CREATININE 0.21* 0.28*  CALCIUM 8.5 8.3*   Liver Function Tests:  Recent Labs Lab 10/07/12 1042  AST 33  ALT 13  ALKPHOS 547*  BILITOT 0.4  PROT 5.9*  ALBUMIN 1.8*   No results found for this basename: LIPASE, AMYLASE,  in the last 168 hours No results found for this basename: AMMONIA,  in the last 168 hours CBC:  Recent Labs Lab 10/07/12 1042 10/09/12 0630  WBC 7.3 5.5  HGB 8.4* 7.6*  HCT 28.0* 25.6*  MCV 85.1 85.9  PLT 254 225   Cardiac Enzymes: No results found for this basename: CKTOTAL, CKMB, CKMBINDEX, TROPONINI,  in the last 168 hours BNP (last 3 results) No results found for this basename: PROBNP,  in the last 8760 hours CBG:  Recent Labs Lab 10/08/12 2040 10/09/12 0042 10/09/12 0537 10/09/12 0826 10/09/12 1146  GLUCAP 170* 155* 132* 114* 94    Recent Results (from the past 240 hour(s))  CULTURE, BLOOD (ROUTINE X 2)     Status: None   Collection Time    10/07/12  3:50 PM      Result Value Range Status   Specimen Description BLOOD THUMB LEFT   Final   Special Requests BOTTLES DRAWN AEROBIC ONLY 5CC   Final   Culture  Setup Time     Final   Value: 10/07/2012 21:17     Performed at [facility]   Culture     Final   Value: NO GROWTH 5 DAYS  Performed at [facility]   Report Status 10/13/2012 FINAL   Final  CULTURE, BLOOD (ROUTINE X 2)     Status: None   Collection Time    10/07/12  3:57 PM      Result Value Range Status   Specimen Description BLOOD ARM RIGHT   Final   Special Requests BOTTLES DRAWN AEROBIC AND ANAEROBIC 10CC   Final   Culture  Setup Time     Final   Value: 10/07/2012 21:17     Performed at [facility]   Culture     Final   Value: NO GROWTH 5 DAYS     Performed at [facility]   Report Status 10/13/2012 FINAL   Final     Studies: No results found.  Scheduled Meds: . chlorhexidine  15 mL Mouth/Throat BID  . diazepam  5 mg Intravenous Q4H  . famotidine  20 mg Per Tube  BID  . sorbitol  30 mL Per Tube Daily   Continuous Infusions: . fentaNYL infusion INTRAVENOUS 20 mcg/hr (10/12/12 1225)    Active Problems:   DIABETES MELLITUS, TYPE II   BACK PAIN, CHRONIC   Aspiration pneumonitis   Acute encephalopathy   Cerebellar stroke, acute   CVA (cerebral infarction)    Time spent: 25    [facility], Hera Celaya  [facility] Pager [phone]. If 7PM-7AM, please contact night-coverage at [email or web address], password [facility] 10/13/2012, 8:39 AM  LOS: 6 days

## 2012-10-13 NOTE — Progress Notes (Addendum)
Patient ZO:XWRUE J Hunsinger      DOB: 31-May-1940      AVW:[id number]  Called by nursing for exacerbation of pain and itching.  Reviewed medications and made adjustments to dosing. Benadryl dose increased.  Nursing will call back if unable to regain control of pain.  May need to increase basilar rate of fentanyl of boluses in effective.  Nursing also to check catheter and determine if needs to be changed as it is leaking.  Explained adjustments to family by phone.  Will continue to monitor with nursing.  Beryl Hornberger L. Ladona Ridgel, MD MBA The Palliative Medicine Team at [facility] Phone: [phone] Pager: [phone]

## 2012-10-13 NOTE — Progress Notes (Signed)
100 mcg fentanyl from vial wasted with Michelene Gardener, RN, as pyxis would not accept vial back. Morrie Sheldon Minor RN

## 2012-10-14 ENCOUNTER — Telehealth: Payer: Self-pay | Admitting: Oncology

## 2012-10-14 MED ORDER — ONDANSETRON HCL 4 MG/2ML IJ SOLN: 4.0000 mg | INTRAMUSCULAR | Status: AC | PRN

## 2012-10-14 MED ORDER — FENTANYL CITRATE 0.05 MG/ML IJ SOLN: 50.0000 ug | INTRAMUSCULAR | Status: AC | PRN

## 2012-10-14 MED ORDER — DIPHENHYDRAMINE HCL 50 MG/ML IJ SOLN: 25.0000 mg | Freq: Four times a day (QID) | INTRAMUSCULAR | Status: AC | PRN

## 2012-10-14 MED ORDER — SODIUM CHLORIDE 0.9 % IJ SOLN: 10.0000 mL | INTRAMUSCULAR | Status: AC | PRN

## 2012-10-14 MED ORDER — SODIUM CHLORIDE 0.9 % IJ SOLN: 10.0000 mL | INTRAMUSCULAR | Status: DC | PRN

## 2012-10-14 MED ORDER — SODIUM CHLORIDE 0.9 % IV SOLN: 20.0000 ug/h | INTRAVENOUS | Status: AC

## 2012-10-14 MED ORDER — DIAZEPAM 5 MG/ML IJ SOLN: 5.0000 mg | INTRAMUSCULAR | Status: AC | PRN

## 2012-10-14 NOTE — Discharge Summary (Signed)
[facility] DISCHARGE SUMMARY  Rachel Benton ZOX:[id number] DOB: 19-Nov-1940 DOA: 10/07/2012 PCP: Elby Showers, MD  Assessment/Plan: Now comfort care- appreciate palliative care  Acute encephalopathy  -Multifactorial including aspiration pneumonitis, opioids, and Small acute infarct in the right cerebellar   Hemisphere. Patient is awaiting placement in [facility] for Hosp[facility]. 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 Small acute infarct in the right cerebellar hemisphere  corresponding to the CT finding. No mass effect or hemorrhage.  2. Continued white matter and to a lesser extent Basil ganglia  restricted diffusion related to the recent moderate size right MCA  infarct.  3. Stable brain otherwise.  MRA HEAD IMPRESSION  Stable intracranial MRA, with persistent right MCA occlusion. Stable  appearance of the posterior circulation, and the origin and proximal  aspect of the right PICA remains patent.      Antibiotics:    HPI/Subjective: 72 y.o. female, with recent diagnosis of rectal cancer with rectal bleeding under the care of Dr. Truett Perna and Dr. 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 The patient was discharged to [facility] with TF via G-tube. The patient was found to be confused and agitated last night and this morning. Apparently the patient was also noted to be somewhat tachycardic. As a result, the patient was transferred to the ED for further evaluation.  The patient is currently encephalopathic. She is unable to provide any history. All of this history is obtained from the patient's daughter at the bedside. The patient had an episode of emesis approximately 2-3 days ago. In addition, the patient has not had a bowel movement for approximately 5 days. The patient's family notes that she has been complaining of persistent right shoulder and right back pain although they cannot clarify for me exactly where in the back it was bothering her.  In the emergency department, the patient was noted to have a fever 101.4F with tachycardia with heart rate of 105. She was hemodynamically stable. She had oxygen saturation 93-94% on room air. The patient was given fentanyl and as a result she is somnolent at the time of my examination. The patient's family relates that the patient has been agitated because of right shoulder and back pain which she was able to verbalize last night. TODAY have been informed that family wants no further treatment, the CSW Carley Hammed from [facility] will facilitate Pt departure to facility today     Objective: Filed Vitals:   10/14/12 1028  BP: 113/69  Pulse: 100  Temp:  97.2 F (36.2 C)  Resp: 18    Intake/Output Summary (Last 24 hours) at 10/14/12 1303 Last data filed at 10/14/12 1038  Gross per 24 hour  Intake    240 ml  Output    100 ml  Net    140 ml   Filed Weights   10/08/12 1222  Weight: 67.8 kg (149 lb 7.6 oz)        Data Reviewed: Basic Metabolic Panel:  Recent Labs Lab 10/09/12 0630  NA 141  K 3.8  CL 102  CO2 30  GLUCOSE 133*  BUN 18  CREATININE 0.28*  CALCIUM 8.3*   Liver Function Tests: No results found for this basename: AST, ALT, ALKPHOS, BILITOT, PROT, ALBUMIN,  in the last 168 hours No results found for this basename: LIPASE, AMYLASE,  in the last 168 hours No results found for this basename: AMMONIA,  in the last 168 hours CBC:  Recent Labs Lab 10/09/12 0630  WBC 5.5  HGB 7.6*  HCT 25.6*  MCV 85.9  PLT 225   Cardiac Enzymes: No results found for this basename: CKTOTAL, CKMB, CKMBINDEX, TROPONINI,  in the last 168 hours BNP (last 3 results) No results found for this basename: PROBNP,  in the last 8760 hours CBG:  Recent Labs Lab 10/08/12 2040 10/09/12 0042 10/09/12 0537 10/09/12 0826 10/09/12 1146  GLUCAP 170* 155* 132* 114* 94    Recent Results (from the past 240 hour(s))  CULTURE, BLOOD (ROUTINE X 2)     Status: None   Collection Time    10/07/12  3:50 PM      Result Value Range Status   Specimen Description BLOOD THUMB LEFT   Final   Special Requests BOTTLES DRAWN AEROBIC ONLY 5CC   Final   Culture  Setup Time     Final   Value: 10/07/2012 21:17     Performed at [facility]   Culture     Final   Value: NO GROWTH 5 DAYS     Performed at [facility]   Report Status 10/13/2012 FINAL   Final  CULTURE, BLOOD (ROUTINE X 2)     Status: None   Collection Time    10/07/12  3:57 PM      Result Value Range Status   Specimen Description BLOOD ARM RIGHT   Final   Special Requests BOTTLES DRAWN AEROBIC AND ANAEROBIC 10CC   Final   Culture  Setup Time     Final   Value: 10/07/2012 21:17     Performed at [facility]   Culture     Final   Value: NO GROWTH 5 DAYS     Performed at [facility]   Report Status 10/13/2012 FINAL   Final     Studies: No results found.  Scheduled Meds: . chlorhexidine  15 mL  Mouth/Throat BID  . diazepam  5 mg Intravenous Q4H  . famotidine  20 mg Per Tube BID  . sorbitol  30 mL Per Tube Daily   Continuous Infusions: . fentaNYL infusion INTRAVENOUS 20 mcg/hr (10/12/12 1225)    Active Problems:   DIABETES MELLITUS, TYPE II   BACK PAIN, CHRONIC   Aspiration pneumonitis   Acute encephalopathy   Cerebellar stroke, acute   CVA (cerebral infarction)    Time spent: 25    Drema Dallas  [facility] Pager [phone]. If 7PM-7AM, please contact night-coverage at [email or web address], password [facility] 10/14/2012, 1:03 PM  LOS: 7 days

## 2012-10-14 NOTE — Clinical Social Work Note (Signed)
CSW met with pt and pt's family at bedside. CSW offered the family support and condolences. CSW provided family with a list of Residential Hospice Facilities. Pt's daughter stated that the family would prefer [facility] for residential hospice. [facility] representative has been notified and will be meeting with the family. CSW to continue to follow and assist with pt discharge planning needs.  Darlyn Chamber, MSW, LCSWA Clinical Social Work [phone]

## 2012-10-14 NOTE — Progress Notes (Signed)
Discharge summary and prescription given to [facility] from [facility] per Case Management.  Sim Boast, RN 10/14/12 1443

## 2012-10-14 NOTE — Progress Notes (Signed)
Peripherally Inserted Central Catheter/Midline Placement  The IV Nurse has discussed with the patient and/or persons authorized to consent for the patient, the purpose of this procedure and the potential benefits and risks involved with this procedure.  The benefits include less needle sticks, lab draws from the catheter and patient may be discharged home with the catheter.  Risks include, but not limited to, infection, bleeding, blood clot (thrombus formation), and puncture of an artery; nerve damage and irregular heat beat.  Alternatives to this procedure were also discussed.  PICC/Midline Placement Documentation  PICC / Midline Double Lumen 10/14/12 PICC Right Brachial (Active)  Dressing Change Due 10/21/12 10/14/2012  2:00 PM       Stacie Glaze Horton 10/14/2012, 2:07 PM

## 2012-10-14 NOTE — Progress Notes (Signed)
Report given to [facility] at [facility] at [phone].   Sim Boast, RN 10/14/12 1423

## 2012-10-14 NOTE — Telephone Encounter (Signed)
pt had a stroke and dying per daughter, nurse notified

## 2012-10-15 ENCOUNTER — Ambulatory Visit: Payer: Medicare Other | Admitting: Radiation Oncology

## 2012-10-19 DEATH — deceased

## 2012-10-20 ENCOUNTER — Encounter: Payer: Self-pay | Admitting: Oncology

## 2012-10-20 NOTE — Progress Notes (Signed)
Per Dr. Truett Perna the patient is now deceased. I faxed form to [facility] to advise.

## 2012-10-20 NOTE — Progress Notes (Signed)
Per Dr. Truett Perna the patient is deceased. I let [facility] know and will forward to medical records

## 2012-11-06 ENCOUNTER — Ambulatory Visit: Payer: Medicare Other | Admitting: Oncology

## 2014-04-24 IMAGING — CR DG SHOULDER 2+V*R*
2 series · 2 of 2 positions shown · non-contrast
Comparison: None.

CLINICAL DATA: Right shoulder pain.  Metastatic rectal cancer.

RIGHT SHOULDER - 2+ VIEW

[t shoulder ap internal righ]
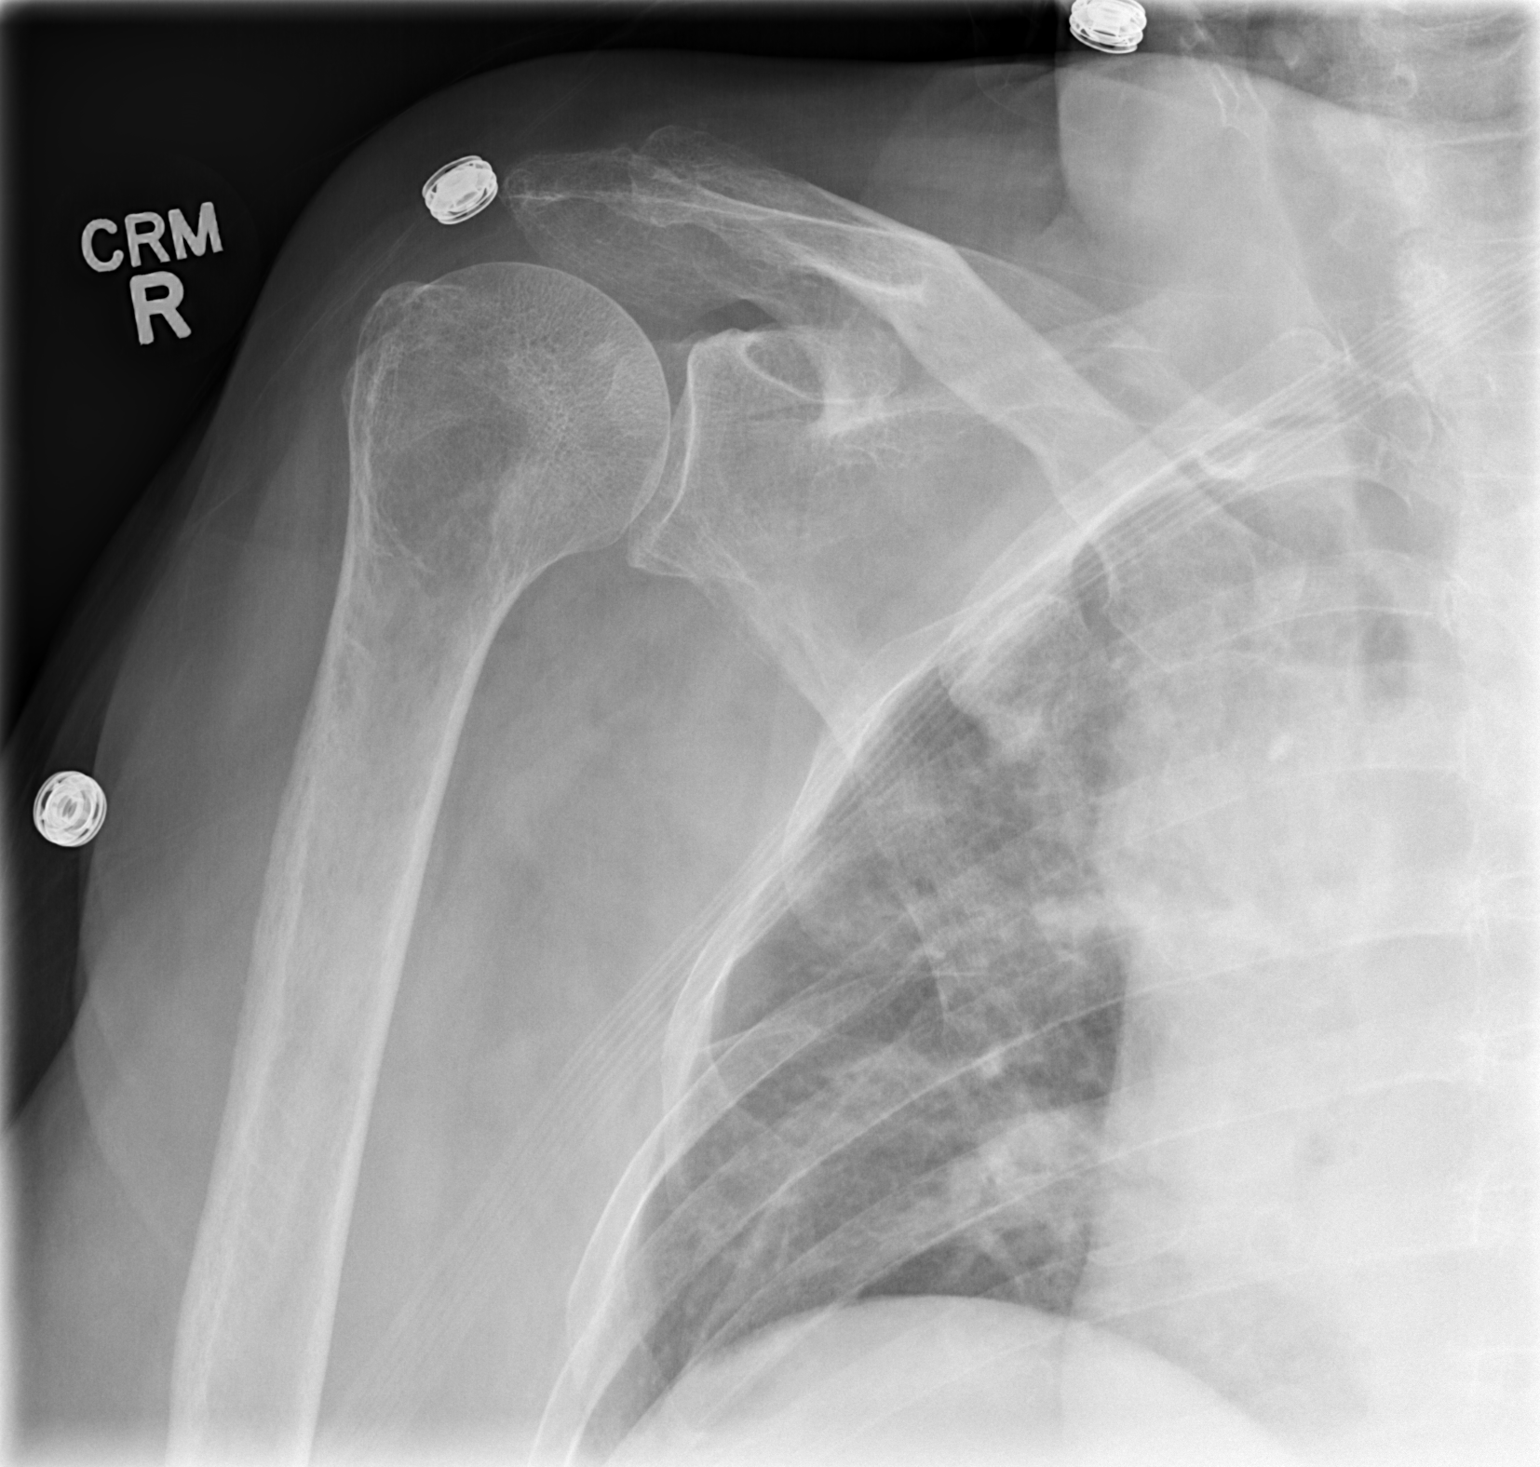

[t shoulder y view right]
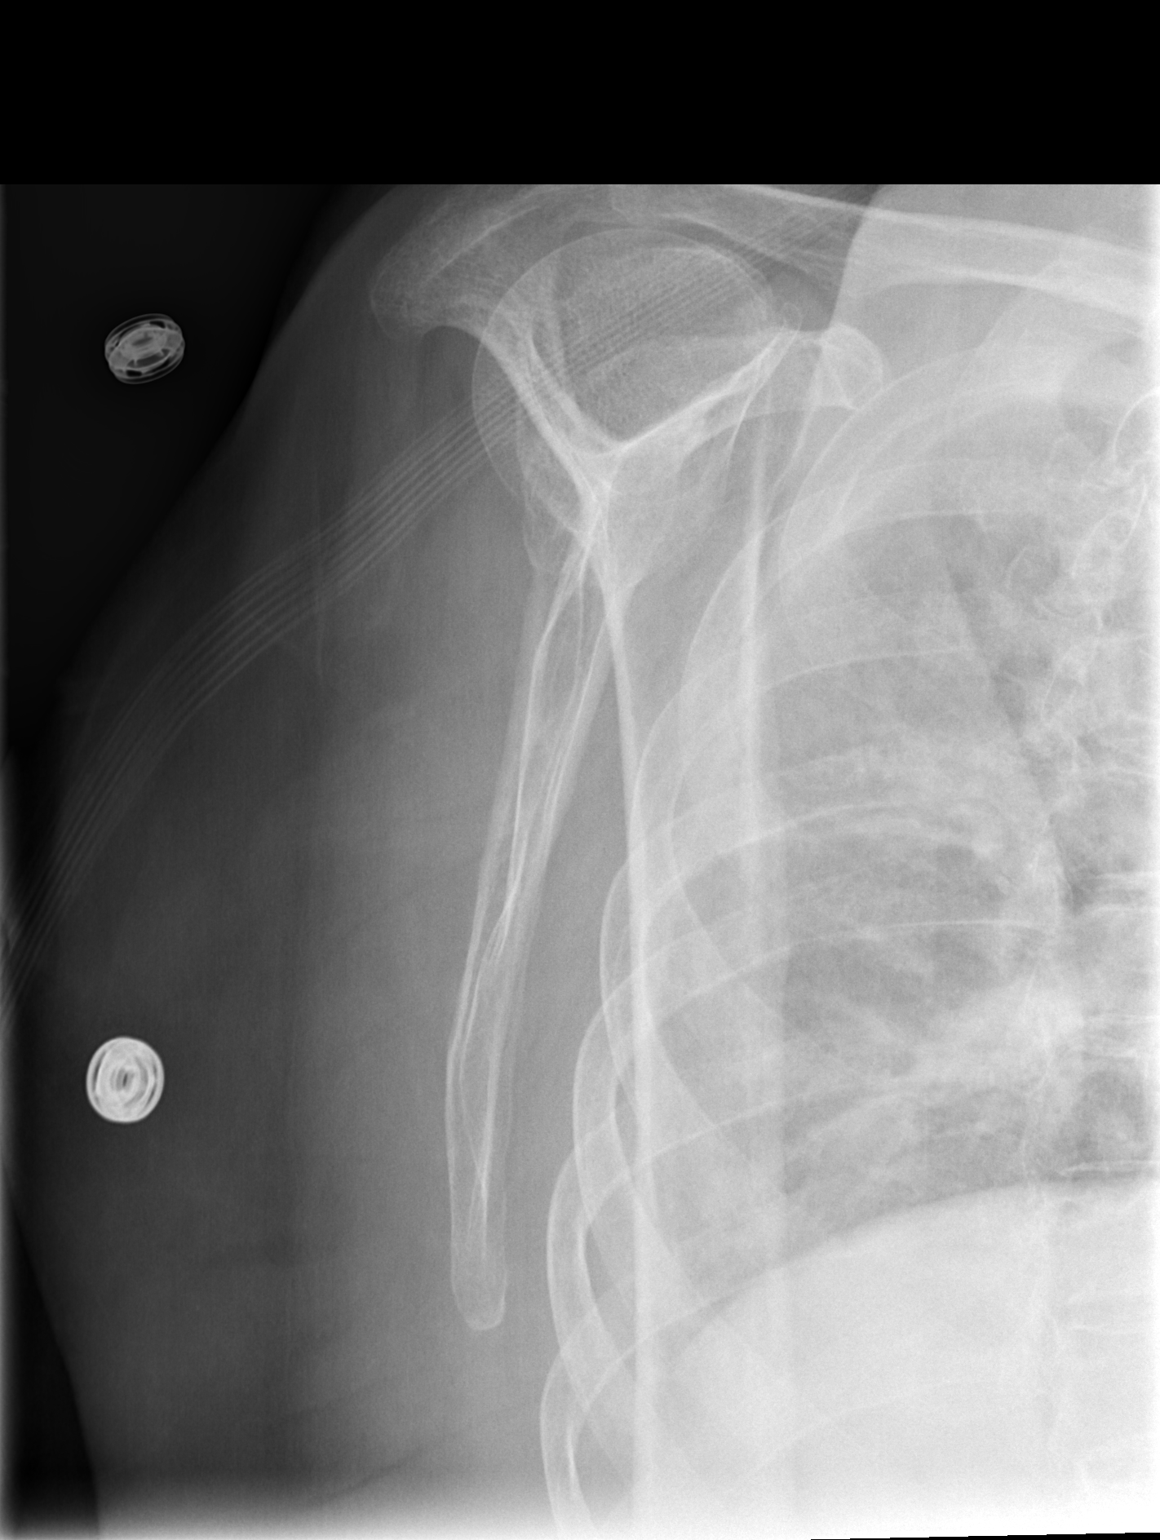

[2 of 2 positions shown; findings below may reference images not displayed]

FINDINGS: There is no fracture or dislocation or significant
arthritis.  There is osteopenia.
IMPRESSION: No acute abnormality.

## 2014-04-24 IMAGING — CT CT HEAD W/O CM
1 series · 15 of 30 positions shown, 19 images · non-contrast
Comparison: Brain MRI 09/22/2012 and earlier.

CLINICAL DATA: 72-year-old female with decreased level of
consciousness. Recent right brain infarct.

EXAM:
CT HEAD WITHOUT CONTRAST
TECHNIQUE: Contiguous axial images were obtained from the base of the skull
through the vertex without intravenous contrast.

[Series 2: head 5.0 h30s · axial · 0.45mm/px · z∈[-128,+7]mm · 15 of 30 slices shown, 19 images]
[im 2/30  brain]
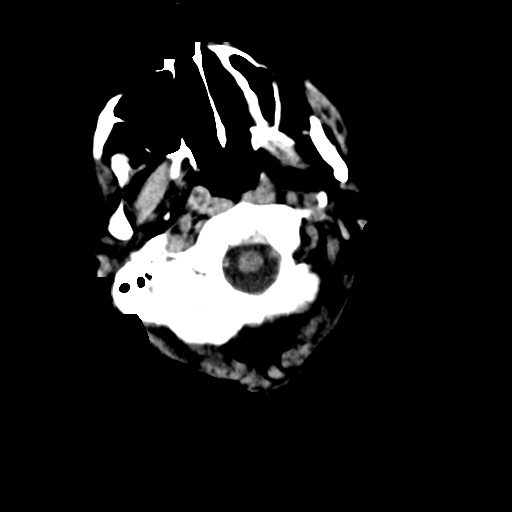
[im 2/30  bone]
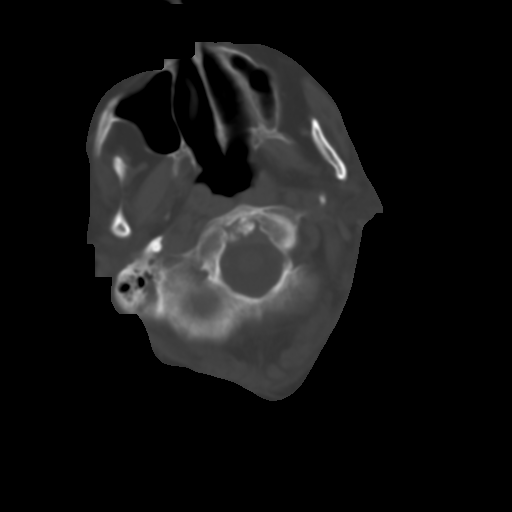
[im 4/30  brain]
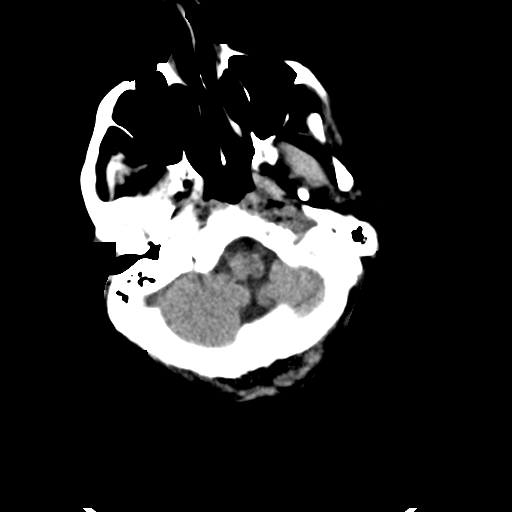
[im 6/30  brain]
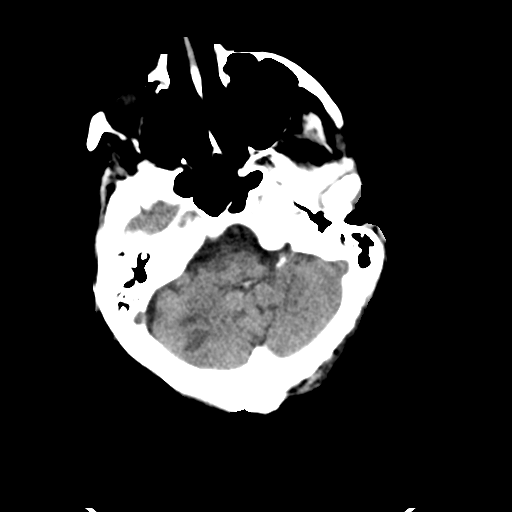
[im 8/30  brain]
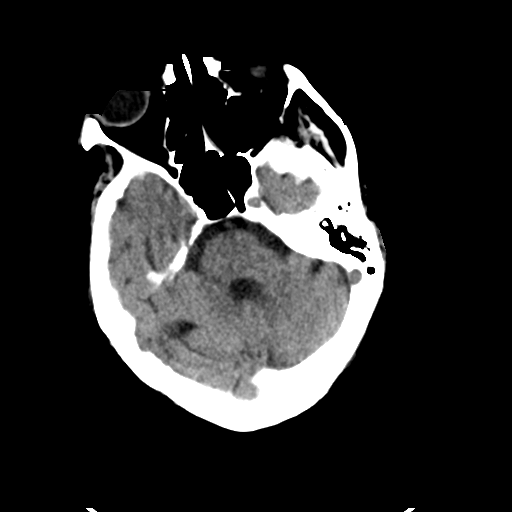
[im 10/30  brain]
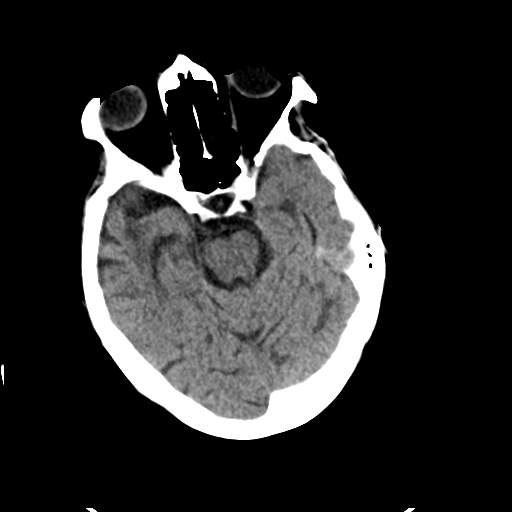
[im 10/30  bone]
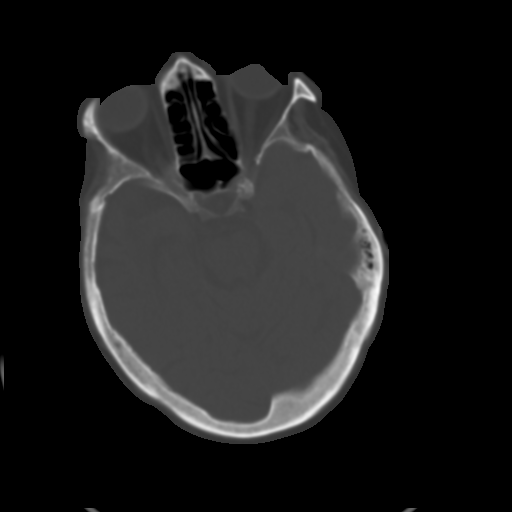
[im 12/30  brain]
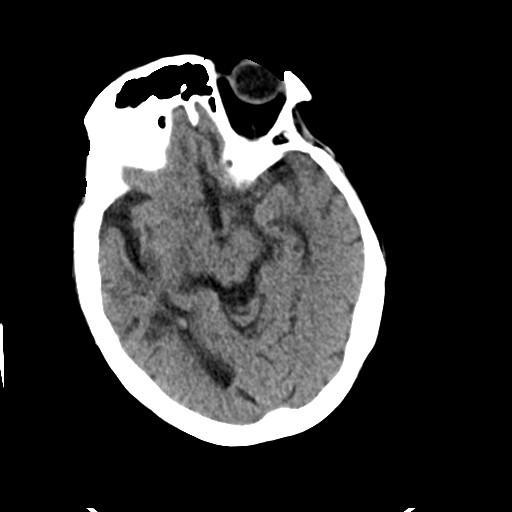
[im 14/30  brain]
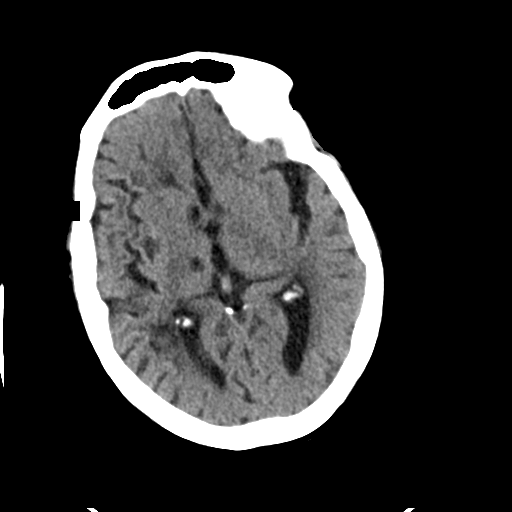
[im 16/30  brain]
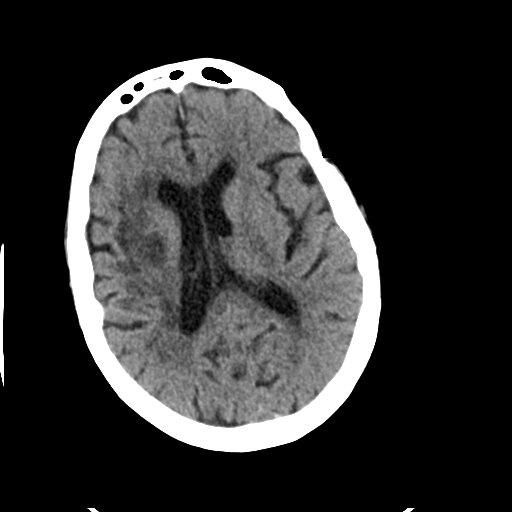
[im 17/30  brain]
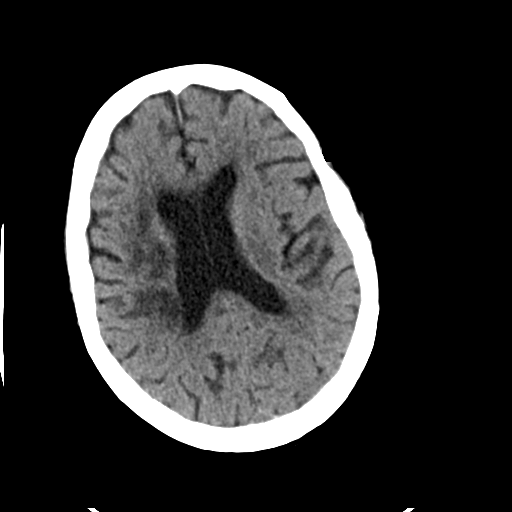
[im 17/30  bone]
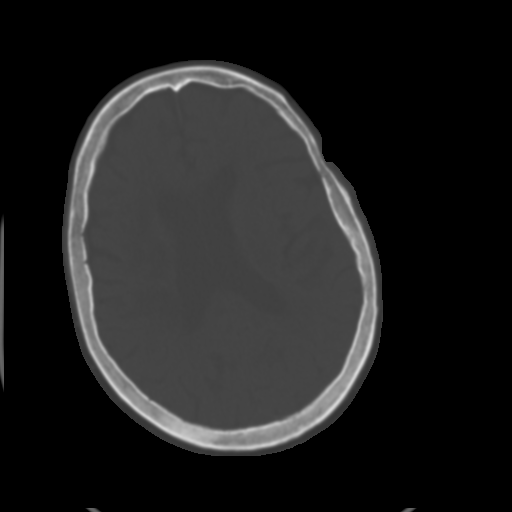
[im 19/30  brain]
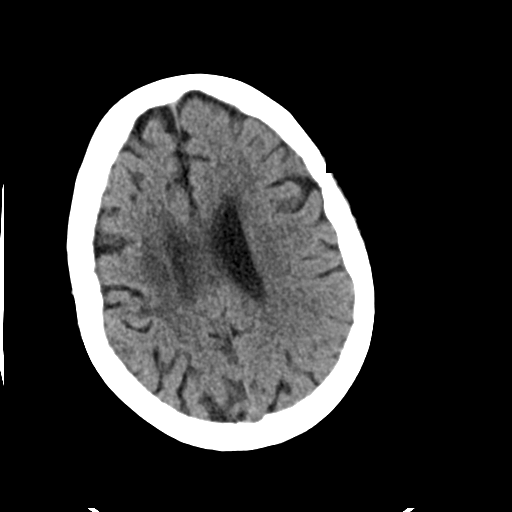
[im 21/30  brain]
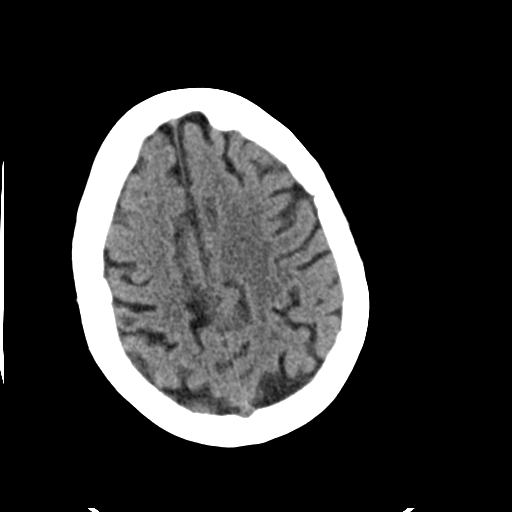
[im 23/30  brain]
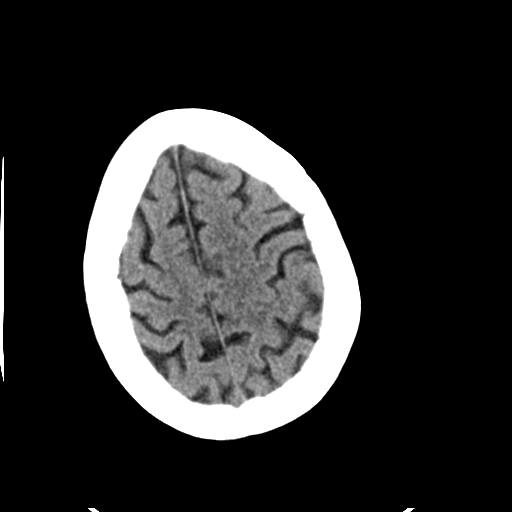
[im 25/30  brain]
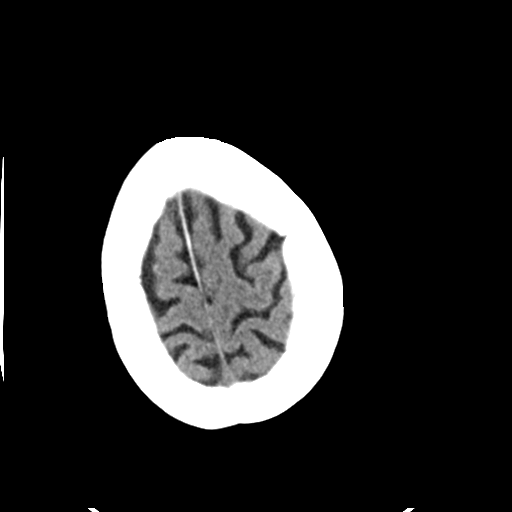
[im 25/30  bone]
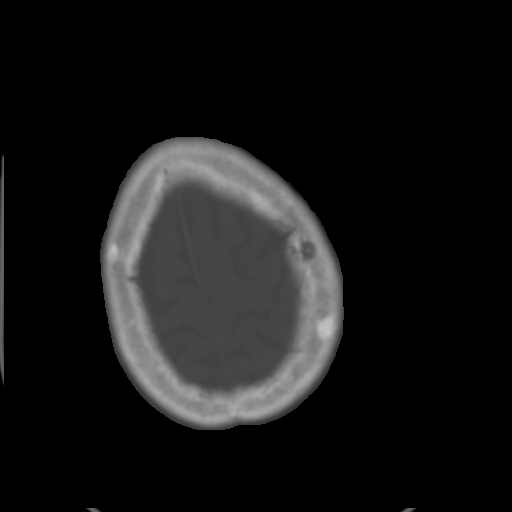
[im 27/30  brain]
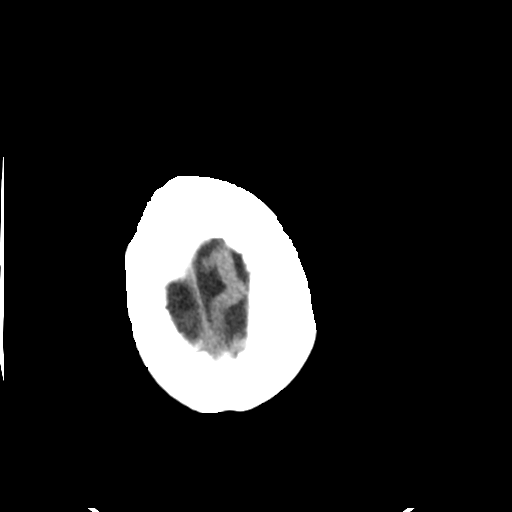
[im 29/30  brain]
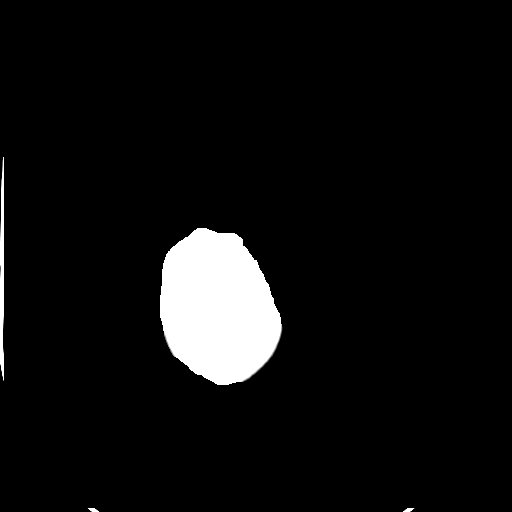

[15 of 30 positions shown; findings below may reference images not displayed]

FINDINGS: Visualized paranasal sinuses and mastoids are clear. No acute
osseous abnormality identified. Visualized orbits and scalp soft
tissues are within normal limits.

Interval expected evolution of hypodensity in the right MCA
territory. No associated mass effect. Chronic lacunar infarcts in
the right thalamus and right cerebellum. However, there is a new
focus of hypodensity in the inferior right cerebellum similar in
size to the chronic lesion (series 2, image 6). No associated mass
effect or hemorrhage. Elsewhere stable gray-white matter
differentiation in the posterior fossa.

Ventricular size and configuration are within normal limits. No
suspicious intracranial vascular hyperdensity.
IMPRESSION: 1. New acute/subacute right inferior cerebellar infarct, located
near the chronic infarct seen previously. No mass effect or
hemorrhage.

2. Expected evolution of the recent right MCA infarct.

## 2014-04-24 IMAGING — CR DG LUMBAR SPINE 2-3V
2 series · 2 of 2 positions shown · non-contrast
Comparison: CT scan of the abdomen and pelvis dated 07/08/2012

CLINICAL DATA: Back pain. History of metastatic rectal cancer.

LUMBAR SPINE - 2-3 VIEW

[t l-spine a.p.]
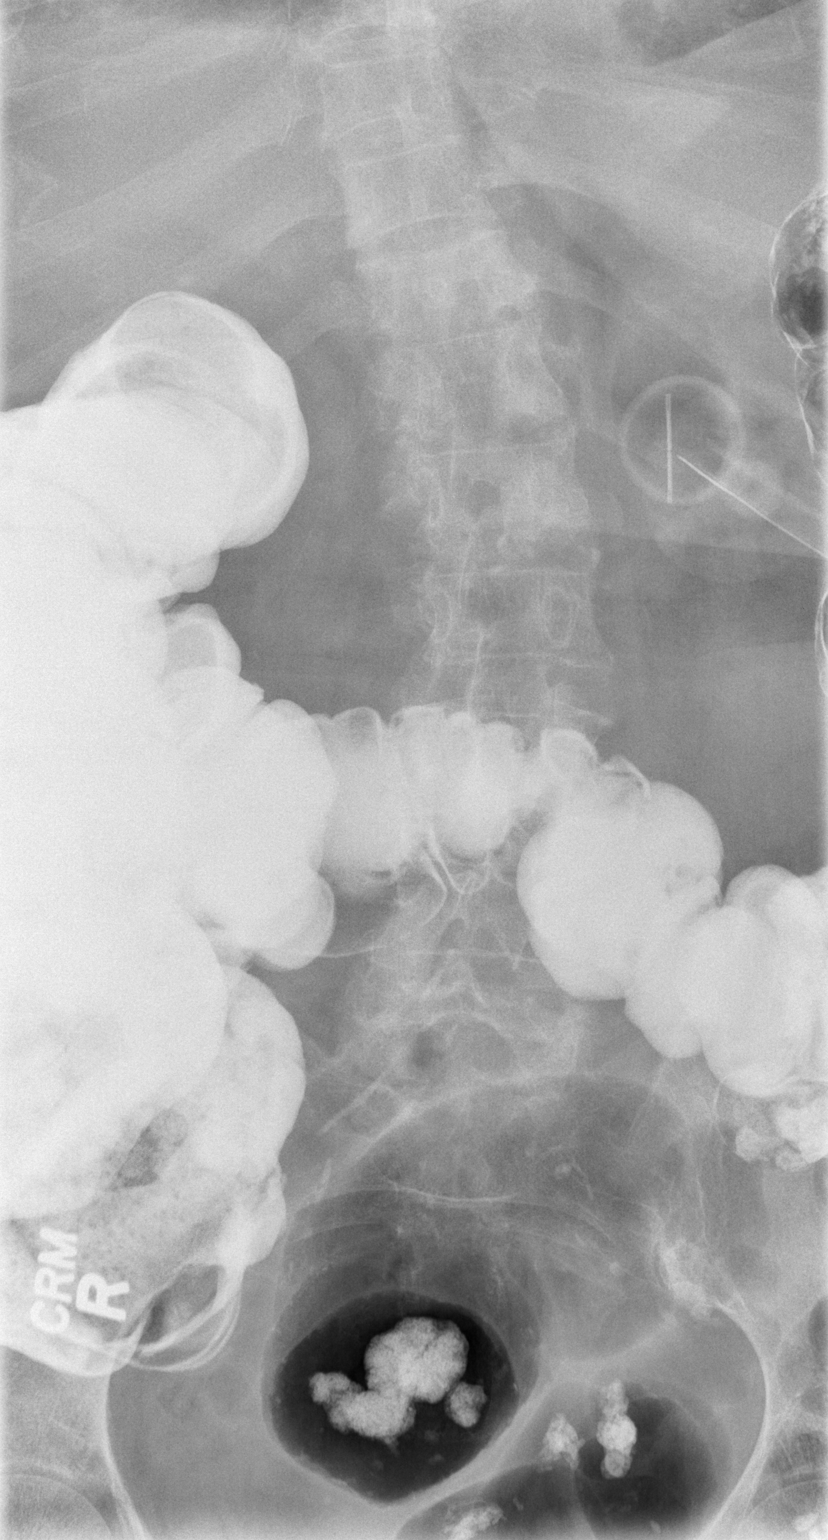

[w l-spine lat]
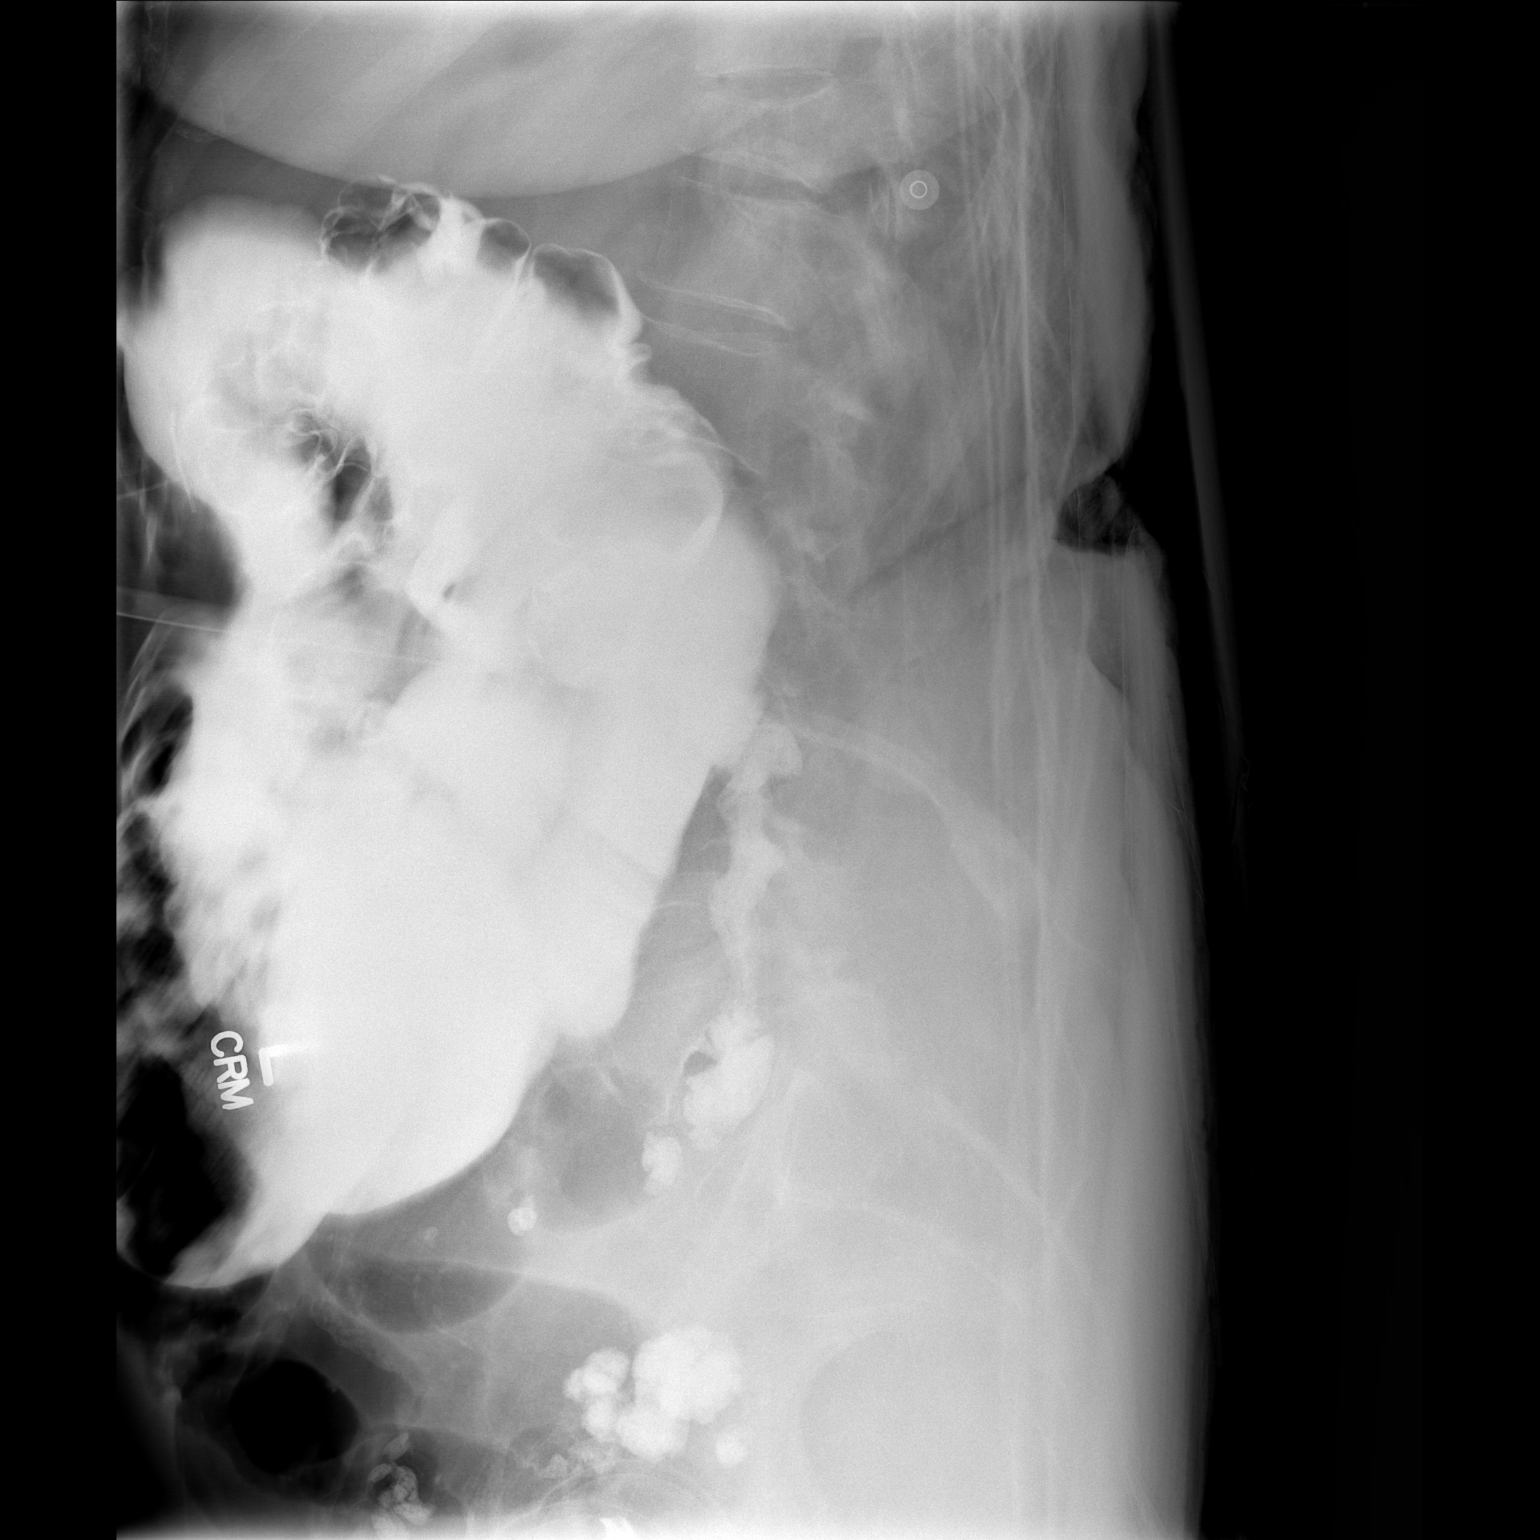

[2 of 2 positions shown; findings below may reference images not displayed]

FINDINGS: There is a slight lumbar scoliosis.  There is an
increased compression fracture of T11. Slight increased density at
the left facet joint of L1 which probably represents facet
arthritis.  Sclerotic metastasis could give the same appearance
however.
IMPRESSION: Sclerotic area in the L1 vertebra which may be facet joint
hypertrophy but sclerotic metastasis could have the same.
Scoliosis.
